# Patient Record
Sex: Female | Born: 1975 | Race: White | Hispanic: Yes | Marital: Married | Smoking: Former smoker
Health system: Southern US, Community
[De-identification: ages and names within clinical notes are randomized; demographics above are authoritative.]

## PROBLEM LIST (undated history)

## (undated) DIAGNOSIS — D509 Iron deficiency anemia, unspecified: Secondary | ICD-10-CM

## (undated) DIAGNOSIS — B019 Varicella without complication: Secondary | ICD-10-CM

## (undated) DIAGNOSIS — Z9289 Personal history of other medical treatment: Secondary | ICD-10-CM

## (undated) DIAGNOSIS — F419 Anxiety disorder, unspecified: Secondary | ICD-10-CM

## (undated) DIAGNOSIS — G4730 Sleep apnea, unspecified: Secondary | ICD-10-CM

## (undated) DIAGNOSIS — D649 Anemia, unspecified: Secondary | ICD-10-CM

## (undated) DIAGNOSIS — M1990 Unspecified osteoarthritis, unspecified site: Secondary | ICD-10-CM

## (undated) DIAGNOSIS — G500 Trigeminal neuralgia: Secondary | ICD-10-CM

## (undated) DIAGNOSIS — F329 Major depressive disorder, single episode, unspecified: Secondary | ICD-10-CM

## (undated) DIAGNOSIS — F32A Depression, unspecified: Secondary | ICD-10-CM

## (undated) HISTORY — DX: Varicella without complication: B01.9

## (undated) HISTORY — PX: ABDOMINAL HYSTERECTOMY: SHX81

---

## 2002-04-12 HISTORY — PX: ROUX-EN-Y GASTRIC BYPASS: SHX1104

## 2005-04-12 HISTORY — PX: CHOLECYSTECTOMY OPEN: SUR202

## 2010-04-12 NOTE — L&D Delivery Note (Signed)
Delivery Note At  a viable female was delivered via NSVD.  APGAR: 8, 9 ; weight 7# 1 oz.   Placenta status: intact.  Cord: 3 VC  with the following complications: none.  Anesthesia:  IV Nubain Lacerations: none Est. Blood Loss (mL):  <325mL  Mom to postpartum.  Baby to nursery-stable.  STINSON, JACOB JEHIEL 12/26/2010, 9:51 PM

## 2010-08-08 ENCOUNTER — Emergency Department (HOSPITAL_COMMUNITY)
Admission: EM | Admit: 2010-08-08 | Discharge: 2010-08-08 | Disposition: A | Discharge status: Home / Self Care | Payer: Medicaid Other | Attending: Emergency Medicine | Admitting: Emergency Medicine

## 2010-08-08 DIAGNOSIS — K089 Disorder of teeth and supporting structures, unspecified: Secondary | ICD-10-CM | POA: Insufficient documentation

## 2010-08-08 DIAGNOSIS — O99891 Other specified diseases and conditions complicating pregnancy: Secondary | ICD-10-CM | POA: Insufficient documentation

## 2010-08-08 DIAGNOSIS — K029 Dental caries, unspecified: Secondary | ICD-10-CM | POA: Insufficient documentation

## 2010-09-13 ENCOUNTER — Emergency Department (HOSPITAL_COMMUNITY): Payer: Medicaid Other

## 2010-09-13 ENCOUNTER — Emergency Department (HOSPITAL_COMMUNITY)
Admission: EM | Admit: 2010-09-13 | Discharge: 2010-09-14 | Disposition: A | Discharge status: Home / Self Care | Payer: Medicaid Other | Attending: Emergency Medicine | Admitting: Emergency Medicine

## 2010-09-13 DIAGNOSIS — O99019 Anemia complicating pregnancy, unspecified trimester: Secondary | ICD-10-CM | POA: Insufficient documentation

## 2010-09-13 DIAGNOSIS — R5381 Other malaise: Secondary | ICD-10-CM | POA: Insufficient documentation

## 2010-09-13 DIAGNOSIS — R42 Dizziness and giddiness: Secondary | ICD-10-CM | POA: Insufficient documentation

## 2010-09-13 DIAGNOSIS — D649 Anemia, unspecified: Secondary | ICD-10-CM | POA: Insufficient documentation

## 2010-09-13 DIAGNOSIS — R55 Syncope and collapse: Secondary | ICD-10-CM | POA: Insufficient documentation

## 2010-09-13 LAB — COMPREHENSIVE METABOLIC PANEL
ALT: 6 U/L (ref 0–35)
Albumin: 2.9 g/dL — ABNORMAL LOW (ref 3.5–5.2)
CO2: 23 mEq/L (ref 19–32)
Calcium: 8.7 mg/dL (ref 8.4–10.5)
GFR calc non Af Amer: >60 mL/min (ref >60)
Glucose, Bld: 104 mg/dL — ABNORMAL HIGH (ref 70–99)
Sodium: 137 mEq/L (ref 135–145)

## 2010-09-13 LAB — DIFFERENTIAL
Basophils Absolute: 0 10*3/uL (ref 0.0–0.1)
Basophils Relative: 0 % (ref 0–1)
Eosinophils Relative: 1 % (ref 0–5)
Monocytes Absolute: 0.7 10*3/uL (ref 0.1–1.0)

## 2010-09-13 LAB — CBC
MCHC: 29.3 g/dL — ABNORMAL LOW (ref 30.0–36.0)
MCV: 66.5 fL — ABNORMAL LOW (ref 78.0–100.0)
Platelets: 278 10*3/uL (ref 150–400)
RDW: 16.7 % — ABNORMAL HIGH (ref 11.5–15.5)

## 2010-09-13 LAB — ABO/RH: ABO/RH(D): A POS

## 2010-09-15 LAB — TYPE AND SCREEN
ABO/RH(D): A POS
Antibody Screen: NEGATIVE
Unit division: 0

## 2010-09-23 ENCOUNTER — Other Ambulatory Visit: Payer: Self-pay | Admitting: Family Medicine

## 2010-09-23 DIAGNOSIS — O99019 Anemia complicating pregnancy, unspecified trimester: Secondary | ICD-10-CM

## 2010-09-23 DIAGNOSIS — O09529 Supervision of elderly multigravida, unspecified trimester: Secondary | ICD-10-CM

## 2010-09-23 LAB — POCT URINALYSIS DIP (DEVICE)
Ketones, ur: NEGATIVE mg/dL
Leukocytes, UA: NEGATIVE
Protein, ur: NEGATIVE mg/dL
Urobilinogen, UA: 0.2 mg/dL (ref 0.0–1.0)

## 2010-09-23 LAB — HIV ANTIBODY (ROUTINE TESTING W REFLEX): HIV: NONREACTIVE

## 2010-09-23 LAB — RPR: RPR: NONREACTIVE

## 2010-09-28 ENCOUNTER — Other Ambulatory Visit: Payer: Medicaid Other

## 2010-09-28 DIAGNOSIS — Z0189 Encounter for other specified special examinations: Secondary | ICD-10-CM

## 2010-10-01 ENCOUNTER — Other Ambulatory Visit: Payer: Medicaid Other

## 2010-10-07 ENCOUNTER — Other Ambulatory Visit: Payer: Self-pay | Admitting: Obstetrics and Gynecology

## 2010-10-07 DIAGNOSIS — O09529 Supervision of elderly multigravida, unspecified trimester: Secondary | ICD-10-CM

## 2010-10-07 LAB — POCT URINALYSIS DIP (DEVICE)
Glucose, UA: NEGATIVE mg/dL
Leukocytes, UA: NEGATIVE
Nitrite: NEGATIVE
Specific Gravity, Urine: 1.015 (ref 1.005–1.030)
Urobilinogen, UA: 0.2 mg/dL (ref 0.0–1.0)

## 2010-10-21 ENCOUNTER — Other Ambulatory Visit: Payer: Self-pay | Admitting: Obstetrics and Gynecology

## 2010-10-21 DIAGNOSIS — O99019 Anemia complicating pregnancy, unspecified trimester: Secondary | ICD-10-CM

## 2010-10-21 LAB — POCT URINALYSIS DIP (DEVICE)
Glucose, UA: NEGATIVE mg/dL
Nitrite: NEGATIVE

## 2010-11-04 ENCOUNTER — Other Ambulatory Visit: Payer: Self-pay | Admitting: Family

## 2010-11-04 ENCOUNTER — Other Ambulatory Visit: Payer: Self-pay | Admitting: Physician Assistant

## 2010-11-04 DIAGNOSIS — O09529 Supervision of elderly multigravida, unspecified trimester: Secondary | ICD-10-CM

## 2010-11-04 DIAGNOSIS — O99019 Anemia complicating pregnancy, unspecified trimester: Secondary | ICD-10-CM

## 2010-11-04 DIAGNOSIS — Z3689 Encounter for other specified antenatal screening: Secondary | ICD-10-CM

## 2010-11-04 LAB — GLUCOSE, CAPILLARY: Glucose-Capillary: 73 mg/dL (ref 70–99)

## 2010-11-04 LAB — POCT URINALYSIS DIP (DEVICE)
Bilirubin Urine: NEGATIVE
Glucose, UA: 100 mg/dL — AB
Ketones, ur: NEGATIVE mg/dL

## 2010-11-04 LAB — WET PREP, GENITAL
Clue Cells Wet Prep HPF POC: NONE SEEN
Trich, Wet Prep: NONE SEEN

## 2010-11-09 ENCOUNTER — Ambulatory Visit (HOSPITAL_COMMUNITY)
Admission: RE | Admit: 2010-11-09 | Discharge: 2010-11-09 | Disposition: A | Discharge status: Home / Self Care | Payer: Medicaid Other | Source: Ambulatory Visit | Attending: Physician Assistant | Admitting: Physician Assistant

## 2010-11-09 DIAGNOSIS — Z363 Encounter for antenatal screening for malformations: Secondary | ICD-10-CM | POA: Insufficient documentation

## 2010-11-09 DIAGNOSIS — O358XX0 Maternal care for other (suspected) fetal abnormality and damage, not applicable or unspecified: Secondary | ICD-10-CM | POA: Insufficient documentation

## 2010-11-09 DIAGNOSIS — Z1389 Encounter for screening for other disorder: Secondary | ICD-10-CM | POA: Insufficient documentation

## 2010-11-09 DIAGNOSIS — O0930 Supervision of pregnancy with insufficient antenatal care, unspecified trimester: Secondary | ICD-10-CM | POA: Insufficient documentation

## 2010-11-09 DIAGNOSIS — Z3689 Encounter for other specified antenatal screening: Secondary | ICD-10-CM

## 2010-11-11 ENCOUNTER — Telehealth: Payer: Self-pay | Admitting: *Deleted

## 2010-11-11 NOTE — Telephone Encounter (Signed)
Patient called stating had dentist appointment tomorrow and needs address to dentist and referral. Telephoned patient and left message to return call.

## 2010-11-18 ENCOUNTER — Other Ambulatory Visit: Payer: Self-pay | Admitting: Obstetrics and Gynecology

## 2010-11-18 DIAGNOSIS — O219 Vomiting of pregnancy, unspecified: Secondary | ICD-10-CM

## 2010-11-18 DIAGNOSIS — O09529 Supervision of elderly multigravida, unspecified trimester: Secondary | ICD-10-CM

## 2010-11-18 LAB — POCT URINALYSIS DIP (DEVICE)
Hgb urine dipstick: NEGATIVE
Leukocytes, UA: NEGATIVE
Nitrite: POSITIVE — AB
Protein, ur: 100 mg/dL — AB
pH: 5.5 (ref 5.0–8.0)

## 2010-11-20 LAB — CULTURE, OB URINE

## 2010-11-25 ENCOUNTER — Other Ambulatory Visit: Payer: Self-pay | Admitting: Obstetrics & Gynecology

## 2010-11-25 ENCOUNTER — Other Ambulatory Visit: Payer: Self-pay | Admitting: Family Medicine

## 2010-11-25 DIAGNOSIS — M259 Joint disorder, unspecified: Secondary | ICD-10-CM

## 2010-11-25 DIAGNOSIS — M899 Disorder of bone, unspecified: Secondary | ICD-10-CM

## 2010-11-25 DIAGNOSIS — O9989 Other specified diseases and conditions complicating pregnancy, childbirth and the puerperium: Secondary | ICD-10-CM

## 2010-11-25 LAB — CBC
MCV: 71.4 fL — ABNORMAL LOW (ref 78.0–100.0)
Platelets: 234 10*3/uL (ref 150–400)
RBC: 3.7 MIL/uL — ABNORMAL LOW (ref 3.87–5.11)
RDW: 18.8 % — ABNORMAL HIGH (ref 11.5–15.5)
WBC: 4.7 10*3/uL (ref 4.0–10.5)

## 2010-11-25 LAB — POCT URINALYSIS DIP (DEVICE)
Bilirubin Urine: NEGATIVE
Glucose, UA: NEGATIVE mg/dL
Hgb urine dipstick: NEGATIVE
Nitrite: NEGATIVE

## 2010-11-25 LAB — GLUCOSE, CAPILLARY: Glucose-Capillary: 136 mg/dL — ABNORMAL HIGH (ref 70–99)

## 2010-11-26 LAB — GC/CHLAMYDIA PROBE AMP, GENITAL
Chlamydia, DNA Probe: NEGATIVE
GC Probe Amp, Genital: NEGATIVE

## 2010-11-27 LAB — STREP B DNA PROBE: GBSP: POSITIVE

## 2010-11-29 ENCOUNTER — Inpatient Hospital Stay (HOSPITAL_COMMUNITY)
Admission: AD | Admit: 2010-11-29 | Discharge: 2010-11-29 | Disposition: A | Discharge status: Home / Self Care | Payer: Medicaid Other | Source: Ambulatory Visit | Attending: Obstetrics & Gynecology | Admitting: Obstetrics & Gynecology

## 2010-11-29 ENCOUNTER — Encounter (HOSPITAL_COMMUNITY): Payer: Self-pay | Admitting: *Deleted

## 2010-11-29 DIAGNOSIS — IMO0002 Reserved for concepts with insufficient information to code with codable children: Secondary | ICD-10-CM | POA: Insufficient documentation

## 2010-11-29 HISTORY — DX: Anemia, unspecified: D64.9

## 2010-11-29 LAB — URINALYSIS, ROUTINE W REFLEX MICROSCOPIC
Bilirubin Urine: NEGATIVE
Ketones, ur: NEGATIVE mg/dL
Leukocytes, UA: NEGATIVE
Nitrite: NEGATIVE
Protein, ur: NEGATIVE mg/dL
Urobilinogen, UA: 0.2 mg/dL (ref 0.0–1.0)
pH: 6 (ref 5.0–8.0)

## 2010-11-29 NOTE — Progress Notes (Signed)
Pt reports left leg started swelling yesterday. Foot feels a littl numb up to her knee.

## 2010-11-29 NOTE — Progress Notes (Signed)
Dr. Elwyn Reach at bedside.  Assessment done and poc discussed with pt.

## 2010-11-29 NOTE — Progress Notes (Signed)
Resident notified of pt arrival and complaint of swelling in right foot.  Will be up to assess pt.

## 2010-11-29 NOTE — ED Provider Notes (Signed)
Chief Complaint:  Leg Swelling   Carrie Douglas is  35 y.o. G6P5005 at [redacted]w[redacted]d presents complaining of Leg Swelling . She states occasional contractions, none currently; denies leaking fluid, vaginal bleeding.  Good fetal movemement. Her right foot has been more swollen than her left for the last 3 days, and in the last day has developed some numbness and tingling of the right toes.  Does consume a lot of salt in her diet.  Denies pain in the right leg, no fevers, or redness of the affected foot.  Does have an aunt who had a blood clot.  Obstetrical/Gynecological History: OB History    Grav Para Term Preterm Abortions TAB SAB Ect Mult Living   6 5 5  0 0 0 0 0 0 5      Past Medical History: Past Medical History  Diagnosis Date  . Anemia     Past Surgical History: Past Surgical History  Procedure Date  . Gastric bypass     Family History: No family history on file.  Social History: History  Substance Use Topics  . Smoking status: Never Smoker   . Smokeless tobacco: Not on file  . Alcohol Use: No    Allergies: No Known Allergies  Prescriptions prior to admission  Medication Sig Dispense Refill  . Cyanocobalamin (VITAMIN B-12 PO) Take 1 tablet by mouth every morning.       . prenatal vitamin w/FE, FA (PRENATAL 1 + 1) 27-1 MG TABS Take 1 tablet by mouth daily.          Review of Systems - Negative except for HPI  Physical Exam   Blood pressure 114/70, pulse 74, temperature 98.3 F (36.8 C), temperature source Oral, resp. rate 18, height 5\' 6"  (1.676 m), weight 238 lb (107.956 kg).  General: General appearance - alert, well appearing, and in no distress Mouth - mucous membranes moist, pharynx normal without lesions Chest - clear to auscultation, no wheezes, rales or rhonchi, symmetric air entry Heart - normal rate, regular rhythm, normal S1, S2, no murmurs, rubs, clicks or gallops Abdomen - soft, nontender, nondistended, gravid (size consistent with dates) Back exam -  no CVA tenderness Extremities - pedal edema is noted bilaterally, worse on right.  Negative Homan's sign. 2+ pedal pulses Baseline: 135 bpm, Variability: Good {> 6 bpm), Accelerations: Reactive and Decelerations: Absent Contractions: none   Labs: Recent Results (from the past 24 hour(s))  URINALYSIS, ROUTINE W REFLEX MICROSCOPIC   Collection Time   11/29/10  7:03 PM      Component Value Range   Color, Urine YELLOW  YELLOW    Appearance CLEAR  CLEAR    Specific Gravity, Urine 1.020  1.005 - 1.030    pH 6.0  5.0 - 8.0    Glucose, UA NEGATIVE  NEGATIVE (mg/dL)   Hgb urine dipstick NEGATIVE  NEGATIVE    Bilirubin Urine NEGATIVE  NEGATIVE    Ketones, ur NEGATIVE  NEGATIVE (mg/dL)   Protein, ur NEGATIVE  NEGATIVE (mg/dL)   Urobilinogen, UA 0.2  0.0 - 1.0 (mg/dL)   Nitrite NEGATIVE  NEGATIVE    Leukocytes, UA NEGATIVE  NEGATIVE    Imaging Studies:  US Ob Detail + 14 Wk  11/09/2010  OBSTETRICAL ULTRASOUND: This exam was performed within a [facility]. The OB US report was generated in the [facility] system, and faxed to the ordering physician.   This report is also available in [employer] and in the [employer]. See [facility] Obstetric  US report.     Assessment: Chau Savell is  35 y.o. G6P5005 at [redacted]w[redacted]d presents with pedal edema, R >L.  Plan: -Likely due to high salt diet and fetal positioning -Discussed decreasing salt intake -instructed her to sit with feet up -instructed her to lay on a L tilt to help distribute baby's weight -Discharge home  BOOTH, ERIN8/19/20127:51 PM

## 2010-11-29 NOTE — Progress Notes (Signed)
Pt presents to [facility] for increased swelling in right foot. Started yesterday but wasn't as bad.  Go worse today and has a tingling feeling.

## 2010-12-02 ENCOUNTER — Other Ambulatory Visit: Payer: Self-pay | Admitting: Family Medicine

## 2010-12-02 DIAGNOSIS — O99019 Anemia complicating pregnancy, unspecified trimester: Secondary | ICD-10-CM

## 2010-12-02 LAB — POCT URINALYSIS DIP (DEVICE)
Leukocytes, UA: NEGATIVE
Nitrite: NEGATIVE
Protein, ur: NEGATIVE mg/dL
Urobilinogen, UA: 0.2 mg/dL (ref 0.0–1.0)
pH: 6.5 (ref 5.0–8.0)

## 2010-12-02 LAB — GLUCOSE, CAPILLARY: Glucose-Capillary: 83 mg/dL (ref 70–99)

## 2010-12-09 ENCOUNTER — Other Ambulatory Visit: Payer: Self-pay | Admitting: Obstetrics and Gynecology

## 2010-12-09 DIAGNOSIS — O99019 Anemia complicating pregnancy, unspecified trimester: Secondary | ICD-10-CM

## 2010-12-09 DIAGNOSIS — O09529 Supervision of elderly multigravida, unspecified trimester: Secondary | ICD-10-CM

## 2010-12-09 DIAGNOSIS — IMO0002 Reserved for concepts with insufficient information to code with codable children: Secondary | ICD-10-CM

## 2010-12-09 LAB — POCT URINALYSIS DIP (DEVICE)
Protein, ur: NEGATIVE mg/dL
Urobilinogen, UA: 1 mg/dL (ref 0.0–1.0)
pH: 6 (ref 5.0–8.0)

## 2010-12-15 DIAGNOSIS — O09529 Supervision of elderly multigravida, unspecified trimester: Secondary | ICD-10-CM

## 2010-12-15 DIAGNOSIS — O99019 Anemia complicating pregnancy, unspecified trimester: Secondary | ICD-10-CM | POA: Insufficient documentation

## 2010-12-15 DIAGNOSIS — N390 Urinary tract infection, site not specified: Secondary | ICD-10-CM | POA: Insufficient documentation

## 2010-12-17 ENCOUNTER — Inpatient Hospital Stay (HOSPITAL_COMMUNITY)
Admission: AD | Admit: 2010-12-17 | Discharge: 2010-12-17 | Disposition: A | Discharge status: Home / Self Care | Payer: Medicaid Other | Source: Ambulatory Visit | Attending: Obstetrics & Gynecology | Admitting: Obstetrics & Gynecology

## 2010-12-17 ENCOUNTER — Encounter (HOSPITAL_COMMUNITY): Payer: Self-pay | Admitting: *Deleted

## 2010-12-17 DIAGNOSIS — N390 Urinary tract infection, site not specified: Secondary | ICD-10-CM

## 2010-12-17 DIAGNOSIS — O99019 Anemia complicating pregnancy, unspecified trimester: Secondary | ICD-10-CM | POA: Insufficient documentation

## 2010-12-17 DIAGNOSIS — O09529 Supervision of elderly multigravida, unspecified trimester: Secondary | ICD-10-CM | POA: Insufficient documentation

## 2010-12-17 DIAGNOSIS — B372 Candidiasis of skin and nail: Secondary | ICD-10-CM | POA: Insufficient documentation

## 2010-12-17 DIAGNOSIS — Z22330 Carrier of Group B streptococcus: Secondary | ICD-10-CM | POA: Insufficient documentation

## 2010-12-17 DIAGNOSIS — D649 Anemia, unspecified: Secondary | ICD-10-CM | POA: Insufficient documentation

## 2010-12-17 DIAGNOSIS — Z0371 Encounter for suspected problem with amniotic cavity and membrane ruled out: Secondary | ICD-10-CM | POA: Insufficient documentation

## 2010-12-17 MED ORDER — NYSTATIN 100000 UNIT/GM EX OINT: TOPICAL_OINTMENT | Freq: Two times a day (BID) | CUTANEOUS | Status: DC

## 2010-12-17 NOTE — ED Provider Notes (Signed)
Carrie Douglas is a 35 y.o. female presenting for r/u SROM. History Patient states had some bloody show last night after sex with husband, and has had continued brownish red streaks in mucous discharge today.  Has been having contractions for the last week or so, stronger today but still spaced out.  No vaginal bleeding.  Good fetal movement.  Also has an itchy rash on her abdomen that has developed over the last several days.  OB History    Grav Para Term Preterm Abortions TAB SAB Ect Mult Living   6 5 5  0 0 0 0 0 0 5     Past Medical History  Diagnosis Date  . Anemia   . Rubella     5 months old  . Varicella     77 1/36 years old   Past Surgical History  Procedure Date  . Gastric bypass   . No past surgeries   . Laparoscopic tubal ligation 2007   Family History: family history includes Cancer in her father. Social History:  reports that she has never smoked. She has never used smokeless tobacco. She reports that she does not drink alcohol or use illicit drugs.  Review of Systems  All other systems reviewed and are negative.    Dilation: 1.5 Effacement (%): 50 Station: -2 Exam by:: Jerelyn Charles  MD Blood pressure 134/83, pulse 84, temperature 98.5 F (36.9 C), temperature source Oral, resp. rate 20, height 5' 4.5" (1.638 m), weight 239 lb 6.4 oz (108.591 kg), SpO2 99.00%. Maternal Exam:  Uterine Assessment: Contraction strength is mild.  Contraction frequency is irregular and rare.   Abdomen: Patient reports no abdominal tenderness. Fetal presentation: vertex  Introitus: Normal vulva. Normal vagina.  Ferning test: negative.   Pelvis: adequate for delivery.   Cervix: Cervix evaluated by digital exam.    FHT: 135, moderate variability, accels present, decels absent  Physical Exam  Constitutional: She is oriented to person, place, and time. She appears well-developed and well-nourished. No distress.  HENT:  Head: Normocephalic and atraumatic.  Eyes: No scleral icterus.    Neck: Normal range of motion. Neck supple.  GI: Soft.       Gravid with size consistent with dates  Musculoskeletal: She exhibits edema (3+ pedal edema).  Neurological: She is alert and oriented to person, place, and time.  Skin: Rash (red patchy rash on abdomen) noted.    Prenatal labs: ABO, Rh: --/--/A POS, A POS (06/03 1845) Antibody: NEG (06/03 1845) Rubella:  immune RPR: Nonreactive (06/13 0000)  HBsAg: NEGATIVE (08/08 0929)  HIV: Non-reactive (06/13 0000)  GBS: POSITIVE (08/15 1914)   Assessment/Plan: 35 year old G6P5005 at 34.2 presenting for rule out SROM and candidal skin rash. -AMA -mutiparity -GBS positive  Not in active labor; will discharge home with labor precautions. Will also provide nystatin ointment for likely candidal skin rash   Patient seen and discussed with Dr. Natale Milch. BOOTH, Shalinda Burkholder 12/17/2010, 7:22 PM

## 2010-12-17 NOTE — Progress Notes (Signed)
Pt states she has had a mucus discharge with some blood streaks. Has contractions about every 8 minutes. Reports good fetal movement.

## 2010-12-18 NOTE — ED Provider Notes (Signed)
I was present for evaluation of this patient. Fern slide negative. Agree with documentation.

## 2010-12-23 ENCOUNTER — Ambulatory Visit (INDEPENDENT_AMBULATORY_CARE_PROVIDER_SITE_OTHER): Payer: Medicaid Other | Admitting: Physician Assistant

## 2010-12-23 ENCOUNTER — Other Ambulatory Visit: Payer: Self-pay | Admitting: Obstetrics and Gynecology

## 2010-12-23 DIAGNOSIS — O480 Post-term pregnancy: Secondary | ICD-10-CM

## 2010-12-23 DIAGNOSIS — O09529 Supervision of elderly multigravida, unspecified trimester: Secondary | ICD-10-CM

## 2010-12-23 LAB — POCT URINALYSIS DIP (DEVICE)
Hgb urine dipstick: NEGATIVE
Ketones, ur: NEGATIVE mg/dL
Specific Gravity, Urine: >=1.03 (ref 1.005–1.030)
pH: 6 (ref 5.0–8.0)

## 2010-12-23 NOTE — Progress Notes (Signed)
Reports increased ctx last night. MAU visit over the weekend cvx: 1.5/thick/vtx. NST today for postdates. Will schedule IOL for next week for postdates.

## 2010-12-23 NOTE — Progress Notes (Signed)
Pressure-groin, pelvic. Pt thinks she lost mucous plug.

## 2010-12-25 ENCOUNTER — Inpatient Hospital Stay (HOSPITAL_COMMUNITY)
Admission: AD | Admit: 2010-12-25 | Discharge: 2010-12-25 | Disposition: A | Discharge status: Home / Self Care | Payer: Medicaid Other | Source: Ambulatory Visit | Attending: Family Medicine | Admitting: Family Medicine

## 2010-12-25 DIAGNOSIS — O479 False labor, unspecified: Secondary | ICD-10-CM | POA: Insufficient documentation

## 2010-12-25 MED ORDER — LACTATED RINGERS IV BOLUS (SEPSIS)
1000.0000 mL | Freq: Once | INTRAVENOUS | Status: AC
  Administered 2010-12-25: 1000 mL via INTRAVENOUS

## 2010-12-25 NOTE — Progress Notes (Signed)
Dr. Elesa Massed reviewed strip during phone call for orders.

## 2010-12-25 NOTE — Progress Notes (Signed)
Pt presents to mau for labor check.  efm and toco placed. Assessing.  

## 2010-12-25 NOTE — Progress Notes (Signed)
Dr. Elesa Massed notified of no cervical change.  Notified of fluid bolus complete with reactive strip and no more decels.  Orders received to dc home.  Follow up with regular scheduled appointment.

## 2010-12-25 NOTE — Progress Notes (Signed)
Difficulty tracing contractions due to pt size.  Contraction frequency per pt statement.

## 2010-12-25 NOTE — Progress Notes (Cosign Needed)
Pt G6 P5 at 40.3wks, contracting every 2-67minx 1-2 hrs.  Denies any problems with pregnancy.

## 2010-12-25 NOTE — Progress Notes (Signed)
Dr. Elesa Massed notified of pt presenting for labor check. Notified of VE.  Notified of fetal decels and repositioning pt.  Orders received for fluids.  See orders.

## 2010-12-26 ENCOUNTER — Encounter (HOSPITAL_COMMUNITY): Payer: Self-pay | Admitting: *Deleted

## 2010-12-26 ENCOUNTER — Inpatient Hospital Stay (HOSPITAL_COMMUNITY)
Admission: AD | Admit: 2010-12-26 | Discharge: 2010-12-28 | DRG: 775 | Disposition: A | Discharge status: Home / Self Care | Payer: Medicaid Other | Source: Ambulatory Visit | Attending: Obstetrics and Gynecology | Admitting: Obstetrics and Gynecology

## 2010-12-26 DIAGNOSIS — O9989 Other specified diseases and conditions complicating pregnancy, childbirth and the puerperium: Secondary | ICD-10-CM

## 2010-12-26 DIAGNOSIS — O99844 Bariatric surgery status complicating childbirth: Secondary | ICD-10-CM

## 2010-12-26 DIAGNOSIS — O09529 Supervision of elderly multigravida, unspecified trimester: Principal | ICD-10-CM | POA: Diagnosis present

## 2010-12-26 DIAGNOSIS — O99892 Other specified diseases and conditions complicating childbirth: Secondary | ICD-10-CM | POA: Diagnosis present

## 2010-12-26 DIAGNOSIS — Z22330 Carrier of Group B streptococcus: Secondary | ICD-10-CM

## 2010-12-26 LAB — CBC
HCT: 24.7 % — ABNORMAL LOW (ref 36.0–46.0)
Hemoglobin: 7.3 g/dL — ABNORMAL LOW (ref 12.0–15.0)
MCH: 21.1 pg — ABNORMAL LOW (ref 26.0–34.0)
MCHC: 29.6 g/dL — ABNORMAL LOW (ref 30.0–36.0)
MCV: 71.4 fL — ABNORMAL LOW (ref 78.0–100.0)
Platelets: 239 10*3/uL (ref 150–400)
RBC: 3.46 MIL/uL — ABNORMAL LOW (ref 3.87–5.11)
RDW: 17.9 % — ABNORMAL HIGH (ref 11.5–15.5)
WBC: 9.8 10*3/uL (ref 4.0–10.5)

## 2010-12-26 MED ORDER — OXYTOCIN 20 UNITS IN LACTATED RINGERS INFUSION - SIMPLE
125.0000 mL/h | Freq: Once | INTRAVENOUS | Status: DC
  Administered 2010-12-26: 500 mL/h via INTRAVENOUS

## 2010-12-26 MED ORDER — LACTATED RINGERS IV SOLN
INTRAVENOUS | Status: DC
  Administered 2010-12-26: 18:00:00 via INTRAVENOUS

## 2010-12-26 MED ORDER — LIDOCAINE HCL (PF) 1 % IJ SOLN
30.0000 mL | INTRAMUSCULAR | Status: DC | PRN
  Filled 2010-12-26 (×2): qty 30

## 2010-12-26 MED ORDER — SODIUM CHLORIDE 0.9 % IV SOLN
2.0000 g | Freq: Once | INTRAVENOUS | Status: DC
  Administered 2010-12-26: 2 g via INTRAVENOUS
  Filled 2010-12-26: qty 2000

## 2010-12-26 MED ORDER — OXYCODONE-ACETAMINOPHEN 5-325 MG PO TABS: 2.0000 | ORAL_TABLET | ORAL | Status: DC | PRN

## 2010-12-26 MED ORDER — ACETAMINOPHEN 325 MG PO TABS: 650.0000 mg | ORAL_TABLET | ORAL | Status: DC | PRN

## 2010-12-26 MED ORDER — MISOPROSTOL 200 MCG PO TABS
ORAL_TABLET | ORAL | Status: AC
  Filled 2010-12-26: qty 5

## 2010-12-26 MED ORDER — FLEET ENEMA 7-19 GM/118ML RE ENEM: 1.0000 | ENEMA | RECTAL | Status: DC | PRN

## 2010-12-26 MED ORDER — OXYTOCIN 10 UNIT/ML IJ SOLN
INTRAMUSCULAR | Status: AC
  Administered 2010-12-26: 20 [IU]
  Filled 2010-12-26: qty 2

## 2010-12-26 MED ORDER — OXYTOCIN BOLUS FROM INFUSION
500.0000 mL | Freq: Once | INTRAVENOUS | Status: DC
  Filled 2010-12-26: qty 500
  Filled 2010-12-26: qty 1000

## 2010-12-26 MED ORDER — IBUPROFEN 600 MG PO TABS
600.0000 mg | ORAL_TABLET | Freq: Four times a day (QID) | ORAL | Status: DC | PRN
  Filled 2010-12-26 (×2): qty 1

## 2010-12-26 MED ORDER — NALBUPHINE SYRINGE 5 MG/0.5 ML
5.0000 mg | INJECTION | INTRAMUSCULAR | Status: DC | PRN
  Administered 2010-12-26: 5 mg via INTRAVENOUS
  Filled 2010-12-26 (×2): qty 0.5

## 2010-12-26 MED ORDER — ONDANSETRON HCL 4 MG/2ML IJ SOLN: 4.0000 mg | Freq: Four times a day (QID) | INTRAMUSCULAR | Status: DC | PRN

## 2010-12-26 MED ORDER — CITRIC ACID-SODIUM CITRATE 334-500 MG/5ML PO SOLN: 30.0000 mL | ORAL | Status: DC | PRN

## 2010-12-26 MED ORDER — SODIUM CHLORIDE 0.9 % IV SOLN
1.0000 g | INTRAVENOUS | Status: DC
  Filled 2010-12-26 (×6): qty 1000

## 2010-12-26 MED ORDER — LACTATED RINGERS IV SOLN: 500.0000 mL | INTRAVENOUS | Status: DC | PRN

## 2010-12-26 NOTE — ED Provider Notes (Signed)
Carrie Douglas is a 35 y.o. female presenting for SROM and contractions. Maternal Medical History:  Reason for admission: Reason for admission: rupture of membranes and contractions.  Contractions: Onset was 1 week ago.   Frequency: regular.   Duration is approximately 30 seconds.   Perceived severity is moderate.    Fetal activity: Perceived fetal activity is normal.   Last perceived fetal movement was within the past hour.    Prenatal complications: No bleeding, HIV, hypertension, infection, pre-eclampsia or preterm labor.   Prenatal Complications - Diabetes: none.    OB History    Grav Para Term Preterm Abortions TAB SAB Ect Mult Living   6 5 5  0 0 0 0 0 0 5     Past Medical History  Diagnosis Date  . Anemia   . Rubella     5 months old  . Varicella     36 1/36 years old   Past Surgical History  Procedure Date  . Gastric bypass   . No past surgeries   . Laparoscopic tubal ligation 2007   Family History: family history includes Cancer in her father. Social History:  reports that she has never smoked. She has never used smokeless tobacco. She reports that she does not drink alcohol or use illicit drugs.  Review of Systems  All other systems reviewed and are negative.    Dilation: 5 Effacement (%): 30 Station: -2 There were no vitals taken for this visit. Maternal Exam:  Uterine Assessment: Contraction strength is moderate.  Contraction duration is 30 seconds. Contraction frequency is regular.   Abdomen: Patient reports no abdominal tenderness. Fundal height is size equals dates.   Estimated fetal weight is 7.5 lbs.   Fetal presentation: vertex  Introitus: Vulva is negative for condylomata, edema, lesion, piercings and varicosities.  Ferning test: not done.  Nitrazine test: not done. Amniotic fluid character: clear.  Pelvis: adequate for delivery.   Cervix: Cervix evaluated by digital exam.     Fetal Exam Fetal Monitor Review: Mode: hand-held doppler probe.    Variability: moderate (6-25 bpm).   Pattern: accelerations present and no decelerations.    Fetal State Assessment: Category I - tracings are normal.     Physical Exam  Constitutional: She appears well-developed and well-nourished.  HENT:  Head: Normocephalic and atraumatic.  Eyes: Pupils are equal, round, and reactive to light.  Neck: Normal range of motion.  Cardiovascular: Normal rate and regular rhythm.   Respiratory: Effort normal and breath sounds normal.  GI: Soft. Bowel sounds are normal.  Genitourinary: Vulva exhibits no lesion.    Prenatal labs: ABO, Rh: --/--/A POS, A POS (06/03 1845) Antibody: NEG (06/03 1845) Rubella:   RPR: Nonreactive (06/13 0000)  HBsAg: NEGATIVE (08/08 0929)  HIV: Non-reactive (06/13 0000)  GBS: POSITIVE (08/15 0916)   Assessment/Plan: 1.  Labor Will admit patient and support pt during labor. 2.  GBS - will start with ampicillin due to history of rapid delivery  STINSON, JACOB JEHIEL 12/26/2010, 5:13 PM

## 2010-12-26 NOTE — Plan of Care (Signed)
Problem: Consults Goal: Birthing Suites Patient Information Press F2 to bring up selections list  Outcome: Completed/Met Date Met:  12/26/10  Pt > [redacted] weeks EGA

## 2010-12-26 NOTE — Progress Notes (Signed)
Subjective: Pt admitted for SROM and contractions.  Doing well with nubain.  Comfortable.  Objective: BP 138/68  Pulse 70  Temp(Src) 98.3 F (36.8 C) (Oral)  Resp 20  Ht 5\' 6"  (1.676 m)  Wt 108.863 kg (240 lb)  BMI 38.74 kg/m2      FHT:  FHR: 120s bpm, variability: moderate,  accelerations:  Abscent,  decelerations:  Present lates UC:   regular, every 3 minutes SVE:   Dilation: 5 Effacement (%): 30 Station: -2  Labs: Lab Results  Component Value Date   WBC 9.8 12/26/2010   HGB 7.3* 12/26/2010   HCT 24.7* 12/26/2010   MCV 71.4* 12/26/2010   PLT 239 12/26/2010    Assessment / Plan: Spontaneous labor, progressing normally ROM - meconium Labor: Progressing normally Preeclampsia:  n/a Fetal Wellbeing:  Category II Pain Control:  nubain I/D:  Ampicillin for GBS Anticipated MOD:  NSVD  STINSON, JACOB JEHIEL 12/26/2010, 9:14 PM

## 2010-12-26 NOTE — H&P (Signed)
Carrie Douglas is a 35 y.o. female presenting for SROM and contractions. Maternal Medical History:  Reason for admission: Reason for admission: rupture of membranes and contractions.  Contractions: Onset was 1 week ago.   Frequency: regular.   Duration is approximately 30 seconds.   Perceived severity is moderate.    Fetal activity: Perceived fetal activity is normal.   Last perceived fetal movement was within the past hour.    Prenatal complications: No bleeding, HIV, hypertension, infection, pre-eclampsia or preterm labor.   Prenatal Complications - Diabetes: none.    OB History    Grav Para Term Preterm Abortions TAB SAB Ect Mult Living   6 5 5 0 0 0 0 0 0 5     Past Medical History  Diagnosis Date  . Anemia   . Rubella     5 months old  . Varicella     1 1/36 years old   Past Surgical History  Procedure Date  . Gastric bypass   . No past surgeries   . Laparoscopic tubal ligation 2007   Family History: family history includes Cancer in her father. Social History:  reports that she has never smoked. She has never used smokeless tobacco. She reports that she does not drink alcohol or use illicit drugs.  Review of Systems  All other systems reviewed and are negative.    Dilation: 5 Effacement (%): 30 Station: -2 There were no vitals taken for this visit. Maternal Exam:  Uterine Assessment: Contraction strength is moderate.  Contraction duration is 30 seconds. Contraction frequency is regular.   Abdomen: Patient reports no abdominal tenderness. Fundal height is size equals dates.   Estimated fetal weight is 7.5 lbs.   Fetal presentation: vertex  Introitus: Vulva is negative for condylomata, edema, lesion, piercings and varicosities.  Ferning test: not done.  Nitrazine test: not done. Amniotic fluid character: clear.  Pelvis: adequate for delivery.   Cervix: Cervix evaluated by digital exam.     Fetal Exam Fetal Monitor Review: Mode: hand-held doppler probe.    Variability: moderate (6-25 bpm).   Pattern: accelerations present and no decelerations.    Fetal State Assessment: Category I - tracings are normal.     Physical Exam  Constitutional: She appears well-developed and well-nourished.  HENT:  Head: Normocephalic and atraumatic.  Eyes: Pupils are equal, round, and reactive to light.  Neck: Normal range of motion.  Cardiovascular: Normal rate and regular rhythm.   Respiratory: Effort normal and breath sounds normal.  GI: Soft. Bowel sounds are normal.  Genitourinary: Vulva exhibits no lesion.    Prenatal labs: ABO, Rh: --/--/A POS, A POS (06/03 1845) Antibody: NEG (06/03 1845) Rubella:   RPR: Nonreactive (06/13 0000)  HBsAg: NEGATIVE (08/08 0929)  HIV: Non-reactive (06/13 0000)  GBS: POSITIVE (08/15 0916)   Assessment/Plan: 1.  Labor Will admit patient and support pt during labor. 2.  GBS - will start with ampicillin due to history of rapid delivery  Minette Manders JEHIEL 12/26/2010, 5:13 PM 

## 2010-12-26 NOTE — Progress Notes (Signed)
Dr. Adrian Blackwater attempted Fetal Scalp electrode x 2 without success.  Successful placement by Scheryl Marten, RN at this time.

## 2010-12-26 NOTE — ED Notes (Signed)
Dr Adrian Blackwater at bedside, SVE done

## 2010-12-26 NOTE — Progress Notes (Signed)
Pt states, " I've had regular contractions since 3 pm and I began leaking at 4 pm."

## 2010-12-27 ENCOUNTER — Encounter (HOSPITAL_COMMUNITY): Payer: Self-pay

## 2010-12-27 LAB — CBC
HCT: 21.3 % — ABNORMAL LOW (ref 36.0–46.0)
Hemoglobin: 6.2 g/dL — CL (ref 12.0–15.0)
MCH: 20.6 pg — ABNORMAL LOW (ref 26.0–34.0)
MCV: 70.8 fL — ABNORMAL LOW (ref 78.0–100.0)
Platelets: 204 10*3/uL (ref 150–400)
RBC: 3.01 MIL/uL — ABNORMAL LOW (ref 3.87–5.11)

## 2010-12-27 MED ORDER — DIBUCAINE 1 % RE OINT: 1.0000 "application " | TOPICAL_OINTMENT | RECTAL | Status: DC | PRN

## 2010-12-27 MED ORDER — BENZOCAINE-MENTHOL 20-0.5 % EX AERO: 1.0000 "application " | INHALATION_SPRAY | CUTANEOUS | Status: DC | PRN

## 2010-12-27 MED ORDER — DIPHENHYDRAMINE HCL 25 MG PO CAPS: 25.0000 mg | ORAL_CAPSULE | Freq: Four times a day (QID) | ORAL | Status: DC | PRN

## 2010-12-27 MED ORDER — PRENATAL PLUS 27-1 MG PO TABS
1.0000 | ORAL_TABLET | Freq: Every day | ORAL | Status: DC
  Administered 2010-12-27 – 2010-12-28 (×2): 1 via ORAL
  Filled 2010-12-27 (×2): qty 1

## 2010-12-27 MED ORDER — METHYLERGONOVINE MALEATE 0.2 MG/ML IJ SOLN: 0.2000 mg | INTRAMUSCULAR | Status: DC | PRN

## 2010-12-27 MED ORDER — BENZOCAINE-MENTHOL 20-0.5 % EX AERO
INHALATION_SPRAY | CUTANEOUS | Status: AC
  Filled 2010-12-27: qty 56

## 2010-12-27 MED ORDER — LACTATED RINGERS IV SOLN: INTRAVENOUS | Status: DC

## 2010-12-27 MED ORDER — METHYLERGONOVINE MALEATE 0.2 MG PO TABS: 0.2000 mg | ORAL_TABLET | ORAL | Status: DC | PRN

## 2010-12-27 MED ORDER — ZOLPIDEM TARTRATE 5 MG PO TABS: 5.0000 mg | ORAL_TABLET | Freq: Every evening | ORAL | Status: DC | PRN

## 2010-12-27 MED ORDER — SODIUM CHLORIDE 0.9 % IV SOLN
INTRAVENOUS | Status: DC
  Administered 2010-12-27: 13:00:00 via INTRAVENOUS

## 2010-12-27 MED ORDER — LANOLIN HYDROUS EX OINT: TOPICAL_OINTMENT | CUTANEOUS | Status: DC | PRN

## 2010-12-27 MED ORDER — TETANUS-DIPHTH-ACELL PERTUSSIS 5-2.5-18.5 LF-MCG/0.5 IM SUSP
0.5000 mL | Freq: Once | INTRAMUSCULAR | Status: AC
  Administered 2010-12-27: 0.5 mL via INTRAMUSCULAR
  Filled 2010-12-27: qty 0.5

## 2010-12-27 MED ORDER — ONDANSETRON HCL 4 MG/2ML IJ SOLN: 4.0000 mg | INTRAMUSCULAR | Status: DC | PRN

## 2010-12-27 MED ORDER — VITAMIN B-12 1000 MCG PO TABS
1000.0000 ug | ORAL_TABLET | Freq: Every day | ORAL | Status: DC
  Administered 2010-12-27 – 2010-12-28 (×2): 1000 ug via ORAL
  Filled 2010-12-27 (×2): qty 1

## 2010-12-27 MED ORDER — SIMETHICONE 80 MG PO CHEW: 80.0000 mg | CHEWABLE_TABLET | ORAL | Status: DC | PRN

## 2010-12-27 MED ORDER — OXYCODONE-ACETAMINOPHEN 5-325 MG PO TABS
1.0000 | ORAL_TABLET | ORAL | Status: DC | PRN
  Administered 2010-12-28: 1 via ORAL
  Filled 2010-12-27 (×2): qty 1

## 2010-12-27 MED ORDER — FERUMOXYTOL INJECTION 510 MG/17 ML
510.0000 mg | Freq: Once | INTRAVENOUS | Status: AC
  Administered 2010-12-27: 510 mg via INTRAVENOUS
  Filled 2010-12-27: qty 17

## 2010-12-27 MED ORDER — ONDANSETRON HCL 4 MG PO TABS: 4.0000 mg | ORAL_TABLET | ORAL | Status: DC | PRN

## 2010-12-27 MED ORDER — WITCH HAZEL-GLYCERIN EX PADS: 1.0000 "application " | MEDICATED_PAD | CUTANEOUS | Status: DC | PRN

## 2010-12-27 MED ORDER — NA FERRIC GLUC CPLX IN SUCROSE 12.5 MG/ML IV SOLN: 125.0000 mg | Freq: Once | INTRAVENOUS | Status: DC

## 2010-12-27 MED ORDER — IBUPROFEN 600 MG PO TABS
600.0000 mg | ORAL_TABLET | Freq: Four times a day (QID) | ORAL | Status: DC
  Administered 2010-12-27 – 2010-12-28 (×6): 600 mg via ORAL
  Filled 2010-12-27 (×4): qty 1

## 2010-12-27 MED ORDER — SENNOSIDES-DOCUSATE SODIUM 8.6-50 MG PO TABS
2.0000 | ORAL_TABLET | Freq: Every day | ORAL | Status: DC
  Administered 2010-12-27: 2 via ORAL

## 2010-12-27 NOTE — Progress Notes (Signed)
Post Partum Day 1 Subjective: no complaints, up ad lib, voiding and tolerating PO  Objective: Blood pressure 120/70, pulse 76, temperature 98.6 F (37 C), temperature source Oral, resp. rate 18, height 5\' 6"  (1.676 m), weight 108.863 kg (240 lb), unknown if currently breastfeeding.  Physical Exam:  General: alert, cooperative, appears stated age and no distress Lochia: appropriate Uterine Fundus: firm Incision: n/a DVT Evaluation: No evidence of DVT seen on physical exam. Negative Homan's sign. No cords or calf tenderness. No significant calf/ankle edema.   Basename 12/27/10 0519 12/26/10 1800  HGB 6.2* 7.3*  HCT 21.3* 24.7*    Assessment/Plan: Plan for discharge tomorrow   LOS: 1 day   Carrie Douglas 12/27/2010, 8:52 AM

## 2010-12-28 ENCOUNTER — Other Ambulatory Visit: Payer: Medicaid Other

## 2010-12-28 MED ORDER — IBUPROFEN 600 MG PO TABS: 600.0000 mg | ORAL_TABLET | Freq: Four times a day (QID) | ORAL | Status: AC

## 2010-12-28 MED ORDER — DOCUSATE SODIUM 100 MG PO CAPS: 100.0000 mg | ORAL_CAPSULE | Freq: Two times a day (BID) | ORAL | Status: AC

## 2010-12-28 NOTE — Discharge Summary (Signed)
Obstetric Discharge Summary Reason for Admission: onset of labor Prenatal Procedures: NST Intrapartum Procedures: spontaneous vaginal delivery Postpartum Procedures: none Complications-Operative and Postpartum: anemia Hemoglobin  Date Value Range Status  12/27/2010 6.2* 12.0-15.0 (g/dL) Final     DELTA CHECK NOTED     REPEATED TO VERIFY     CRITICAL RESULT CALLED TO, READ BACK BY AND VERIFIED WITH:     N HALLIS 12/27/10 AT 0548 BY H SOEWARDIMAN     HCT  Date Value Range Status  12/27/2010 21.3* 36.0-46.0 (%) Final    Discharge Diagnoses: Term Pregnancy-delivered  Discharge Information: Date: 12/28/2010 Activity: pelvic rest Diet: routine Medications: PNV, Ibuprophen, Colace and Iron Condition: stable Instructions: refer to practice specific booklet Discharge to: home   Newborn Data: Live born female  Birth Weight: 7 lb 1.4 oz (3215 g) APGAR: 8, 9  Home with mother.  Sandara Tyree, Beverely Pace A 12/28/2010, 7:31 AM

## 2010-12-28 NOTE — Progress Notes (Signed)
UR chart review completed.  

## 2010-12-30 ENCOUNTER — Inpatient Hospital Stay (HOSPITAL_COMMUNITY): Admission: RE | Admit: 2010-12-30 | Payer: Medicaid Other | Source: Ambulatory Visit

## 2011-02-03 ENCOUNTER — Encounter: Payer: Self-pay | Admitting: Advanced Practice Midwife

## 2011-02-03 ENCOUNTER — Ambulatory Visit (INDEPENDENT_AMBULATORY_CARE_PROVIDER_SITE_OTHER): Payer: Medicaid Other | Admitting: Advanced Practice Midwife

## 2011-02-03 DIAGNOSIS — Z3049 Encounter for surveillance of other contraceptives: Secondary | ICD-10-CM

## 2011-02-03 MED ORDER — MEDROXYPROGESTERONE ACETATE 150 MG/ML IM SUSP
150.0000 mg | Freq: Once | INTRAMUSCULAR | Status: AC
  Administered 2011-02-03: 150 mg via INTRAMUSCULAR

## 2011-02-03 NOTE — Progress Notes (Signed)
  Subjective:     Carrie Douglas is a 35 y.o. female who presents for a postpartum visit. She is 6 weeks postpartum following a spontaneous vaginal delivery. I have fully reviewed the prenatal and intrapartum course. The delivery was at 40 gestational weeks. Outcome: spontaneous vaginal delivery. Anesthesia: none. Postpartum course has been uneventful. Baby's course has been uneventful. Baby is feeding by both breast and bottle - unknown. Bleeding staining only. Bowel function is normal. Bladder function is normal. Patient is not sexually active. Contraception method is Depo-Provera injections. Postpartum depression screening: negative.  The following portions of the patient's history were reviewed and updated as appropriate: allergies, current medications, past family history, past medical history, past social history, past surgical history and problem list.  Review of Systems Pertinent items are noted in HPI.   Objective:    BP 118/79  Pulse 80  Temp 98.9 F (37.2 C)  Ht 5\' 6"  (1.676 m)  Wt 211 lb 1.6 oz (95.754 kg)  BMI 34.07 kg/m2  Breastfeeding? Yes  General:  alert, no distress and moderately obese   Breasts:  inspection negative, no nipple discharge or bleeding, no masses or nodularity palpable  Lungs: deferred  Heart:  def  Abdomen: soft, non-tender; bowel sounds normal; no masses,  no organomegaly   Vulva:  normal  Vagina: normal vagina  Cervix:  no lesions  Corpus: normal and normal size, contour, position, consistency, mobility, non-tender  Adnexa:  normal adnexa  Rectal Exam: deferred        Assessment:    Normal  postpartum exam. Pap smear not done at today's visit. Wants to schedule for another time.  Plan:    1. Contraception: Depo-Provera injections 2. Wants info on Mirena 3. Follow up in: 4 weeks or as needed.

## 2011-02-03 NOTE — Patient Instructions (Addendum)

## 2011-02-04 LAB — POCT PREGNANCY, URINE: Preg Test, Ur: NEGATIVE

## 2011-03-11 ENCOUNTER — Ambulatory Visit: Payer: Medicaid Other | Admitting: Advanced Practice Midwife

## 2011-04-29 ENCOUNTER — Telehealth: Payer: Self-pay | Admitting: *Deleted

## 2011-04-29 NOTE — Telephone Encounter (Signed)
Pt left message stating that her medicaid has expired. She wants information on how to get the shot cheaper now that she has no insurance.

## 2011-04-30 NOTE — Telephone Encounter (Signed)
Called pt and left message to return our call to the clinics.  

## 2011-05-03 ENCOUNTER — Ambulatory Visit: Payer: Medicaid Other

## 2011-05-03 NOTE — Telephone Encounter (Signed)
Called pt and left message stated that this is our second attempt and if she has any further questions, comments, and concerns to please call our nurse call back line.

## 2011-08-02 ENCOUNTER — Encounter (HOSPITAL_COMMUNITY): Payer: Self-pay | Admitting: *Deleted

## 2011-08-02 ENCOUNTER — Emergency Department (HOSPITAL_COMMUNITY)
Admission: EM | Admit: 2011-08-02 | Discharge: 2011-08-02 | Disposition: A | Discharge status: Home / Self Care | Payer: Medicaid Other | Attending: Emergency Medicine | Admitting: Emergency Medicine

## 2011-08-02 ENCOUNTER — Emergency Department (HOSPITAL_COMMUNITY): Payer: Medicaid Other

## 2011-08-02 DIAGNOSIS — R51 Headache: Secondary | ICD-10-CM | POA: Insufficient documentation

## 2011-08-02 DIAGNOSIS — G500 Trigeminal neuralgia: Secondary | ICD-10-CM | POA: Insufficient documentation

## 2011-08-02 DIAGNOSIS — H9209 Otalgia, unspecified ear: Secondary | ICD-10-CM | POA: Insufficient documentation

## 2011-08-02 LAB — COMPREHENSIVE METABOLIC PANEL
Albumin: 4 g/dL (ref 3.5–5.2)
Alkaline Phosphatase: 78 U/L (ref 39–117)
BUN: 14 mg/dL (ref 6–23)
Calcium: 9.3 mg/dL (ref 8.4–10.5)
Creatinine, Ser: 0.87 mg/dL (ref 0.50–1.10)
Potassium: 4.2 mEq/L (ref 3.5–5.1)
Total Protein: 7.1 g/dL (ref 6.0–8.3)

## 2011-08-02 LAB — CBC
HCT: 33.1 % — ABNORMAL LOW (ref 36.0–46.0)
MCH: 24.4 pg — ABNORMAL LOW (ref 26.0–34.0)
MCHC: 31.7 g/dL (ref 30.0–36.0)
RDW: 13.9 % (ref 11.5–15.5)

## 2011-08-02 MED ORDER — CARBAMAZEPINE 200 MG PO TABS: 200.0000 mg | ORAL_TABLET | Freq: Two times a day (BID) | ORAL | Status: DC

## 2011-08-02 MED ORDER — CARBAMAZEPINE 200 MG PO TABS
200.0000 mg | ORAL_TABLET | Freq: Once | ORAL | Status: AC
  Administered 2011-08-02: 200 mg via ORAL
  Filled 2011-08-02: qty 1

## 2011-08-02 NOTE — ED Provider Notes (Signed)
History     CSN: [id number]  Arrival date & time 08/02/11  1853   None     Chief Complaint  Patient presents with  . Headache    (Consider location/radiation/quality/duration/timing/severity/associated sxs/prior treatment) HPI Comments: Patient reports, that for the last year.  She has had excruciating electric-like sharp pain around her right ear that shoots to the corner of her eye and into her jaw.  She has had dental extractions, thinking that this was a tooth problem.  She has seen.  No primary care physician.  Other than the dentist for this.  She states that the pain is exacerbated by a breeze, touch, certain activities and cold temperatures.   Patient is a 36 y.o. female presenting with headaches. The history is provided by the patient.  Headache  This is a recurrent problem. The current episode started more than 1 week ago. The problem occurs every few minutes. The problem has been gradually worsening. The headache is associated with an unknown factor. The pain is located in the right unilateral region. The quality of the pain is described as throbbing. The pain is at a severity of 5/10. The pain is moderate. The pain radiates to the face. Pertinent negatives include no fever.    Past Medical History  Diagnosis Date  . Rubella     5 months old  . Varicella     59 1/36 years old  . Anemia     blood transfusion 09/14/10    Past Surgical History  Procedure Date  . Gastric bypass   . No past surgeries   . Laparoscopic tubal ligation 2007    Family History  Problem Relation Age of Onset  . Cancer Father     History  Substance Use Topics  . Smoking status: Never Smoker   . Smokeless tobacco: Never Used  . Alcohol Use: No    OB History    Grav Para Term Preterm Abortions TAB SAB Ect Mult Living   6 6 6  0 0 0 0 0 0 6      Review of Systems  Constitutional: Negative for fever.  HENT: Positive for ear pain. Negative for hearing loss, neck pain, neck stiffness,  dental problem and ear discharge.   Neurological: Positive for headaches. Negative for dizziness, seizures, facial asymmetry, speech difficulty, weakness and numbness.    Allergies  Review of patient's allergies indicates no known allergies.  Home Medications   Current Outpatient Rx  Name Route Sig Dispense Refill  . FERROUS GLUCONATE 325 MG PO TABS Oral Take 325 mg by mouth daily with breakfast.      . ADULT MULTIVITAMIN W/MINERALS CH Oral Take 1 tablet by mouth daily.    Marland Kitchen NAPROXEN SODIUM 220 MG PO TABS Oral Take 440 mg by mouth 2 (two) times daily with a meal.    . CARBAMAZEPINE 200 MG PO TABS Oral Take 1 tablet (200 mg total) by mouth 2 (two) times daily. 90 tablet 0    200 mg BID fro 7 days than increase to 400 mg BID  .Marland KitchenMarland Kitchen    BP 117/73  Pulse 64  Temp(Src) 98.7 F (37.1 C) (Oral)  Resp 16  SpO2 98%  LMP 07/19/2011  Physical Exam  Constitutional: She is oriented to person, place, and time. She appears well-developed and well-nourished.  HENT:  Head: Normocephalic and atraumatic.  Right Ear: Hearing, tympanic membrane and ear canal normal. No lacerations. There is tenderness. No mastoid tenderness. Tympanic membrane is not injected. No  middle ear effusion. No hemotympanum.  Left Ear: Hearing, tympanic membrane, external ear and ear canal normal.  Neck: Normal range of motion.  Cardiovascular: Normal rate.   Pulmonary/Chest: Effort normal.  Musculoskeletal: Normal range of motion.  Neurological: She is alert and oriented to person, place, and time. No cranial nerve deficit.  Skin: Skin is warm and dry. No rash noted.    ED Course  Procedures (including critical care time)  Labs Reviewed  CBC - Abnormal; Notable for the following:    Hemoglobin 10.5 (*)    HCT 33.1 (*)    MCV 76.8 (*)    MCH 24.4 (*)    All other components within normal limits  COMPREHENSIVE METABOLIC PANEL - Abnormal; Notable for the following:    Total Bilirubin 0.1 (*)    GFR calc non Af Amer  85 (*)    All other components within normal limits   Ct Head Wo Contrast  08/02/2011  *RADIOLOGY REPORT*  Clinical Data: Headache  CT HEAD WITHOUT CONTRAST  Technique:  Contiguous axial images were obtained from the base of the skull through the vertex without contrast.  Comparison: None.  Findings: No acute hemorrhage, acute infarction, or mass lesion is identified.  No midline shift.  No ventriculomegaly.  No skull fracture.  Orbits and paranasal sinuses are intact.  IMPRESSION: Normal exam.  Original Report Authenticated By: Harrel Lemon, M.D.     1. Trigeminal neuralgia syndrome       MDM  After physical exam and careful history taking, feel this patient has a trigeminal neuralgia.  We'll get baseline labs head CT.  If these are all within normal limits.  Will start patient on Tegretol and refer to neurology        Arman Filter, NP 08/02/11 2205  Arman Filter, NP 08/02/11 2205

## 2011-08-02 NOTE — ED Notes (Signed)
The pt has had a rt earache for one year.  The pain is worse for one week and since 0900am today.  The pain is causing her head to pound

## 2011-08-02 NOTE — ED Notes (Signed)
Pt waiting for c-t 

## 2011-08-02 NOTE — ED Notes (Signed)
States pain is more in right ear than head

## 2011-08-02 NOTE — ED Notes (Signed)
To ED for eval of right side HA. States she has had this HA for the past yr intermittently. No vomiting. OTC meds taken with relief.

## 2011-08-02 NOTE — Discharge Instructions (Signed)
Trigeminal Neuralgia Trigeminal neuralgia is a nerve disorder that causes sudden attacks of severe facial pain. It is caused by damage to the trigeminal nerve, a major nerve in the face. It is more common in women and in the elderly, although it can also happen in younger patients. Attacks last from a few seconds to several minutes and can occur from a couple of times per year to several times per day. Trigeminal neuralgia can be a very distressing and disabling condition. Surgery may be needed in very severe cases if medical treatment does not give relief. HOME CARE INSTRUCTIONS   If your caregiver prescribed medication to help prevent attacks, take as directed.   To help prevent attacks:   Chew on the unaffected side of the mouth.   Avoid touching your face.   Avoid blasts of hot or cold air.   Men may wish to grow a beard to avoid having to shave.  SEEK IMMEDIATE MEDICAL CARE IF:  Pain is unbearable and your medicine does not help.   You develop new, unexplained symptoms (problems).   You have problems that may be related to a medication you are taking.  Document Released: 03/26/2000 Document Revised: 03/18/2011 Document Reviewed: 01/24/2009 [facility] Patient Information 2012 [place], [place]. Please followup with [facility]: Make an appointment, stating that you were seen in the emergency room and were referred

## 2011-08-03 NOTE — ED Provider Notes (Signed)
Medical screening examination/treatment/procedure(s) were performed by non-physician practitioner and as supervising physician I was immediately available for consultation/collaboration.   Stepanie Graver, MD 08/03/11 0130 

## 2011-08-16 ENCOUNTER — Other Ambulatory Visit: Payer: Self-pay | Admitting: Neurology

## 2011-08-16 DIAGNOSIS — G500 Trigeminal neuralgia: Secondary | ICD-10-CM

## 2011-08-16 DIAGNOSIS — H02409 Unspecified ptosis of unspecified eyelid: Secondary | ICD-10-CM

## 2011-08-18 ENCOUNTER — Ambulatory Visit
Admission: RE | Admit: 2011-08-18 | Discharge: 2011-08-18 | Disposition: A | Discharge status: Home / Self Care | Payer: Medicaid Other | Source: Ambulatory Visit | Attending: Neurology | Admitting: Neurology

## 2011-08-18 DIAGNOSIS — G500 Trigeminal neuralgia: Secondary | ICD-10-CM

## 2011-08-18 DIAGNOSIS — H02409 Unspecified ptosis of unspecified eyelid: Secondary | ICD-10-CM

## 2012-06-14 IMAGING — US US OB DETAIL+14 WK
2 series · 12 of 28 positions shown · non-contrast
Comparison: none

[Series 1: us ob detail +14 wk · 8 acquisitions, 1 frame shown (1 of 2)]
[im 8/8]
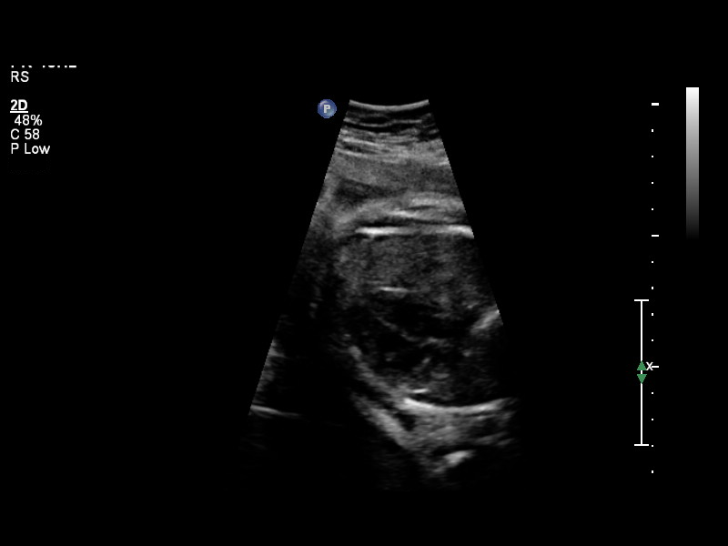

[Series 1: us ob detail +14 wk · 11 of 89 slices shown (2 of 2)]
[im 4/89]
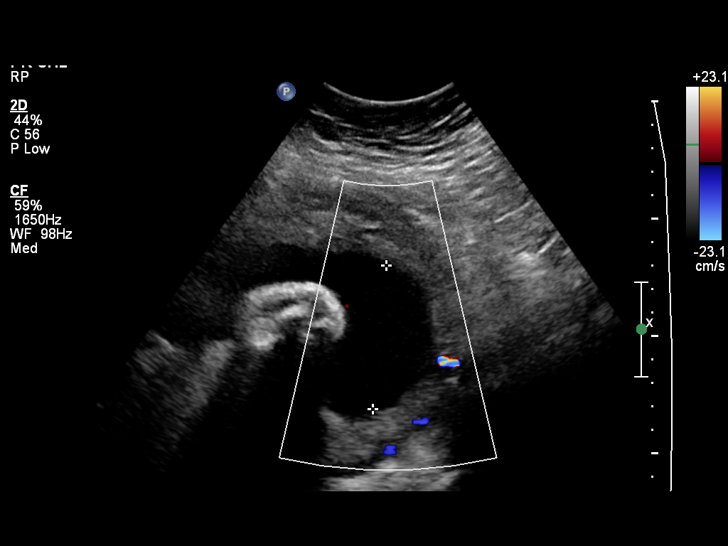
[im 11/89]
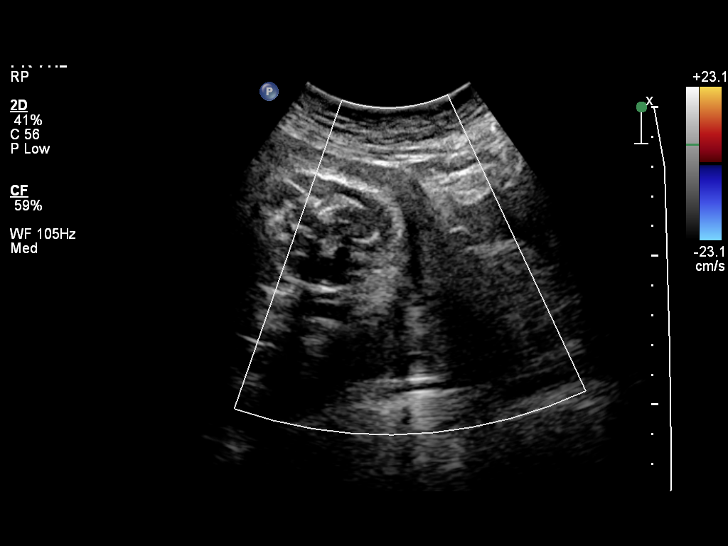
[im 22/89]
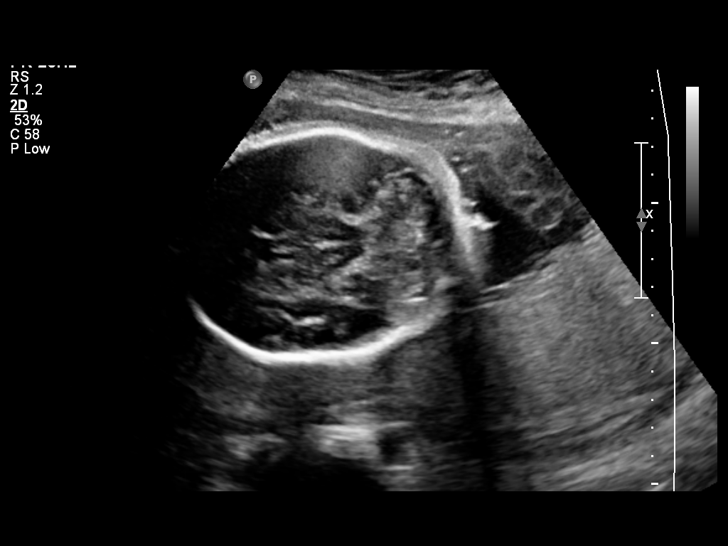
[im 29/89]
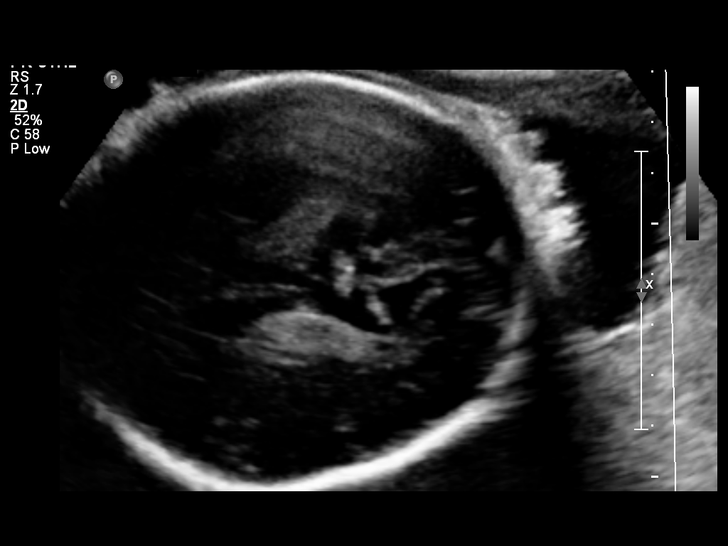
[im 36/89]
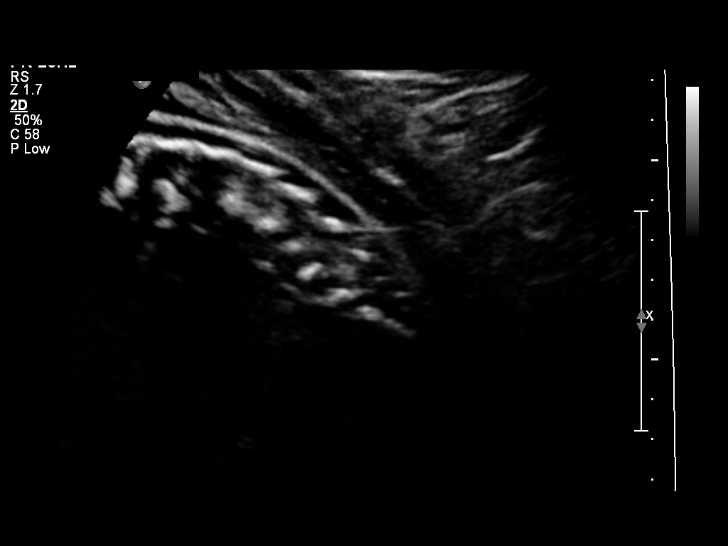
[im 46/89]
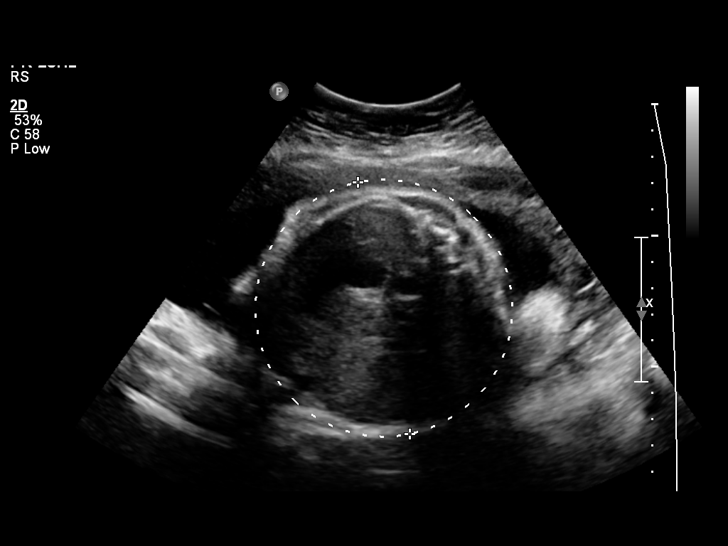
[im 53/89]
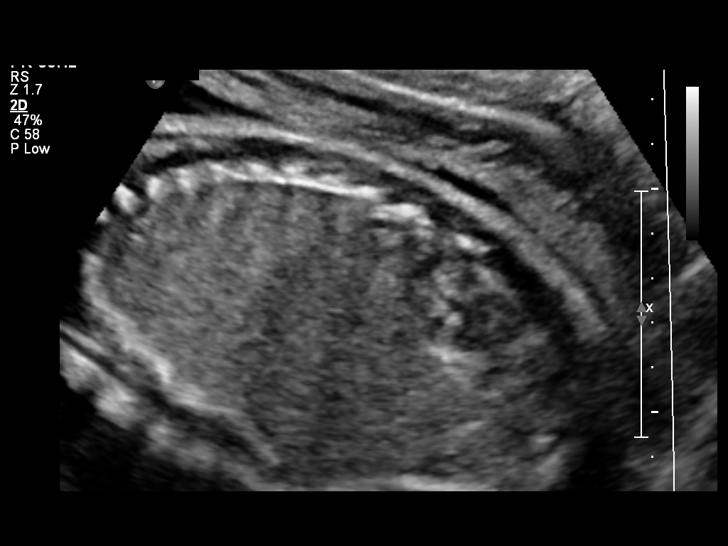
[im 60/89]
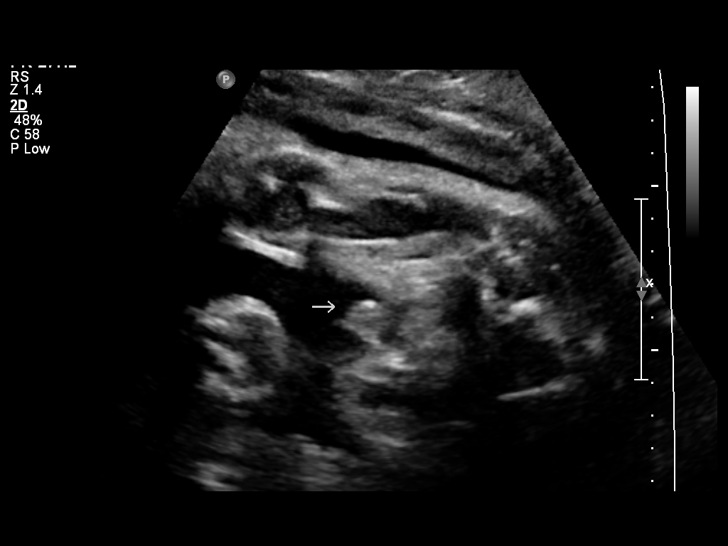
[im 71/89]
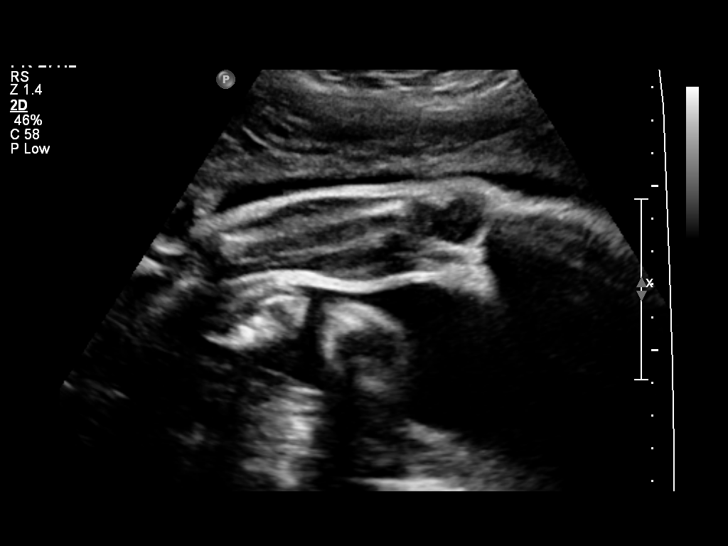
[im 78/89]
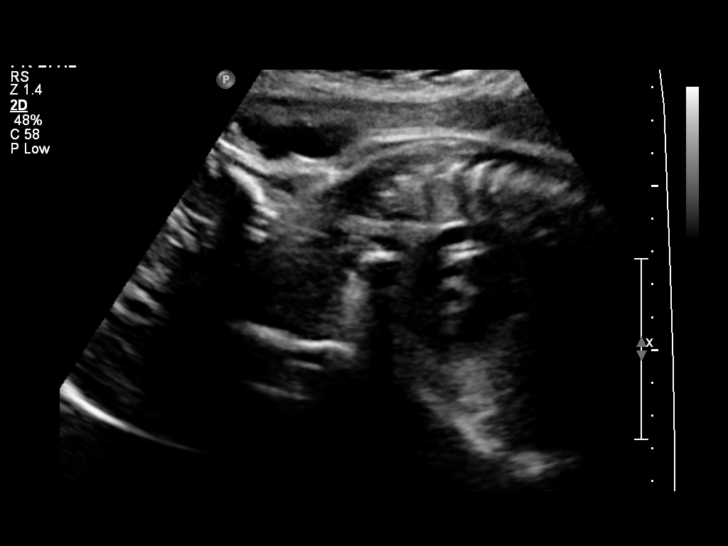
[im 85/89]
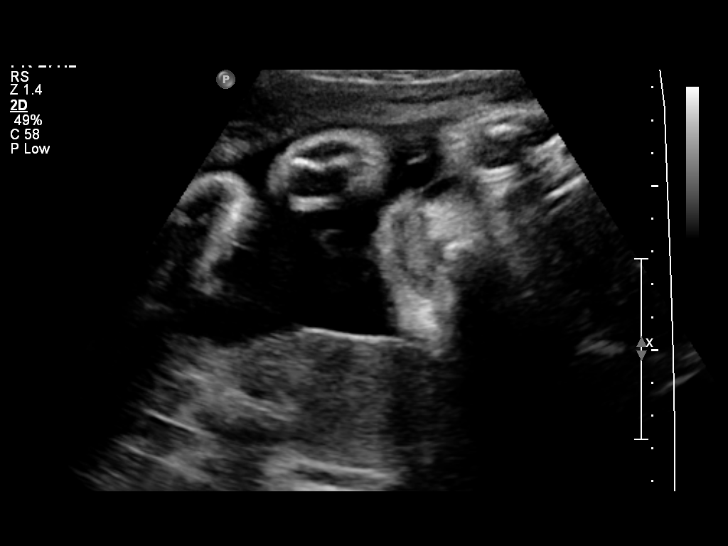

[12 of 28 positions shown; findings below may reference images not displayed]

OBSTETRICS REPORT
                      (Signed Final 11/09/2010 [DATE])

                 [facility]ospital [facility]-
                 Faculty Physician
 Order#:         [id number]
Procedures

 US OB DETAIL + 14 WK                                  76811.0
Indications

 Detailed fetal anatomic survey
 No or Little Prenatal Care
Fetal Evaluation

 Fetal Heart Rate:  134                          bpm
 Cardiac Activity:  Observed
 Presentation:      Frank breech
 Placenta:          Posterior, above cervical
                    os
 P. Cord            Not well visualized
 Insertion:

 Amniotic Fluid
 AFI FV:      Subjectively within normal limits
 AFI Sum:     21.3    cm       80  %Tile     Larg Pckt:    7.95  cm
 RUQ:   3.87    cm   RLQ:    3.35   cm    LUQ:   6.13    cm   LLQ:    7.95   cm
Biometry

 BPD:     80.8  mm     G. Age:  32w 3d                CI:        67.07   70 - 86
                                                      FL/HC:      20.7   19.4 -

 HC:       316  mm     G. Age:  35w 4d       56  %    HC/AC:      1.03   0.96 -

 AC:     308.2  mm     G. Age:  34w 5d       78  %    FL/BPD:     80.8   71 - 87
 FL:      65.3  mm     G. Age:  33w 5d       35  %    FL/AC:      21.2   20 - 24
 HUM:     56.9  mm     G. Age:  33w 0d       41  %
 Est. FW:    0223  gm      5 lb 5 oz     68  %
Gestational Age

 LMP:           33w 6d        Date:  03/17/10                 EDD:   12/22/10
 U/S Today:     34w 1d                                        EDD:   12/20/10
 Best:          33w 6d     Det. By:  LMP  (03/17/10)          EDD:   12/22/10
Anatomy

 Cranium:           Appears normal      Aortic Arch:       Appears normal
 Fetal Cavum:       Appears normal      Ductal Arch:       Appears normal
 Ventricles:        Appears normal      Diaphragm:         Appears normal
 Choroid Plexus:    Appears normal      Stomach:           Appears
                                                           normal, left
                                                           sided
 Cerebellum:        Appears normal      Abdomen:           Appears normal
 Posterior Fossa:   Not well            Abdominal Wall:    Appears nml
                    visualized                             (cord insert,
                                                           abd wall)
 Nuchal Fold:       Not applicable      Cord Vessels:      Appears normal
                    (>20 wks GA)                           (3 vessel cord)
 Face:              Appears normal      Kidneys:           Appear normal
                    (lips/profile/orbit
                    s)
 Heart:             Appears normal      Bladder:           Appears normal
                    (4 chamber &
                    axis)
 RVOT:              Appears normal      Spine:             Appears normal
 LVOT:              Not well            Limbs:             Appears normal
                    visualized                             (hands, ankles,
                                                           feet)

 Other:     Technicallly difficult due to advanced GA and maternal
            habitus. Heels visualized. Fetus appears to be a male.
Cervix Uterus Adnexa

 Cervical Length:    4.64     cm

 Cervix:       Normal appearance by transabdominal scan.

 Adnexa:     No abnormality visualized.
Impression

 Single living intrauterine pregnancy in breech presentation.
 The estimated gestational age is 33w 6d based on LMP
 (03/17/10). Size and dates correlate well. No fetal anomalies
 are identified.

 questions or concerns.

## 2012-09-06 ENCOUNTER — Emergency Department (HOSPITAL_COMMUNITY): Payer: Medicaid Other

## 2012-09-06 ENCOUNTER — Encounter (HOSPITAL_COMMUNITY): Payer: Self-pay | Admitting: *Deleted

## 2012-09-06 ENCOUNTER — Inpatient Hospital Stay (HOSPITAL_COMMUNITY)
Admission: EM | Admit: 2012-09-06 | Discharge: 2012-09-07 | DRG: 392 | Disposition: A | Discharge status: Home / Self Care | Payer: Medicaid Other | Attending: General Surgery | Admitting: General Surgery

## 2012-09-06 DIAGNOSIS — R112 Nausea with vomiting, unspecified: Secondary | ICD-10-CM | POA: Diagnosis present

## 2012-09-06 DIAGNOSIS — A088 Other specified intestinal infections: Principal | ICD-10-CM | POA: Diagnosis present

## 2012-09-06 DIAGNOSIS — R1110 Vomiting, unspecified: Secondary | ICD-10-CM

## 2012-09-06 DIAGNOSIS — E860 Dehydration: Secondary | ICD-10-CM | POA: Diagnosis present

## 2012-09-06 DIAGNOSIS — R1032 Left lower quadrant pain: Secondary | ICD-10-CM | POA: Diagnosis present

## 2012-09-06 DIAGNOSIS — F411 Generalized anxiety disorder: Secondary | ICD-10-CM | POA: Diagnosis present

## 2012-09-06 DIAGNOSIS — A084 Viral intestinal infection, unspecified: Secondary | ICD-10-CM | POA: Diagnosis present

## 2012-09-06 DIAGNOSIS — R109 Unspecified abdominal pain: Secondary | ICD-10-CM

## 2012-09-06 DIAGNOSIS — F3289 Other specified depressive episodes: Secondary | ICD-10-CM | POA: Diagnosis present

## 2012-09-06 DIAGNOSIS — F329 Major depressive disorder, single episode, unspecified: Secondary | ICD-10-CM | POA: Diagnosis present

## 2012-09-06 DIAGNOSIS — K561 Intussusception: Secondary | ICD-10-CM

## 2012-09-06 DIAGNOSIS — D638 Anemia in other chronic diseases classified elsewhere: Secondary | ICD-10-CM | POA: Diagnosis present

## 2012-09-06 DIAGNOSIS — F17200 Nicotine dependence, unspecified, uncomplicated: Secondary | ICD-10-CM | POA: Diagnosis present

## 2012-09-06 HISTORY — DX: Depression, unspecified: F32.A

## 2012-09-06 HISTORY — DX: Major depressive disorder, single episode, unspecified: F32.9

## 2012-09-06 HISTORY — DX: Anxiety disorder, unspecified: F41.9

## 2012-09-06 LAB — URINALYSIS, ROUTINE W REFLEX MICROSCOPIC
Hgb urine dipstick: NEGATIVE
Ketones, ur: 15 mg/dL — AB
Nitrite: NEGATIVE
Specific Gravity, Urine: 1.027 (ref 1.005–1.030)
pH: 6 (ref 5.0–8.0)

## 2012-09-06 LAB — CBC
HCT: 24.1 % — ABNORMAL LOW (ref 36.0–46.0)
Hemoglobin: 6.8 g/dL — CL (ref 12.0–15.0)
MCH: 17.5 pg — ABNORMAL LOW (ref 26.0–34.0)
MCHC: 28.2 g/dL — ABNORMAL LOW (ref 30.0–36.0)
MCV: 62 fL — ABNORMAL LOW (ref 78.0–100.0)
RBC: 3.89 MIL/uL (ref 3.87–5.11)

## 2012-09-06 LAB — CBC WITH DIFFERENTIAL/PLATELET
Basophils Relative: 0 % (ref 0–1)
Eosinophils Relative: 0 % (ref 0–5)
HCT: 31.2 % — ABNORMAL LOW (ref 36.0–46.0)
Hemoglobin: 8.7 g/dL — ABNORMAL LOW (ref 12.0–15.0)
Lymphocytes Relative: 4 % — ABNORMAL LOW (ref 12–46)
Lymphs Abs: 0.6 10*3/uL — ABNORMAL LOW (ref 0.7–4.0)
MCV: 62 fL — ABNORMAL LOW (ref 78.0–100.0)
Monocytes Relative: 9 % (ref 3–12)
Neutro Abs: 12.5 10*3/uL — ABNORMAL HIGH (ref 1.7–7.7)
RBC: 5.03 MIL/uL (ref 3.87–5.11)
WBC: 14.4 10*3/uL — ABNORMAL HIGH (ref 4.0–10.5)

## 2012-09-06 LAB — COMPREHENSIVE METABOLIC PANEL
ALT: 10 U/L (ref 0–35)
AST: 17 U/L (ref 0–37)
Alkaline Phosphatase: 93 U/L (ref 39–117)
CO2: 14 mEq/L — ABNORMAL LOW (ref 19–32)
GFR calc Af Amer: >90 mL/min (ref >90)
GFR calc non Af Amer: 79 mL/min — ABNORMAL LOW (ref >90)
Glucose, Bld: 162 mg/dL — ABNORMAL HIGH (ref 70–99)
Potassium: 3.2 mEq/L — ABNORMAL LOW (ref 3.5–5.1)
Sodium: 136 mEq/L (ref 135–145)
Total Protein: 9.2 g/dL — ABNORMAL HIGH (ref 6.0–8.3)

## 2012-09-06 LAB — URINE MICROSCOPIC-ADD ON

## 2012-09-06 LAB — OCCULT BLOOD, POC DEVICE: Fecal Occult Bld: NEGATIVE

## 2012-09-06 LAB — CREATININE, SERUM: Creatinine, Ser: 0.71 mg/dL (ref 0.50–1.10)

## 2012-09-06 MED ORDER — MIRTAZAPINE 15 MG PO TABS
15.0000 mg | ORAL_TABLET | Freq: Every day | ORAL | Status: DC
  Administered 2012-09-06: 15 mg via ORAL
  Filled 2012-09-06 (×2): qty 1

## 2012-09-06 MED ORDER — POTASSIUM CHLORIDE 10 MEQ/100ML IV SOLN
10.0000 meq | Freq: Once | INTRAVENOUS | Status: AC
  Administered 2012-09-06: 10 meq via INTRAVENOUS
  Filled 2012-09-06: qty 100

## 2012-09-06 MED ORDER — METRONIDAZOLE IN NACL 5-0.79 MG/ML-% IV SOLN
500.0000 mg | Freq: Once | INTRAVENOUS | Status: AC
  Administered 2012-09-06: 500 mg via INTRAVENOUS
  Filled 2012-09-06: qty 100

## 2012-09-06 MED ORDER — ENOXAPARIN SODIUM 30 MG/0.3ML ~~LOC~~ SOLN
30.0000 mg | Freq: Two times a day (BID) | SUBCUTANEOUS | Status: DC
  Administered 2012-09-07: 30 mg via SUBCUTANEOUS
  Filled 2012-09-06 (×4): qty 0.3

## 2012-09-06 MED ORDER — PROMETHAZINE HCL 25 MG/ML IJ SOLN
25.0000 mg | Freq: Once | INTRAMUSCULAR | Status: AC
  Administered 2012-09-06: 25 mg via INTRAVENOUS
  Filled 2012-09-06: qty 1

## 2012-09-06 MED ORDER — SODIUM CHLORIDE 0.9 % IV BOLUS (SEPSIS)
1000.0000 mL | Freq: Once | INTRAVENOUS | Status: AC
  Administered 2012-09-06: 1000 mL via INTRAVENOUS

## 2012-09-06 MED ORDER — POTASSIUM CHLORIDE IN NACL 40-0.9 MEQ/L-% IV SOLN
INTRAVENOUS | Status: DC
  Administered 2012-09-06: 125 mL/h via INTRAVENOUS
  Administered 2012-09-07: 08:00:00 via INTRAVENOUS
  Filled 2012-09-06 (×4): qty 1000

## 2012-09-06 MED ORDER — DIPHENOXYLATE-ATROPINE 2.5-0.025 MG PO TABS
2.0000 | ORAL_TABLET | ORAL | Status: AC
  Administered 2012-09-06: 2 via ORAL
  Filled 2012-09-06: qty 2

## 2012-09-06 MED ORDER — ONDANSETRON HCL 4 MG/2ML IJ SOLN: 4.0000 mg | Freq: Four times a day (QID) | INTRAMUSCULAR | Status: DC | PRN

## 2012-09-06 MED ORDER — FERROUS GLUCONATE 324 (38 FE) MG PO TABS
325.0000 mg | ORAL_TABLET | Freq: Every day | ORAL | Status: DC
  Administered 2012-09-07: 325 mg via ORAL
  Filled 2012-09-06 (×2): qty 1

## 2012-09-06 MED ORDER — FLUOXETINE HCL 20 MG PO TABS
20.0000 mg | ORAL_TABLET | Freq: Every day | ORAL | Status: DC
  Administered 2012-09-06: 20 mg via ORAL
  Filled 2012-09-06 (×2): qty 1

## 2012-09-06 MED ORDER — DIPHENHYDRAMINE HCL 12.5 MG/5ML PO ELIX: 12.5000 mg | ORAL_SOLUTION | Freq: Four times a day (QID) | ORAL | Status: DC | PRN

## 2012-09-06 MED ORDER — CIPROFLOXACIN IN D5W 400 MG/200ML IV SOLN
400.0000 mg | Freq: Once | INTRAVENOUS | Status: AC
  Administered 2012-09-06: 400 mg via INTRAVENOUS
  Filled 2012-09-06: qty 200

## 2012-09-06 MED ORDER — IOHEXOL 300 MG/ML  SOLN
25.0000 mL | INTRAMUSCULAR | Status: AC
  Administered 2012-09-06 (×2): 25 mL via ORAL

## 2012-09-06 MED ORDER — IOHEXOL 300 MG/ML  SOLN
100.0000 mL | Freq: Once | INTRAMUSCULAR | Status: AC | PRN
  Administered 2012-09-06: 100 mL via INTRAVENOUS

## 2012-09-06 MED ORDER — ONDANSETRON HCL 4 MG/2ML IJ SOLN: 4.0000 mg | Freq: Once | INTRAMUSCULAR | Status: DC

## 2012-09-06 MED ORDER — DIPHENHYDRAMINE HCL 50 MG/ML IJ SOLN: 12.5000 mg | Freq: Four times a day (QID) | INTRAMUSCULAR | Status: DC | PRN

## 2012-09-06 NOTE — ED Notes (Signed)
Spoke with Licensed conveyancer to page admit Doctor with critical results.

## 2012-09-06 NOTE — ED Notes (Signed)
This nurse spoke with an MD from [facility] regarding this pt's hgb level of 6.8. [facility] MD told me to take this patient to her admitting floor and will put in order for type and cross 2 units of blood. MD refused to give this nurse his name and stated this was the first time he had received a page.

## 2012-09-06 NOTE — ED Notes (Signed)
Spoke with Licensed conveyancer to have admit Doctor re-paged.

## 2012-09-06 NOTE — ED Notes (Signed)
Patient transported to X-ray 

## 2012-09-06 NOTE — ED Notes (Signed)
Spoke with AD of the ED. Explain critical result of HGB paged admit Doctor multiple times paged will page one more time.

## 2012-09-06 NOTE — ED Notes (Signed)
Pt arrived by ems for abd pain/cramping, n/v/d x 2 weeks. Having blood emesis. Possible syncopal episode this am.

## 2012-09-06 NOTE — Progress Notes (Addendum)
Just received page from the ED about Hgb level of 6.9.  Will type and cross match patient and transfuse 2 units of PRBCs.  Vital signs are normal per nurses report.  Never refused to give the nurse my name--just stated that she did not need it because I was putting in he order for the blood transfusion.  Marta Lamas Gae Bon, MD, FACS [phone] [facility]

## 2012-09-06 NOTE — ED Provider Notes (Signed)
History     CSN: [id number]  Arrival date & time 09/06/12  4132   First MD Initiated Contact with Patient 09/06/12 [phone]      Chief Complaint  Patient presents with  . Emesis  . Diarrhea  . Loss of Consciousness    (Consider location/radiation/quality/duration/timing/severity/associated sxs/prior treatment) HPI Comments: 37 y.o. Female with PMHx of gastric bypass (2004) presents today with worsening nausea, diarrhea, and then vomiting. Pt states she was traveling two weeks ago in [place], states stomach had started bothering her when returning home when she started persistent diarrhea. Vomiting about 5 days ago. She has been unable to keep anything down. Pt states she did notice some bright red streaks in her vomit today. This morning pt was told by her son that she passed out and he called 911. Pt states she did not hit her head as her son was right there and caught her. Pt does not recollect the incident.   Pain has been increasing over the las couple of days. LLQ. Pt tried pepto bismol or oral hydration with no relief. Pt describes pain as throbbing, coming and going. Worse when she has to throw up or have diarrhea. Pain rates the pain 7/10.  Pt states that she does have an appt with her primary care ([facility]) for these sx on Friday.   Pt states no sequelae from gastric bypass (2004)  Patient is a 37 y.o. female presenting with vomiting, diarrhea, and syncope. The history is provided by the patient.  Emesis Severity:  Severe Duration:  2 weeks Timing:  Intermittent Emesis appearance: today pt notes bright red streaks in vomit. Feeding tolerance: not able to keep much down over the past two weeks. Progression:  Worsening Chronicity:  New Recent urination:  Normal Relieved by:  Nothing Exacerbated by: eating. Associated symptoms: abdominal pain and diarrhea   Associated symptoms: no chills and no headaches   Diarrhea Associated symptoms: abdominal pain and vomiting    Associated symptoms: no chills, no diaphoresis, no fever and no headaches   Loss of Consciousness Associated symptoms: vomiting   Associated symptoms: no chest pain, no diaphoresis, no dizziness, no fever, no headaches, no nausea, no palpitations, no shortness of breath and no weakness     Past Medical History  Diagnosis Date  . Rubella     5 months old  . Varicella     48 1/37 years old  . Anemia     blood transfusion 09/14/10  . Anxiety     Past Surgical History  Procedure Laterality Date  . Gastric bypass    . No past surgeries    . Laparoscopic tubal ligation  2007    Family History  Problem Relation Age of Onset  . Cancer Father     History  Substance Use Topics  . Smoking status: Never Smoker   . Smokeless tobacco: Never Used  . Alcohol Use: No    OB History   Grav Para Term Preterm Abortions TAB SAB Ect Mult Living   6 6 6  0 0 0 0 0 0 6      Review of Systems  Constitutional: Positive for appetite change. Negative for fever, chills and diaphoresis.  HENT: Negative for neck pain and neck stiffness.   Eyes: Negative for visual disturbance.  Respiratory: Negative for apnea, chest tightness and shortness of breath.   Cardiovascular: Positive for syncope. Negative for chest pain and palpitations.  Gastrointestinal: Positive for vomiting, abdominal pain and diarrhea. Negative for nausea  and constipation.       Pt notes bright red blood streaks in vomit today, LLQ abdominal pain, throbbing, coming and going  Genitourinary: Negative for dysuria.  Musculoskeletal: Negative for gait problem.  Skin: Negative for rash.  Neurological: Negative for dizziness, weakness, light-headedness, numbness and headaches.    Allergies  Review of patient's allergies indicates no known allergies.  Home Medications   Current Outpatient Rx  Name  Route  Sig  Dispense  Refill  . EXPIRED: carbamazepine (TEGRETOL) 200 MG tablet   Oral   Take 1 tablet (200 mg total) by mouth 2  (two) times daily.   90 tablet   0     200 mg BID fro 7 days than increase to 400 mg BID  .Marland Kitchen.   . ferrous gluconate (FERGON) 325 MG tablet   Oral   Take 325 mg by mouth daily with breakfast.           . Multiple Vitamin (MULITIVITAMIN WITH MINERALS) TABS   Oral   Take 1 tablet by mouth daily.         . naproxen sodium (ANAPROX) 220 MG tablet   Oral   Take 440 mg by mouth 2 (two) times daily with a meal.           BP 121/74  Pulse 88  Temp(Src) 98.3 F (36.8 C) (Oral)  Resp 16  SpO2 99%  Physical Exam  Nursing note and vitals reviewed. Constitutional: She is oriented to person, place, and time. No distress.  sick looking  HENT:  Head: Normocephalic and atraumatic.  Eyes: Conjunctivae and EOM are normal.  Neck: Normal range of motion. Neck supple.  No meningeal signs  Cardiovascular: Normal rate, regular rhythm, normal heart sounds and intact distal pulses.  Exam reveals no gallop and no friction rub.   No murmur heard. Pulmonary/Chest: Effort normal and breath sounds normal. No respiratory distress. She has no wheezes. She has no rales. She exhibits no tenderness.  Abdominal: Soft. Bowel sounds are normal. She exhibits no distension. There is tenderness. There is no rebound and no guarding.  LLQ, no pain at McBurney's point, no rovsing's sign  Musculoskeletal: Normal range of motion. She exhibits no edema and no tenderness.  FROM to upper and lower extremities 5/5 strength  Neurological: She is alert and oriented to person, place, and time. No cranial nerve deficit.  Speech is clear and goal oriented, follows commands Sensation normal to light touch Moves extremities without ataxia, coordination intact Normal strength in upper and lower extremities bilaterally including dorsiflexion and plantar flexion, strong and equal grip strength   Skin: Skin is warm and dry. She is not diaphoretic. No pallor.  jaundince  Psychiatric: She has a normal mood and affect.     ED Course  Procedures (including critical care time)  Date: 09/06/2012  Rate: 88  Rhythm: normal sinus rhythm  QRS Axis: normal  Intervals: normal  ST/T Wave abnormalities:borderline T wave abnormalities  Conduction Disutrbances:none  Narrative Interpretation: borderline EKG  Old EKG Reviewed: none available   Labs Reviewed  CBC WITH DIFFERENTIAL - Abnormal; Notable for the following:    WBC 14.4 (*)    Hemoglobin 8.7 (*)    HCT 31.2 (*)    MCV 62.0 (*)    MCH 17.3 (*)    MCHC 27.9 (*)    RDW 18.4 (*)    All other components within normal limits  COMPREHENSIVE METABOLIC PANEL - Abnormal; Notable for the following:  Potassium 3.2 (*)    CO2 14 (*)    Glucose, Bld 162 (*)    Total Protein 9.2 (*)    GFR calc non Af Amer 79 (*)    All other components within normal limits  LIPASE, BLOOD  TROPONIN I  URINALYSIS, ROUTINE W REFLEX MICROSCOPIC   Dg Chest 2 View  09/06/2012   *RADIOLOGY REPORT*  Clinical Data: Syncope, weakness, dizziness, smoker  CHEST - 2 VIEW  Comparison: None  Findings: Normal heart size, mediastinal contours, and pulmonary vascularity. Lungs mildly hyperinflated but clear. No pleural effusion or pneumothorax. Bones unremarkable. Nipple shadow right lung base.  IMPRESSION: Mildly hyperinflated lungs without acute infiltrate.   Original Report Authenticated By: Ulyses Southward, M.D.   Ct Head Wo Contrast  09/06/2012   *RADIOLOGY REPORT*  Clinical Data: Nausea, vomiting for 2 weeks.  Loss of consciousness.  Possible syncope.  Headache.  CT HEAD WITHOUT CONTRAST  Technique:  Contiguous axial images were obtained from the base of the skull through the vertex without contrast.  Comparison: 08/18/2011 brain MRI (report not available).  Head CT 08/02/2011.  Findings: Bone windows demonstrate clear paranasal sinuses and mastoid air cells.  Soft tissue windows demonstrate no  mass lesion, hemorrhage, hydrocephalus, acute infarct, intra-axial, or extra-axial fluid  collection.  IMPRESSION: Normal head CT.   Original Report Authenticated By: Jeronimo Greaves, M.D.   Ct Abdomen Pelvis W Contrast  09/06/2012   *RADIOLOGY REPORT*  Clinical Data: Emesis and diarrhea.  Left lower quadrant pain with cramping.  Nausea vomiting diarrhea for 2 weeks.  CT ABDOMEN AND PELVIS WITH CONTRAST  Technique:  Multidetector CT imaging of the abdomen and pelvis was performed following the standard protocol during bolus administration of intravenous contrast.  Contrast: OMNIPAQUE IOHEXOL 300 MG/ML  SOLN  Comparison: None.  Findings: Lung bases:  Clear lung bases.  Normal heart size without pericardial or pleural effusion.  Abdomen/pelvis:  Normal liver, spleen.  Minimal calcification along the posterior splenic capsule.  Status post Roux-en-Y gastric bypass.  No contrast within the bypassed stomach to suggest gastric/gastric fistula.  Antecolic roux loop is normal in caliber, with the jejunal jejunal anastomoses identified on image 32/series 2.  Normal pancreas. Cholecystectomy without biliary ductal dilatation. A nodule within the right suprarenal space measures 2.7 cm and 23 HU.  This is favored to arise from the right adrenal gland.  Normal left adrenal gland and left kidney.  No retroperitoneal or retrocrural adenopathy.  The colon is partially fluid-filled, normal in caliber. Normal terminal ileum.  Appendix is not visualized but there is no evidence of right lower quadrant inflammation.  Small bowel loops are normal in caliber.  In the region of the jejunal jejunal anastomoses, there is a small bowel intussusception on image 30/series 2, nonobstructive. This extends for a 5.9 cm distance on coronal image 41.  Just inferior to this is a second intussusception which is favored to be retrograde.  Example image 45/series 2. Coronal image 41.  No edema at either site or mesenteric fluid.  No pelvic adenopathy.  Normal urinary bladder and uterus.  No adnexal mass or significant free pelvic  fluid.  Bones/Musculoskeletal:  No acute osseous abnormality.  Degenerative disc disease at the lumbosacral junction is age advanced.  IMPRESSION:  1.  Status post Roux-en-Y gastric bypass. 2.  2 jejunal/jejunal intussusception2. Given the length of the more cephalad intussusception, underlying lead point cannot be excluded.  Consider further characterization with follow-up CT enterography or MR enterography at 2 -  3 days. 3.  Right suprarenal nodule which is favored to arise from the right adrenal gland.  This is indeterminate on this postcontrast study.  Most likely an adenoma.  This could be further evaluated on follow-up with pre and post contrast dedicated series or further characterized with dedicated adrenal protocol MRI.   Original Report Authenticated By: Jeronimo Greaves, M.D.     No diagnosis found.    MDM  37 y.o. Female with PMHx of gastric bypass (2004) presents today with worsening nausea, diarrhea, and then vomiting. No sequelae from gastric bypass. Pt is not well-appearing and very uncomfortable looking. Asked Dr. Silverio Lay to look at her as well. Labs ordered and CT of abd/pelv.  Lab lactic acid returned normal while i-stat lactic acid returned elevated.   CT returned jejunal/jejunal intussusception. At this point, Dr. Silverio Lay took over care of pt to consult with surgery.   Glade Nurse, PA-C 09/06/12 1554

## 2012-09-06 NOTE — ED Notes (Signed)
Spoke with Licensed conveyancer to page admit Doctor 3rd time.

## 2012-09-06 NOTE — H&P (Signed)
Carrie Douglas is an 37 y.o. female.   Chief Complaint: LLQ abdominal pain, N/V/D HPI:  37 y.o. Female with PMHx of gastric bypass (Roux en Y 2004) presents today with worsening nausea, diarrhea, and then vomiting. Pt states she was traveling two weeks ago in [place], states stomach had started bothering after stopping at a fast food restaurant for a hamburger on the drive home.  She started to have persistent watery diarrhea for 2 weeks and nausea.  Post-prandial vomiting for at least a week.  She has been unable to keep anything down.  Pt states she did notice some bright red streaks in her vomit today.  Pain has been increasing over the las couple of days.  LLQ.  Pt tried pepto bismol, imodium, or oral hydration with no relief.  Pt describes pain as throbbing pain 10/10 doubled over pain.  Worse when she has to throw up or have diarrhea. Pain rates the pain now 8/10.  She has been feeling weak and having muscle/joint aches.  No fever/chills, and no URI symptoms or chest pain/SOB.  This morning pt was told by her son that she passed out and he called 911.  Pt states she did not hit her head as her son was right there and caught her. Pt does not recollect the incident.  She has had 6 vaginal deliveries, gastric Roux en Y, and laparoscopic tubal ligation.   Past Medical History  Diagnosis Date  . Rubella     5 months old  . Varicella     13 1/37 years old  . Anemia     blood transfusion 09/14/10  . Anxiety   . Chronic right-sided headaches   . Depression     Past Surgical History  Procedure Laterality Date  . Gastric bypass  2004  . Laparoscopic tubal ligation  2007    Family History  Problem Relation Age of Onset  . Cancer Father    Social History:  reports that she has been smoking Cigarettes.  She has been smoking about 0.20 packs per day. She has never used smokeless tobacco. She reports that she does not drink alcohol or use illicit drugs.  Allergies: No Known Allergies   (Not  in a hospital admission)  Results for orders placed during the hospital encounter of 09/06/12 (from the past 48 hour(s))  CBC WITH DIFFERENTIAL     Status: Abnormal   Collection Time    09/06/12  8:55 AM      Result Value Range   WBC 14.4 (*) 4.0 - 10.5 K/uL   RBC 5.03  3.87 - 5.11 MIL/uL   Hemoglobin 8.7 (*) 12.0 - 15.0 g/dL   HCT 16.1 (*) 09.6 - 04.5 %   MCV 62.0 (*) 78.0 - 100.0 fL   MCH 17.3 (*) 26.0 - 34.0 pg   MCHC 27.9 (*) 30.0 - 36.0 g/dL   RDW 40.9 (*) 81.1 - 91.4 %   Platelets 383  150 - 400 K/uL   Neutrophils Relative % 87 (*) 43 - 77 %   Lymphocytes Relative 4 (*) 12 - 46 %   Monocytes Relative 9  3 - 12 %   Eosinophils Relative 0  0 - 5 %   Basophils Relative 0  0 - 1 %   Neutro Abs 12.5 (*) 1.7 - 7.7 K/uL   Lymphs Abs 0.6 (*) 0.7 - 4.0 K/uL   Monocytes Absolute 1.3 (*) 0.1 - 1.0 K/uL   Eosinophils Absolute 0.0  0.0 -  0.7 K/uL   Basophils Absolute 0.0  0.0 - 0.1 K/uL   RBC Morphology POLYCHROMASIA PRESENT     Comment: ELLIPTOCYTES     TEARDROP CELLS   WBC Morphology INCREASED BANDS (>20% BANDS)     Smear Review LARGE PLATELETS PRESENT    COMPREHENSIVE METABOLIC PANEL     Status: Abnormal   Collection Time    09/06/12  8:55 AM      Result Value Range   Sodium 136  135 - 145 mEq/L   Potassium 3.2 (*) 3.5 - 5.1 mEq/L   Chloride 100  96 - 112 mEq/L   CO2 14 (*) 19 - 32 mEq/L   Glucose, Bld 162 (*) 70 - 99 mg/dL   BUN 14  6 - 23 mg/dL   Creatinine, Ser 1.61  0.50 - 1.10 mg/dL   Calcium 09.6  8.4 - 04.5 mg/dL   Total Protein 9.2 (*) 6.0 - 8.3 g/dL   Albumin 5.0  3.5 - 5.2 g/dL   AST 17  0 - 37 U/L   ALT 10  0 - 35 U/L   Alkaline Phosphatase 93  39 - 117 U/L   Total Bilirubin 0.4  0.3 - 1.2 mg/dL   GFR calc non Af Amer 79 (*) >90 mL/min   GFR calc Af Amer >90  >90 mL/min   Comment:            The eGFR has been calculated     using the CKD EPI equation.     This calculation has not been     validated in all clinical     situations.     eGFR's persistently      <90 mL/min signify     possible Chronic Kidney Disease.  LIPASE, BLOOD     Status: None   Collection Time    09/06/12  8:55 AM      Result Value Range   Lipase 16  11 - 59 U/L  TROPONIN I     Status: None   Collection Time    09/06/12  8:57 AM      Result Value Range   Troponin I <0.30  <0.30 ng/mL   Comment:            Due to the release kinetics of cTnI,     a negative result within the first hours     of the onset of symptoms does not rule out     myocardial infarction with certainty.     If myocardial infarction is still suspected,     repeat the test at appropriate intervals.  URINALYSIS, ROUTINE W REFLEX MICROSCOPIC     Status: Abnormal   Collection Time    09/06/12 10:47 AM      Result Value Range   Color, Urine AMBER (*) YELLOW   Comment: BIOCHEMICALS MAY BE AFFECTED BY COLOR   APPearance CLOUDY (*) CLEAR   Specific Gravity, Urine 1.027  1.005 - 1.030   pH 6.0  5.0 - 8.0   Glucose, UA 100 (*) NEGATIVE mg/dL   Hgb urine dipstick NEGATIVE  NEGATIVE   Bilirubin Urine MODERATE (*) NEGATIVE   Ketones, ur 15 (*) NEGATIVE mg/dL   Protein, ur 409 (*) NEGATIVE mg/dL   Urobilinogen, UA 0.2  0.0 - 1.0 mg/dL   Nitrite NEGATIVE  NEGATIVE   Leukocytes, UA SMALL (*) NEGATIVE  URINE MICROSCOPIC-ADD ON     Status: Abnormal   Collection Time    09/06/12 10:47 AM  Result Value Range   Squamous Epithelial / LPF MANY (*) RARE   WBC, UA 3-6  <3 WBC/hpf   RBC / HPF 0-2  <3 RBC/hpf   Bacteria, UA MANY (*) RARE   Casts HYALINE CASTS (*) NEGATIVE   Comment: GRANULAR CAST   Urine-Other AMORPHOUS URATES/PHOSPHATES    POCT PREGNANCY, URINE     Status: None   Collection Time    09/06/12 10:57 AM      Result Value Range   Preg Test, Ur NEGATIVE  NEGATIVE   Comment:            THE SENSITIVITY OF THIS     METHODOLOGY IS >24 mIU/mL  LACTIC ACID, PLASMA     Status: None   Collection Time    09/06/12 11:00 AM      Result Value Range   Lactic Acid, Venous 1.3  0.5 - 2.2 mmol/L    CG4 I-STAT (LACTIC ACID)     Status: Abnormal   Collection Time    09/06/12 11:16 AM      Result Value Range   Lactic Acid, Venous 3.16 (*) 0.5 - 2.2 mmol/L  OCCULT BLOOD, POC DEVICE     Status: None   Collection Time    09/06/12  1:57 PM      Result Value Range   Fecal Occult Bld NEGATIVE  NEGATIVE   Dg Chest 2 View  09/06/2012   *RADIOLOGY REPORT*  Clinical Data: Syncope, weakness, dizziness, smoker  CHEST - 2 VIEW  Comparison: None  Findings: Normal heart size, mediastinal contours, and pulmonary vascularity. Lungs mildly hyperinflated but clear. No pleural effusion or pneumothorax. Bones unremarkable. Nipple shadow right lung base.  IMPRESSION: Mildly hyperinflated lungs without acute infiltrate.   Original Report Authenticated By: Ulyses Southward, M.D.   Ct Head Wo Contrast  09/06/2012   *RADIOLOGY REPORT*  Clinical Data: Nausea, vomiting for 2 weeks.  Loss of consciousness.  Possible syncope.  Headache.  CT HEAD WITHOUT CONTRAST  Technique:  Contiguous axial images were obtained from the base of the skull through the vertex without contrast.  Comparison: 08/18/2011 brain MRI (report not available).  Head CT 08/02/2011.  Findings: Bone windows demonstrate clear paranasal sinuses and mastoid air cells.  Soft tissue windows demonstrate no  mass lesion, hemorrhage, hydrocephalus, acute infarct, intra-axial, or extra-axial fluid collection.  IMPRESSION: Normal head CT.   Original Report Authenticated By: Jeronimo Greaves, M.D.   Ct Abdomen Pelvis W Contrast  09/06/2012   *RADIOLOGY REPORT*  Clinical Data: Emesis and diarrhea.  Left lower quadrant pain with cramping.  Nausea vomiting diarrhea for 2 weeks.  CT ABDOMEN AND PELVIS WITH CONTRAST  Technique:  Multidetector CT imaging of the abdomen and pelvis was performed following the standard protocol during bolus administration of intravenous contrast.  Contrast: OMNIPAQUE IOHEXOL 300 MG/ML  SOLN  Comparison: None.  Findings: Lung bases:  Clear lung  bases.  Normal heart size without pericardial or pleural effusion.  Abdomen/pelvis:  Normal liver, spleen.  Minimal calcification along the posterior splenic capsule.  Status post Roux-en-Y gastric bypass.  No contrast within the bypassed stomach to suggest gastric/gastric fistula.  Antecolic roux loop is normal in caliber, with the jejunal jejunal anastomoses identified on image 32/series 2.  Normal pancreas. Cholecystectomy without biliary ductal dilatation. A nodule within the right suprarenal space measures 2.7 cm and 23 HU.  This is favored to arise from the right adrenal gland.  Normal left adrenal gland and left kidney.  No  retroperitoneal or retrocrural adenopathy.  The colon is partially fluid-filled, normal in caliber. Normal terminal ileum.  Appendix is not visualized but there is no evidence of right lower quadrant inflammation.  Small bowel loops are normal in caliber.  In the region of the jejunal jejunal anastomoses, there is a small bowel intussusception on image 30/series 2, nonobstructive. This extends for a 5.9 cm distance on coronal image 41.  Just inferior to this is a second intussusception which is favored to be retrograde.  Example image 45/series 2. Coronal image 41.  No edema at either site or mesenteric fluid.  No pelvic adenopathy.  Normal urinary bladder and uterus.  No adnexal mass or significant free pelvic fluid.  Bones/Musculoskeletal:  No acute osseous abnormality.  Degenerative disc disease at the lumbosacral junction is age advanced.  IMPRESSION:  1.  Status post Roux-en-Y gastric bypass. 2.  2 jejunal/jejunal intussusception2. Given the length of the more cephalad intussusception, underlying lead point cannot be excluded.  Consider further characterization with follow-up CT enterography or MR enterography at 2 - 3 days. 3.  Right suprarenal nodule which is favored to arise from the right adrenal gland.  This is indeterminate on this postcontrast study.  Most likely an adenoma.   This could be further evaluated on follow-up with pre and post contrast dedicated series or further characterized with dedicated adrenal protocol MRI.   Original Report Authenticated By: Jeronimo Greaves, M.D.    Review of Systems  Constitutional: Positive for malaise/fatigue. Negative for fever, chills and weight loss.  HENT: Negative for congestion and sore throat.   Respiratory: Negative for cough, shortness of breath and wheezing.   Cardiovascular: Negative for chest pain, palpitations and leg swelling.  Gastrointestinal: Positive for heartburn, nausea, vomiting, abdominal pain (LLQ and suprapubic) and diarrhea (watery diarrhea). Negative for constipation and blood in stool.  Genitourinary: Negative for dysuria and frequency.  Musculoskeletal: Positive for myalgias and joint pain.  Skin: Negative for rash.  Neurological: Positive for weakness. Negative for dizziness and headaches.    Blood pressure 104/56, pulse 68, temperature 98.3 F (36.8 C), temperature source Oral, resp. rate 16, last menstrual period 08/25/2012, SpO2 98.00%, unknown if currently breastfeeding. Physical Exam  Constitutional: She appears well-developed and well-nourished. No distress.  HENT:  Head: Normocephalic and atraumatic.  Mouth/Throat: No oropharyngeal exudate.  Cardiovascular: Normal rate and regular rhythm.  Exam reveals no gallop and no friction rub.   No murmur heard. Respiratory: Effort normal and breath sounds normal. No respiratory distress. She has no wheezes. She exhibits no tenderness.  GI: Soft. Bowel sounds are normal. She exhibits no distension and no mass. There is no hepatosplenomegaly. There is tenderness (LLQ and suprapubic). There is no rigidity, no rebound and no guarding. No hernia. Hernia confirmed negative in the ventral area, confirmed negative in the right inguinal area and confirmed negative in the left inguinal area.    Musculoskeletal: Normal range of motion. She exhibits no edema and  no tenderness.  Neurological: She is alert.  Skin: Skin is warm and dry. No rash noted. No erythema. No pallor.  Psychiatric: She has a normal mood and affect. Her behavior is normal. Judgment and thought content normal.     Assessment/Plan CT findings of intersusception Gastroenteritis, likely viral 1.  Admit for observation and pain control 2.  Pain control, IVF, antiemetics, bowel rest 3.  SCD's and Lovenox 4.  Ambulate and IS 5.  Repeat labs and KUB tomorrow    Aris Georgia 09/06/2012, 3:47 PM

## 2012-09-07 ENCOUNTER — Observation Stay (HOSPITAL_COMMUNITY): Payer: Medicaid Other

## 2012-09-07 LAB — CBC
HCT: 27.9 % — ABNORMAL LOW (ref 36.0–46.0)
Hemoglobin: 8.2 g/dL — ABNORMAL LOW (ref 12.0–15.0)
MCHC: 29.4 g/dL — ABNORMAL LOW (ref 30.0–36.0)
MCV: 66.9 fL — ABNORMAL LOW (ref 78.0–100.0)
WBC: 7.2 10*3/uL (ref 4.0–10.5)

## 2012-09-07 LAB — BASIC METABOLIC PANEL
BUN: 8 mg/dL (ref 6–23)
CO2: 18 mEq/L — ABNORMAL LOW (ref 19–32)
Chloride: 109 mEq/L (ref 96–112)
GFR calc Af Amer: >90 mL/min (ref >90)
Potassium: 3.2 mEq/L — ABNORMAL LOW (ref 3.5–5.1)

## 2012-09-07 LAB — CLOSTRIDIUM DIFFICILE BY PCR: Toxigenic C. Difficile by PCR: NEGATIVE

## 2012-09-07 MED ORDER — ONDANSETRON HCL 4 MG PO TABS: 4.0000 mg | ORAL_TABLET | Freq: Two times a day (BID) | ORAL | Status: DC | PRN

## 2012-09-07 MED ORDER — PSYLLIUM 95 % PO PACK
1.0000 | PACK | Freq: Two times a day (BID) | ORAL | Status: DC
  Administered 2012-09-07: 1 via ORAL
  Filled 2012-09-07 (×2): qty 1

## 2012-09-07 NOTE — H&P (Signed)
I have seen and examined the pt and agree with PA-Dort's progress note.  Serial abd exams KUB IVF hydration

## 2012-09-07 NOTE — Progress Notes (Signed)
Discharge patient. Home discharge instruction given, no question verbalized. 

## 2012-09-07 NOTE — Discharge Summary (Signed)
Physician Discharge Summary  Patient ID: Carrie Douglas MRN: [id number] DOB/AGE: [birth year] 36 y.o.  Admit date: 09/06/2012 Discharge date: 09/07/2012  Admitting Diagnosis: LLQ abdominal pain Viral enteritis N/V/D Intussusception  Discharge Diagnosis Patient Active Problem List   Diagnosis Date Noted  . Abdominal pain, left lower quadrant 09/06/2012  . Anemia of chronic disease 09/06/2012  . Intussusception of jejunum 09/06/2012  . Viral enteritis 09/06/2012  . Nausea vomiting and diarrhea 09/06/2012  . AMA (advanced maternal age) multigravida 35+ 12/15/2010  . Frequent UTI 12/15/2010  . Anemia, antepartum 12/15/2010    Consultants None  Imaging: Dg Chest 2 View  09/06/2012   *RADIOLOGY REPORT*  Clinical Data: Syncope, weakness, dizziness, smoker  CHEST - 2 VIEW  Comparison: None  Findings: Normal heart size, mediastinal contours, and pulmonary vascularity. Lungs mildly hyperinflated but clear. No pleural effusion or pneumothorax. Bones unremarkable. Nipple shadow right lung base.  IMPRESSION: Mildly hyperinflated lungs without acute infiltrate.   Original Report Authenticated By: Ulyses Southward, M.D.   Ct Head Wo Contrast  09/06/2012   *RADIOLOGY REPORT*  Clinical Data: Nausea, vomiting for 2 weeks.  Loss of consciousness.  Possible syncope.  Headache.  CT HEAD WITHOUT CONTRAST  Technique:  Contiguous axial images were obtained from the base of the skull through the vertex without contrast.  Comparison: 08/18/2011 brain MRI (report not available).  Head CT 08/02/2011.  Findings: Bone windows demonstrate clear paranasal sinuses and mastoid air cells.  Soft tissue windows demonstrate no  mass lesion, hemorrhage, hydrocephalus, acute infarct, intra-axial, or extra-axial fluid collection.  IMPRESSION: Normal head CT.   Original Report Authenticated By: Jeronimo Greaves, M.D.   Ct Abdomen Pelvis W Contrast  09/06/2012   *RADIOLOGY REPORT*  Clinical Data: Emesis and diarrhea.  Left lower quadrant  pain with cramping.  Nausea vomiting diarrhea for 2 weeks.  CT ABDOMEN AND PELVIS WITH CONTRAST  Technique:  Multidetector CT imaging of the abdomen and pelvis was performed following the standard protocol during bolus administration of intravenous contrast.  Contrast: OMNIPAQUE IOHEXOL 300 MG/ML  SOLN  Comparison: None.  Findings: Lung bases:  Clear lung bases.  Normal heart size without pericardial or pleural effusion.  Abdomen/pelvis:  Normal liver, spleen.  Minimal calcification along the posterior splenic capsule.  Status post Roux-en-Y gastric bypass.  No contrast within the bypassed stomach to suggest gastric/gastric fistula.  Antecolic roux loop is normal in caliber, with the jejunal jejunal anastomoses identified on image 32/series 2.  Normal pancreas. Cholecystectomy without biliary ductal dilatation. A nodule within the right suprarenal space measures 2.7 cm and 23 HU.  This is favored to arise from the right adrenal gland.  Normal left adrenal gland and left kidney.  No retroperitoneal or retrocrural adenopathy.  The colon is partially fluid-filled, normal in caliber. Normal terminal ileum.  Appendix is not visualized but there is no evidence of right lower quadrant inflammation.  Small bowel loops are normal in caliber.  In the region of the jejunal jejunal anastomoses, there is a small bowel intussusception on image 30/series 2, nonobstructive. This extends for a 5.9 cm distance on coronal image 41.  Just inferior to this is a second intussusception which is favored to be retrograde.  Example image 45/series 2. Coronal image 41.  No edema at either site or mesenteric fluid.  No pelvic adenopathy.  Normal urinary bladder and uterus.  No adnexal mass or significant free pelvic fluid.  Bones/Musculoskeletal:  No acute osseous abnormality.  Degenerative disc disease at the lumbosacral junction  is age advanced.  IMPRESSION:  1.  Status post Roux-en-Y gastric bypass. 2.  2 jejunal/jejunal  intussusception2. Given the length of the more cephalad intussusception, underlying lead point cannot be excluded.  Consider further characterization with follow-up CT enterography or MR enterography at 2 - 3 days. 3.  Right suprarenal nodule which is favored to arise from the right adrenal gland.  This is indeterminate on this postcontrast study.  Most likely an adenoma.  This could be further evaluated on follow-up with pre and post contrast dedicated series or further characterized with dedicated adrenal protocol MRI.   Original Report Authenticated By: Jeronimo Greaves, M.D.   Dg Abd 2 Views  09/07/2012   *RADIOLOGY REPORT*  Clinical Data: Abdominal pain improve, follow-up intussusception  ABDOMEN - 2 VIEW  Comparison: None Correlation:  CT abdomen and pelvis 09/07/2038  Findings: Retained contrast in colon. No evidence of bowel obstruction or bowel dilatation. Previously identified small bowel intussusception on CT is not definitely visualized. Bones unremarkable. Multiple pelvic phleboliths. No definite free intraperitoneal air. Evidence of prior gastric surgery. Lung bases clear.  IMPRESSION: Nonobstructive bowel gas pattern. Intussusception identified on prior CT is not radiographically evident.   Original Report Authenticated By: Ulyses Southward, M.D.    Procedures None  Hospital Course:  37 y.o. Female with PMHx of gastric bypass (Roux en Y 2004) presents today with worsening nausea, diarrhea, and then vomiting. Pt states she was traveling two weeks ago in [place], states stomach had started bothering after stopping at a fast food restaurant for a hamburger on the drive home. She started to have persistent watery diarrhea for 2 weeks and nausea. Post-prandial vomiting for at least a week. She has been unable to keep anything down. Pt states she did notice some bright red streaks in her vomit today. Pain has been increasing over the las couple of days. LLQ. Pt tried pepto bismol, imodium, or oral  hydration with no relief. Pt describes pain as throbbing pain 10/10 doubled over pain. Worse when she has to throw up or have diarrhea. Pain rates the pain now 8/10. She has been feeling weak and having muscle/joint aches. No fever/chills, and no URI symptoms or chest pain/SOB.  This morning pt was told by her son that she passed out and he called 911. Pt states she did not hit her head as her son was right there and caught her. Pt does not recollect the incident. She has had 6 vaginal deliveries, gastric Roux en Y, and laparoscopic tubal ligation.  Workup showed possible intussusception of the jejunum.  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  This will need further workup with her PCP.  The patient's signs and symptoms were more consistent with viral enteritis.  She also showed evidence of dehydration.  She was given fluid hydration and started on a clear liquid diet.  After fluid resuscitation her Hgb dropped to 6.8.  She was give 2 units of blood, which brought the Hgb up to 8.2.   Diet was advanced as tolerated.  On HD #2, the patient was voiding well, tolerating diet, ambulating well, pain well controlled, vital signs stable, and felt stable for discharge home.  Patient will follow up in our office only as needed and knows to call with questions or concerns.  She has an appt with her PCP tomorrow which she should keep to f/u on her anemia and have a CBC redrawn.  She may also need  a referral to Hematology to better manager her chronic disease anemia likely due to her gastric bypass.  She is advised to stick to a bland diet, start probiotic, and metamucil to help bulk up the stools.    Physical Exam: General:  Alert, NAD, pleasant, comfortable Abd:  Soft, ND, minimal tenderness, no HSM    Medication List    TAKE these medications       ferrous gluconate 325 MG tablet  Commonly known as:  FERGON  Take 325 mg by mouth daily with breakfast.      FLUoxetine 20 MG tablet  Commonly known as:  PROZAC  Take 20 mg by mouth at bedtime.     mirtazapine 15 MG tablet  Commonly known as:  REMERON  Take 15 mg by mouth at bedtime.     multivitamin with minerals Tabs  Take 1 tablet by mouth daily.             Follow-up Information   Call [facility],MD, MD. (As needed)    Contact information:   [address] [place] [address] [phone]       Schedule an appointment as soon as possible for a visit with Josph Macho, MD. (Name of a Hematologist in the area.)    Contact information:   [address] [place] [address] [phone]       Follow up with Default, Provider, MD On 09/08/2012. (KEEP YOUR APPT TOMORROW WITH YOUR PRIMARY CARE PROVIDER, REMEMBER TO BRING UP ABOUT YOUR ANEMIA AND REFERRAL TO HEMATOLOGY)    Contact information:   [address] [place] [address] [phone]       Signed: Aris Georgia [facility] [phone]  09/07/2012, 12:08 PM

## 2012-09-07 NOTE — Discharge Summary (Signed)
I have seen and examined the pt and agree with PA-Dort's progress note.  

## 2012-09-08 LAB — URINE CULTURE: Colony Count: >=100000

## 2012-09-08 LAB — TYPE AND SCREEN: ABO/RH(D): A POS

## 2012-09-08 NOTE — ED Provider Notes (Signed)
Medical screening examination/treatment/procedure(s) were conducted as a shared visit with non-physician practitioner(s) and myself.  I personally evaluated the patient during the encounter  Carrie Douglas is a 37 y.o. female hx of gastric bypass in [place] here with ab pain, nausea, vomiting for 2 weeks, getting worse. Appeared dehydrated in the ED. Mild epigastric tenderness on exam. Labs showed mildly elevated istat lactate but nl lab lactate. CT showed possible intussusception. I called surgery, who thinks its likely gastro but will admit for monitoring. Requested that I give her cipro, flagyl, which were ordered.    Richardean Canal, MD 09/08/12 1501

## 2012-09-10 LAB — STOOL CULTURE: Special Requests: NORMAL

## 2013-03-10 ENCOUNTER — Encounter (HOSPITAL_COMMUNITY): Payer: Self-pay | Admitting: Emergency Medicine

## 2013-03-10 ENCOUNTER — Emergency Department (HOSPITAL_COMMUNITY)
Admission: EM | Admit: 2013-03-10 | Discharge: 2013-03-10 | Disposition: A | Discharge status: Home / Self Care | Payer: Medicaid Other | Attending: Emergency Medicine | Admitting: Emergency Medicine

## 2013-03-10 DIAGNOSIS — N390 Urinary tract infection, site not specified: Secondary | ICD-10-CM | POA: Insufficient documentation

## 2013-03-10 DIAGNOSIS — Z792 Long term (current) use of antibiotics: Secondary | ICD-10-CM | POA: Insufficient documentation

## 2013-03-10 DIAGNOSIS — N739 Female pelvic inflammatory disease, unspecified: Secondary | ICD-10-CM | POA: Insufficient documentation

## 2013-03-10 DIAGNOSIS — R110 Nausea: Secondary | ICD-10-CM | POA: Insufficient documentation

## 2013-03-10 DIAGNOSIS — F17200 Nicotine dependence, unspecified, uncomplicated: Secondary | ICD-10-CM | POA: Insufficient documentation

## 2013-03-10 DIAGNOSIS — Z8659 Personal history of other mental and behavioral disorders: Secondary | ICD-10-CM | POA: Insufficient documentation

## 2013-03-10 DIAGNOSIS — M549 Dorsalgia, unspecified: Secondary | ICD-10-CM | POA: Insufficient documentation

## 2013-03-10 DIAGNOSIS — Z862 Personal history of diseases of the blood and blood-forming organs and certain disorders involving the immune mechanism: Secondary | ICD-10-CM | POA: Insufficient documentation

## 2013-03-10 DIAGNOSIS — N730 Acute parametritis and pelvic cellulitis: Secondary | ICD-10-CM

## 2013-03-10 LAB — URINALYSIS, ROUTINE W REFLEX MICROSCOPIC
Glucose, UA: NEGATIVE mg/dL
Ketones, ur: 15 mg/dL — AB
Specific Gravity, Urine: 1.028 (ref 1.005–1.030)
pH: 5.5 (ref 5.0–8.0)

## 2013-03-10 LAB — WET PREP, GENITAL
Trich, Wet Prep: NONE SEEN
Yeast Wet Prep HPF POC: NONE SEEN

## 2013-03-10 LAB — POCT PREGNANCY, URINE: Preg Test, Ur: NEGATIVE

## 2013-03-10 LAB — URINE MICROSCOPIC-ADD ON

## 2013-03-10 MED ORDER — LIDOCAINE HCL (PF) 1 % IJ SOLN
INTRAMUSCULAR | Status: AC
  Administered 2013-03-10: 2 mL
  Filled 2013-03-10: qty 5

## 2013-03-10 MED ORDER — ONDANSETRON 4 MG PO TBDP
4.0000 mg | ORAL_TABLET | Freq: Once | ORAL | Status: AC
  Administered 2013-03-10: 4 mg via ORAL
  Filled 2013-03-10: qty 1

## 2013-03-10 MED ORDER — CEFTRIAXONE SODIUM 1 G IJ SOLR
1.0000 g | Freq: Once | INTRAMUSCULAR | Status: AC
  Administered 2013-03-10: 1 g via INTRAMUSCULAR

## 2013-03-10 MED ORDER — CEFTRIAXONE SODIUM 250 MG IJ SOLR: 250.0000 mg | Freq: Once | INTRAMUSCULAR | Status: DC

## 2013-03-10 MED ORDER — DOXYCYCLINE HYCLATE 100 MG PO CAPS: 100.0000 mg | ORAL_CAPSULE | Freq: Two times a day (BID) | ORAL | Status: DC

## 2013-03-10 MED ORDER — AMOXICILLIN 500 MG PO CAPS
500.0000 mg | ORAL_CAPSULE | Freq: Once | ORAL | Status: AC
  Administered 2013-03-10: 500 mg via ORAL
  Filled 2013-03-10: qty 1

## 2013-03-10 MED ORDER — KETOROLAC TROMETHAMINE 60 MG/2ML IM SOLN
60.0000 mg | Freq: Once | INTRAMUSCULAR | Status: AC
  Administered 2013-03-10: 60 mg via INTRAMUSCULAR
  Filled 2013-03-10: qty 2

## 2013-03-10 MED ORDER — AZITHROMYCIN 1 G PO PACK
1.0000 g | PACK | Freq: Once | ORAL | Status: AC
  Administered 2013-03-10: 1 g via ORAL
  Filled 2013-03-10: qty 1

## 2013-03-10 MED ORDER — CEFTRIAXONE SODIUM 250 MG IJ SOLR
250.0000 mg | Freq: Once | INTRAMUSCULAR | Status: DC
  Filled 2013-03-10: qty 250

## 2013-03-10 NOTE — ED Provider Notes (Signed)
CSN: [id number]     Arrival date & time 03/10/13  1021 History   First MD Initiated Contact with Patient 03/10/13 1046     Chief Complaint  Patient presents with  . Urinary Frequency  . Vaginal Discharge  . Abdominal Pain   (Consider location/radiation/quality/duration/timing/severity/associated sxs/prior Treatment) The history is provided by the patient. No language interpreter was used.  Fonnie Crookshanks is a 37 y/o F with PMhx of anemia, anxiety, depression, gastric bypass surgery presenting to the ED with lower abdominal pain that has been ongoing since Tuesday and has been getting progressively worse. Patient reported that the discomfort is described as a pressure sensation, feeling of the urge that she has to go to the bathroom. Patient reported that the pain radiates to her lower back which is described as an aching sensation. Patient reported that she has been having dysuria with each urination. Patient reported that she has been experiencing vaginal discharge described as a yellowish discoloration, denied having an odor. Patient reported that she has been experiencing vaginal pruritis - reported that she used Monostat 7 yesterday with minimal relief. Patient reported that her LMP was 02/17/2013. Patient reported that she feels like she is due for her period soon secondary to experiencing mild pelvic cramping that she normally gets one week before her menstrual cycle. Patient reported that she has been feeling mild nausea. Denied fever, chills, vomiting, diarrhea, melena, hematochezia, chest pain, shortness of breath, difficulty breathing, neck pain, neck stiffness. Denied birth control. Patient reported that she is sexually active without using protection.  PCP Dr. Marliss Coots  Past Medical History  Diagnosis Date  . Rubella     5 months old  . Varicella     32 1/38 years old  . Anemia     blood transfusion 09/14/10  . Anxiety   . Chronic right-sided headaches   . Depression    Past Surgical  History  Procedure Laterality Date  . Gastric bypass  2004  . Laparoscopic tubal ligation  2007   Family History  Problem Relation Age of Onset  . Cancer Father    History  Substance Use Topics  . Smoking status: Current Some Day Smoker -- 0.20 packs/day    Types: Cigarettes  . Smokeless tobacco: Never Used  . Alcohol Use: No   OB History   Grav Para Term Preterm Abortions TAB SAB Ect Mult Living   6 6 6  0 0 0 0 0 0 6     Review of Systems  Constitutional: Negative for fever and chills.  Respiratory: Negative for chest tightness and shortness of breath.   Cardiovascular: Negative for chest pain.  Gastrointestinal: Positive for nausea and abdominal pain. Negative for vomiting, constipation, blood in stool and anal bleeding.  Genitourinary: Positive for urgency, vaginal discharge and pelvic pain. Negative for vaginal bleeding and vaginal pain.  Musculoskeletal: Positive for back pain.  Neurological: Negative for dizziness and weakness.  All other systems reviewed and are negative.    Allergies  Other  Home Medications   Current Outpatient Rx  Name  Route  Sig  Dispense  Refill  . acetaminophen (TYLENOL) 500 MG tablet   Oral   Take 1,000 mg by mouth every 6 (six) hours as needed for mild pain.         . Multiple Vitamin (MULITIVITAMIN WITH MINERALS) TABS   Oral   Take 1 tablet by mouth daily.         Marland Kitchen doxycycline (VIBRAMYCIN) 100 MG capsule  Oral   Take 1 capsule (100 mg total) by mouth 2 (two) times daily. One po bid x 7 days   14 capsule   0    BP 97/51  Pulse 61  Temp(Src) 98.7 F (37.1 C) (Oral)  Resp 14  Wt 220 lb (99.791 kg)  SpO2 100%  LMP 02/17/2013 Physical Exam  Nursing note and vitals reviewed. Constitutional: She is oriented to person, place, and time. She appears well-developed and well-nourished. No distress.  HENT:  Head: Normocephalic and atraumatic.  Mouth/Throat: Oropharynx is clear and moist. No oropharyngeal exudate.    Negative oral lesions noted  Eyes: Conjunctivae and EOM are normal. Pupils are equal, round, and reactive to light. Right eye exhibits no discharge. Left eye exhibits no discharge.  Neck: Normal range of motion. Neck supple.  Cardiovascular: Normal rate, regular rhythm and normal heart sounds.  Exam reveals no friction rub.   No murmur heard. Pulses:      Radial pulses are 2+ on the right side, and 2+ on the left side.  Pulmonary/Chest: Effort normal and breath sounds normal. No respiratory distress. She has no wheezes. She has no rales.  Abdominal: Soft. Normal appearance and bowel sounds are normal. There is tenderness in the suprapubic area. There is no rigidity, no guarding, no tenderness at McBurney's point and negative Murphy's sign.    Obese Discomfort upon palpation to the right pelvic, suprapubic, and left pelvic region  Negative pain upon palpation to the epigastric region  Negative psoas and obturator Negative Murphy's sign  Negative McBurney's point  Genitourinary: Vaginal discharge found.  Negative swelling, erythema, inflammation, sores, lesions noted to the external genitalia. Negative swelling, erythema, lesions, sores, inflammation, fissures noted to the vaginal canal. Cervix was mild friability identified. Mild erythema noted. Thick white discharge noted. Negative bilateral adnexal tenderness. Suprapubic discomfort upon palpation noted. Cervical motion tenderness noted.  Exam chaperoned   Musculoskeletal: Normal range of motion.  Neurological: She is alert and oriented to person, place, and time. She exhibits normal muscle tone. Coordination normal.  Skin: Skin is warm and dry. No rash noted. She is not diaphoretic. No erythema.  Psychiatric: She has a normal mood and affect. Her behavior is normal. Thought content normal.    ED Course  Procedures (including critical care time)  12:31 PM This provider went to discuss with the patient regarding UTI infection  medication. Patient reported that she is allergic to a medication that starts with the letter "c," but she is unable to recall exactly what it is called. Patient reported that she normally takes Amoxicillin for her UTI.   This provider discussed case with Dr. Jinger Neighbors regarding abdominal pain mainly to be localized to the suprapubic region, pelvic discomfort, cervical motion tenderness, vaginal discharge noted. Dr. Jinger Neighbors did not believe CT scan of the abdomen needed to be performed at this time.  Reviewed labs with Dr. Jinger Neighbors recommended Doxycycline to be discharged with.   Results for orders placed during the hospital encounter of 03/10/13  WET PREP, GENITAL      Result Value Range   Yeast Wet Prep HPF POC NONE SEEN  NONE SEEN   Trich, Wet Prep NONE SEEN  NONE SEEN   Clue Cells Wet Prep HPF POC NONE SEEN  NONE SEEN   WBC, Wet Prep HPF POC MODERATE (*) NONE SEEN  URINALYSIS, ROUTINE W REFLEX MICROSCOPIC      Result Value Range   Color, Urine YELLOW  YELLOW   APPearance  TURBID (*) CLEAR   Specific Gravity, Urine 1.028  1.005 - 1.030   pH 5.5  5.0 - 8.0   Glucose, UA NEGATIVE  NEGATIVE mg/dL   Hgb urine dipstick NEGATIVE  NEGATIVE   Bilirubin Urine SMALL (*) NEGATIVE   Ketones, ur 15 (*) NEGATIVE mg/dL   Protein, ur NEGATIVE  NEGATIVE mg/dL   Urobilinogen, UA 1.0  0.0 - 1.0 mg/dL   Nitrite NEGATIVE  NEGATIVE   Leukocytes, UA LARGE (*) NEGATIVE  URINE MICROSCOPIC-ADD ON      Result Value Range   Squamous Epithelial / LPF FEW (*) RARE   WBC, UA 3-6  <3 WBC/hpf   RBC / HPF 0-2  <3 RBC/hpf   Bacteria, UA FEW (*) RARE  POCT PREGNANCY, URINE      Result Value Range   Preg Test, Ur NEGATIVE  NEGATIVE    Labs Review Labs Reviewed  WET PREP, GENITAL - Abnormal; Notable for the following:    WBC, Wet Prep HPF POC MODERATE (*)    All other components within normal limits  URINALYSIS, ROUTINE W REFLEX MICROSCOPIC - Abnormal; Notable for the following:    APPearance TURBID (*)     Bilirubin Urine SMALL (*)    Ketones, ur 15 (*)    Leukocytes, UA LARGE (*)    All other components within normal limits  URINE MICROSCOPIC-ADD ON - Abnormal; Notable for the following:    Squamous Epithelial / LPF FEW (*)    Bacteria, UA FEW (*)    All other components within normal limits  GC/CHLAMYDIA PROBE AMP  URINE CULTURE  POCT PREGNANCY, URINE   Imaging Review No results found.  EKG Interpretation   None       MDM   1. UTI (urinary tract infection)   2. PID (acute pelvic inflammatory disease)     Medications  cefTRIAXone (ROCEPHIN) injection 250 mg (not administered)  azithromycin (ZITHROMAX) powder 1 g (not administered)  ketorolac (TORADOL) injection 60 mg (60 mg Intramuscular Given 03/10/13 1247)  ondansetron (ZOFRAN-ODT) disintegrating tablet 4 mg (4 mg Oral Given 03/10/13 1246)  amoxicillin (AMOXIL) capsule 500 mg (500 mg Oral Given 03/10/13 1246)   Filed Vitals:   03/10/13 1028 03/10/13 1342  BP: 114/60 97/51  Pulse: 77 61  Temp: 98 F (36.7 C) 98.7 F (37.1 C)  TempSrc: Oral Oral  Resp: 16 14  Weight: 220 lb (99.791 kg)   SpO2: 100% 100%    Patient presenting to emergency department with lower suprapubic/pelvic pain that has been ongoing for the past 5 days with associated symptoms of dysuria, vaginal pruritis, vaginal discharge.  Alert and oriented. GCS 15. Heart rate and rhythm normal. Lungs clear to auscultation bilaterally to upper and lower lobes. Bowel sounds normal active in all 4 quadrants. Obese. Discomfort upon palpation to lower to suprapubic, left and right pelvic region. Mild friability of the cervix identified on pelvic exam. Vaginal discharge of thick white, negative odor identified. Negative adnexal tenderness bilaterally, most discomfort upon palpation to suprapubic region. Cervical motion tenderness noted.  Urinalysis noted large leukocytes with few bacteria noted - negative pyuria, negative nitrites. Urine pregnancy negative. Moderate  WBC noted to wet prep.  Patient presenting to the ED with UTI, PID. Doubt appendicitis. Negative acute abdomen, negative peritoneal signs. Suspicion to be more of a GU issue, rather than a GI issue - dysuria, vaginal discharge, vaginal pruritis, cervical motion tenderness. Patient stable, afebrile. Patient appears well. Non-septic appearing. Discharged patient with antibiotics. Referred patient  to PCP and [facility] outpatient clinic. Discussed with patient importance of getting pap smears every year. Discussed with patient to rest and stay hydrated. Discussed with patient to continue to monitor symptoms and if symptoms are to worsen or change to report back to the ED - strict return instruction given.  Patient agreed to plan of care, understood, all questions answered.      Raymon Mutton, PA-C 03/11/13 1410

## 2013-03-10 NOTE — ED Notes (Signed)
Pt is here with lower abdominal pain, pain with urination, and yellow vaginal discharge.  LMP:  Beginning of November

## 2013-03-11 LAB — URINE CULTURE: Colony Count: 50000

## 2013-03-11 NOTE — ED Provider Notes (Signed)
Medical screening examination/treatment/procedure(s) were performed by non-physician practitioner and as supervising physician I was immediately available for consultation/collaboration.  EKG Interpretation   None        Hurman Horn, MD 03/11/13 [phone]

## 2013-03-12 LAB — GC/CHLAMYDIA PROBE AMP: GC Probe RNA: NEGATIVE

## 2013-05-31 ENCOUNTER — Emergency Department (HOSPITAL_COMMUNITY): Payer: Medicaid Other

## 2013-05-31 ENCOUNTER — Encounter (HOSPITAL_COMMUNITY): Payer: Self-pay | Admitting: Emergency Medicine

## 2013-05-31 ENCOUNTER — Inpatient Hospital Stay (HOSPITAL_COMMUNITY)
Admission: EM | Admit: 2013-05-31 | Discharge: 2013-06-01 | DRG: 781 | Disposition: A | Discharge status: Home / Self Care | Payer: Medicaid Other | Attending: Internal Medicine | Admitting: Internal Medicine

## 2013-05-31 DIAGNOSIS — R55 Syncope and collapse: Secondary | ICD-10-CM | POA: Diagnosis present

## 2013-05-31 DIAGNOSIS — Z9884 Bariatric surgery status: Secondary | ICD-10-CM

## 2013-05-31 DIAGNOSIS — Z3490 Encounter for supervision of normal pregnancy, unspecified, unspecified trimester: Secondary | ICD-10-CM

## 2013-05-31 DIAGNOSIS — R1032 Left lower quadrant pain: Secondary | ICD-10-CM

## 2013-05-31 DIAGNOSIS — D638 Anemia in other chronic diseases classified elsewhere: Secondary | ICD-10-CM

## 2013-05-31 DIAGNOSIS — O99019 Anemia complicating pregnancy, unspecified trimester: Principal | ICD-10-CM | POA: Diagnosis present

## 2013-05-31 DIAGNOSIS — Z3491 Encounter for supervision of normal pregnancy, unspecified, first trimester: Secondary | ICD-10-CM

## 2013-05-31 DIAGNOSIS — D509 Iron deficiency anemia, unspecified: Secondary | ICD-10-CM | POA: Diagnosis present

## 2013-05-31 DIAGNOSIS — D649 Anemia, unspecified: Secondary | ICD-10-CM | POA: Diagnosis present

## 2013-05-31 LAB — BASIC METABOLIC PANEL
BUN: 15 mg/dL (ref 6–23)
CO2: 24 meq/L (ref 19–32)
Calcium: 9.5 mg/dL (ref 8.4–10.5)
Chloride: 103 mEq/L (ref 96–112)
Creatinine, Ser: 0.66 mg/dL (ref 0.50–1.10)
GFR calc Af Amer: >90 mL/min (ref >90)
GFR calc non Af Amer: >90 mL/min (ref >90)
GLUCOSE: 75 mg/dL (ref 70–99)
POTASSIUM: 4.4 meq/L (ref 3.7–5.3)
SODIUM: 138 meq/L (ref 137–147)

## 2013-05-31 LAB — URINALYSIS, ROUTINE W REFLEX MICROSCOPIC
Bilirubin Urine: NEGATIVE
GLUCOSE, UA: 100 mg/dL — AB
Hgb urine dipstick: NEGATIVE
KETONES UR: NEGATIVE mg/dL
Leukocytes, UA: NEGATIVE
NITRITE: NEGATIVE
PH: 5.5 (ref 5.0–8.0)
PROTEIN: NEGATIVE mg/dL
Specific Gravity, Urine: 1.026 (ref 1.005–1.030)
Urobilinogen, UA: 1 mg/dL (ref 0.0–1.0)

## 2013-05-31 LAB — CBC
HCT: 26 % — ABNORMAL LOW (ref 36.0–46.0)
HEMATOCRIT: 22.7 % — AB (ref 36.0–46.0)
HEMOGLOBIN: 7.5 g/dL — AB (ref 12.0–15.0)
Hemoglobin: 6.4 g/dL — CL (ref 12.0–15.0)
MCH: 18.2 pg — AB (ref 26.0–34.0)
MCH: 17.8 pg — ABNORMAL LOW (ref 26.0–34.0)
MCHC: 28.8 g/dL — ABNORMAL LOW (ref 30.0–36.0)
MCHC: 28.2 g/dL — AB (ref 30.0–36.0)
MCV: 63 fL — ABNORMAL LOW (ref 78.0–100.0)
MCV: 63.1 fL — AB (ref 78.0–100.0)
PLATELETS: 186 10*3/uL (ref 150–400)
Platelets: 231 10*3/uL (ref 150–400)
RBC: 4.13 MIL/uL (ref 3.87–5.11)
RBC: 3.6 MIL/uL — ABNORMAL LOW (ref 3.87–5.11)
RDW: 18 % — ABNORMAL HIGH (ref 11.5–15.5)
RDW: 17.8 % — AB (ref 11.5–15.5)
WBC: 6.7 10*3/uL (ref 4.0–10.5)
WBC: 5.8 10*3/uL (ref 4.0–10.5)

## 2013-05-31 LAB — RETICULOCYTES
RBC.: 3.94 MIL/uL (ref 3.87–5.11)
RETIC CT PCT: 0.7 % (ref 0.4–3.1)
Retic Count, Absolute: 27.6 10*3/uL (ref 19.0–186.0)

## 2013-05-31 LAB — POC OCCULT BLOOD, ED: Fecal Occult Bld: NEGATIVE

## 2013-05-31 LAB — I-STAT TROPONIN, ED: Troponin i, poc: 0 ng/mL (ref 0.00–0.08)

## 2013-05-31 LAB — HCG, QUANTITATIVE, PREGNANCY: hCG, Beta Chain, Quant, S: 271 m[IU]/mL — ABNORMAL HIGH (ref <5)

## 2013-05-31 LAB — POC URINE PREG, ED: Preg Test, Ur: POSITIVE — AB

## 2013-05-31 LAB — PREPARE RBC (CROSSMATCH)

## 2013-05-31 LAB — D-DIMER, QUANTITATIVE: D-Dimer, Quant: 0.65 ug/mL-FEU — ABNORMAL HIGH (ref 0.00–0.48)

## 2013-05-31 MED ORDER — ACETAMINOPHEN 650 MG RE SUPP: 650.0000 mg | Freq: Four times a day (QID) | RECTAL | Status: DC | PRN

## 2013-05-31 MED ORDER — PRENATAL MULTIVITAMIN CH: 1.0000 | ORAL_TABLET | Freq: Every day | ORAL | Status: DC

## 2013-05-31 MED ORDER — ACETAMINOPHEN 325 MG PO TABS
650.0000 mg | ORAL_TABLET | Freq: Four times a day (QID) | ORAL | Status: DC | PRN
Start: 2013-05-31 — End: 2013-06-01

## 2013-05-31 MED ORDER — FOLIC ACID 1 MG PO TABS
1.0000 mg | ORAL_TABLET | Freq: Every day | ORAL | Status: DC
  Administered 2013-05-31 – 2013-06-01 (×2): 1 mg via ORAL
  Filled 2013-05-31 (×3): qty 1

## 2013-05-31 MED ORDER — SODIUM CHLORIDE 0.9 % IV BOLUS (SEPSIS)
1000.0000 mL | Freq: Once | INTRAVENOUS | Status: AC
  Administered 2013-05-31: 1000 mL via INTRAVENOUS

## 2013-05-31 MED ORDER — SODIUM CHLORIDE 0.9 % IV BOLUS (SEPSIS): 1000.0000 mL | Freq: Once | INTRAVENOUS | Status: DC

## 2013-05-31 MED ORDER — PRENATAL MULTIVITAMIN CH
1.0000 | ORAL_TABLET | Freq: Every day | ORAL | Status: DC
  Administered 2013-05-31 – 2013-06-01 (×2): 1 via ORAL
  Filled 2013-05-31 (×2): qty 1

## 2013-05-31 MED ORDER — ONDANSETRON HCL 4 MG PO TABS: 4.0000 mg | ORAL_TABLET | Freq: Four times a day (QID) | ORAL | Status: DC | PRN

## 2013-05-31 MED ORDER — SODIUM CHLORIDE 0.9 % IV SOLN
INTRAVENOUS | Status: DC
  Administered 2013-06-01: 14:00:00 via INTRAVENOUS

## 2013-05-31 MED ORDER — ALUM & MAG HYDROXIDE-SIMETH 200-200-20 MG/5ML PO SUSP: 30.0000 mL | Freq: Four times a day (QID) | ORAL | Status: DC | PRN

## 2013-05-31 MED ORDER — ONDANSETRON HCL 4 MG/2ML IJ SOLN: 4.0000 mg | Freq: Four times a day (QID) | INTRAMUSCULAR | Status: DC | PRN

## 2013-05-31 NOTE — ED Notes (Signed)
Dr. Regenia Skeeter updating pt on results. Husband given sprite

## 2013-05-31 NOTE — ED Notes (Signed)
Pt aware CBC to be collected after bolus.

## 2013-05-31 NOTE — ED Notes (Signed)
Lab called to inform not to run CBC. sts will hold

## 2013-05-31 NOTE — H&P (Signed)
[facility] History and Physical  Carrie Douglas JOI:[id number] DOB: Jul 31, 1975 DOA: 05/31/2013  Referring physician: Dr. Tiajuana Amass PCP: Philis Fendt, MD   Chief Complaint: syncope  HPI: Carrie Douglas is a 38 y.o. female  38 year old female past medical history of anemia, and gastric bypass presents after near-syncope on the day of admission . The patient states that a few hours prior to arrival she was standing in the kitchen cooking and felt acute dizziness like she's got a pass out.She relates get these often as she has chronic anemia. She denies chest pain now.  She has not had any vaginal bleeding since last month when she had a normal period. States he normally gets heavy periods. Denies any blood in her stool or diarrhea. No recent vomiting. She had a gastric bypass several years ago and since has been chronically anemic despite taking iron pills. She relates dizziness upon standing and when exerting herself.  In the ED: CBC was done that shows a hemoglobin of 7.5-2 L of IV fluids as the patient was complaining of orthostatic hypotension a CBC was checked which showed a hemoglobin of 6.4. Pregnancy test was checked which was positive.   Review of Systems:  Constitutional:  No weight loss, night sweats, Fevers, chills, fatigue.  HEENT:  No headaches, Difficulty swallowing,Tooth/dental problems,Sore throat,  No sneezing, itching, ear ache, nasal congestion, post nasal drip,  Cardio-vascular:  No chest pain, Orthopnea, PND, swelling in lower extremities, anasarca, dizziness, palpitations  GI:  No heartburn, indigestion, abdominal pain, nausea, vomiting, diarrhea, change in bowel habits, loss of appetite  Resp:  No shortness of breath with exertion or at rest. No excess mucus, no productive cough, No non-productive cough, No coughing up of blood.No change in color of mucus.No wheezing.No chest wall deformity  Skin:  no rash or lesions.  GU:  no dysuria, change in color of urine,  no urgency or frequency. No flank pain.  Musculoskeletal:  No joint pain or swelling. No decreased range of motion. No back pain.  Psych:  No change in mood or affect. No depression or anxiety. No memory loss.   Past Medical History  Diagnosis Date  . Rubella     5 months old  . Varicella     36 1/38 years old  . Anemia     blood transfusion 09/14/10  . Anxiety   . Chronic right-sided headaches   . Depression    Past Surgical History  Procedure Laterality Date  . Gastric bypass  2004  . Laparoscopic tubal ligation  2007   Social History:  reports that she has been smoking Cigarettes.  She has been smoking about 0.20 packs per day. She has never used smokeless tobacco. She reports that she does not drink alcohol or use illicit drugs.  Allergies  Allergen Reactions  . Other Itching    Burning Antibiotic for urinary tract infection:  Thinks cipro    Family History  Problem Relation Age of Onset  . Lung cancer Father      Prior to Admission medications   Medication Sig Start Date End Date Taking? Authorizing Provider  IRON PO Take 1 tablet by mouth daily.   Yes Historical Provider, MD   Physical Exam: Filed Vitals:   05/31/13 1730  BP: 115/77  Pulse: 65  Resp: 14    BP 115/77  Pulse 65  Resp 14  SpO2 100%  LMP 05/01/2013  General:  Appears calm and comfortable Eyes: PERRL, normal lids, irises & conjunctiva ENT: grossly  normal hearing, lips & tongue Neck: no LAD, masses or thyromegaly Cardiovascular: RRR, no m/r/g. No LE edema. Respiratory: CTA bilaterally, no w/r/r. Normal respiratory effort. Abdomen: soft, ntnd Skin: no rash or induration seen on limited exam Musculoskeletal: grossly normal tone BUE/BLE Psychiatric: grossly normal mood and affect, speech fluent and appropriate Neurologic: grossly non-focal.          Labs on Admission:  Basic Metabolic Panel:  Recent Labs Lab 05/31/13 1200  NA 138  K 4.4  CL 103  CO2 24  GLUCOSE 75  BUN 15    CREATININE 0.66  CALCIUM 9.5   Liver Function Tests: No results found for this basename: AST, ALT, ALKPHOS, BILITOT, PROT, ALBUMIN,  in the last 168 hours No results found for this basename: LIPASE, AMYLASE,  in the last 168 hours No results found for this basename: AMMONIA,  in the last 168 hours CBC:  Recent Labs Lab 05/31/13 1200 05/31/13 1600  WBC 6.7 5.8  HGB 7.5* 6.4*  HCT 26.0* 22.7*  MCV 63.0* 63.1*  PLT 231 186   Cardiac Enzymes: No results found for this basename: CKTOTAL, CKMB, CKMBINDEX, TROPONINI,  in the last 168 hours  BNP (last 3 results) No results found for this basename: PROBNP,  in the last 8760 hours CBG: No results found for this basename: GLUCAP,  in the last 168 hours  Radiological Exams on Admission: Dg Chest 2 View  05/31/2013   CLINICAL DATA:  Dizziness  EXAM: CHEST  2 VIEW  COMPARISON:  09/06/2012  FINDINGS: The heart size and mediastinal contours are within normal limits. Both lungs are clear. The visualized skeletal structures are unremarkable.  IMPRESSION: No active cardiopulmonary disease.   Electronically Signed   By: Inez Catalina M.D.   On: 05/31/2013 13:47   US Ob Comp Less 14 Wks  05/31/2013   CLINICAL DATA:  Pregnancy.  Near syncope.  EXAM: OBSTETRIC <14 WK [place] AND TRANSVAGINAL OB US  TECHNIQUE: Both transabdominal and transvaginal ultrasound examinations were performed for complete evaluation of the gestation as well as the maternal uterus, adnexal regions, and pelvic cul-de-sac. Transvaginal technique was performed to assess early pregnancy.  COMPARISON:  None.  FINDINGS: Intrauterine gestational sac: A probable gestational sac is present with a tiny hypoechoic area centrally within the endometrial canal measuring 5.9 mm in compatible with an estimated gestational age of [redacted] weeks and 2 days. This corresponds with the gestational age by LMP of 4 weeks and 6 days.  Yolk sac:  Not seen.  Embryo:  Not seen.  Cardiac Activity: Not seen.  MSD:  5.9  mm    5 w   2  d              [place] EDC: 01/29/2014  Maternal uterus/adnexae: The uterus is otherwise unremarkable. There is no significant subchorionic hemorrhage. The right ovary is within normal limits. Left ovary demonstrates a complex cyst measuring 3.5 x 3.2 x 3.4 cm.  IMPRESSION: 1. Probable entry dural gestational sac rim with a mean sac diameter of 5.9 mm corresponding with an estimated gestational age of [redacted] weeks and 2 days. Probable early intrauterine gestational sac, but no yolk sac, fetal pole, or cardiac activity yet visualized. Recommend follow-up quantitative B-HCG levels and follow-up US in 14 days to confirm and assess viability. This recommendation follows SRU consensus guidelines: Diagnostic Criteria for Nonviable Pregnancy Early in the First Trimester. [place] Med 2013; 161:0960-45. 2. Complex cystic lesion of the left adnexae.  Electronically Signed   By: Lawrence Santiago M.D.   On: 05/31/2013 14:41   US Ob Transvaginal  05/31/2013   CLINICAL DATA:  Pregnancy.  Near syncope.  EXAM: OBSTETRIC <14 WK [place] AND TRANSVAGINAL OB US  TECHNIQUE: Both transabdominal and transvaginal ultrasound examinations were performed for complete evaluation of the gestation as well as the maternal uterus, adnexal regions, and pelvic cul-de-sac. Transvaginal technique was performed to assess early pregnancy.  COMPARISON:  None.  FINDINGS: Intrauterine gestational sac: A probable gestational sac is present with a tiny hypoechoic area centrally within the endometrial canal measuring 5.9 mm in compatible with an estimated gestational age of [redacted] weeks and 2 days. This corresponds with the gestational age by LMP of 4 weeks and 6 days.  Yolk sac:  Not seen.  Embryo:  Not seen.  Cardiac Activity: Not seen.  MSD:  5.9  mm   5 w   2  d              [place] EDC: 01/29/2014  Maternal uterus/adnexae: The uterus is otherwise unremarkable. There is no significant subchorionic hemorrhage. The right ovary is within normal limits. Left ovary  demonstrates a complex cyst measuring 3.5 x 3.2 x 3.4 cm.  IMPRESSION: 1. Probable entry dural gestational sac rim with a mean sac diameter of 5.9 mm corresponding with an estimated gestational age of [redacted] weeks and 2 days. Probable early intrauterine gestational sac, but no yolk sac, fetal pole, or cardiac activity yet visualized. Recommend follow-up quantitative B-HCG levels and follow-up US in 14 days to confirm and assess viability. This recommendation follows SRU consensus guidelines: Diagnostic Criteria for Nonviable Pregnancy Early in the First Trimester. [employer] Med [id number]:[id number]. 2. Complex cystic lesion of the left adnexae.   Electronically Signed   By: Lawrence Santiago M.D.   On: 05/31/2013 14:41    EKG: Independently reviewed. Normal sinus rhythm nonspecific T wave changes.  Assessment/Plan Syncope and collapse due to Anemia: - Dizziness upon standing, weakness generalized fatigue most likely due to anemia. She had 2 L of normal saline in the ED. Ahead and give her an additional bolus and continue her at 125. We'll also transfuse her 2 units of packed red blood cells check an anemia panel and for transfusion and check a CBC posttransfusion. - Start her on prenatal vitamins. If her ferritin is low she might require IV iron.   Code Status: full Family Communication: husband Disposition Plan: inpatient  Time spent: 72 minutes  Carrie Douglas [facility] Pager [phone]

## 2013-05-31 NOTE — ED Notes (Signed)
Pt reports hx of anemia, felt fine this am when she woke up. Was standing in the kitchen and sudden onset of feeling dizzy and lightheaded. Then onset of right side chest pains when walking. ekg done at triage, airway intact.

## 2013-05-31 NOTE — ED Provider Notes (Addendum)
CSN: [id number]     Arrival date & time 05/31/13  1106 History   First MD Initiated Contact with Patient 05/31/13 1121     Chief Complaint  Patient presents with  . Dizziness  . Chest Pain     (Consider location/radiation/quality/duration/timing/severity/associated sxs/prior Treatment) HPI Comments: 38 year old female presents after near-syncope. The patient states that a few hours prior to arrival she was standing in the kitchen cooking and felt acute dizziness like she's got a pass out. She states she normally gets dizziness like this almost every day injury to to her chronic anemia but states today she got as close to passing out as ever. She denies any palpitations. She states during this time she does seem to have some right-sided chest pain but thinks it was more anxiety. She denies chest pain now. Denies any acute headaches. No focal weakness but feels diffusely tired and weak. She has not had any vaginal bleeding since last month when she had a normal period. States he normally gets heavy periods. Denies any blood in her stool or diarrhea. No recent vomiting. She had a gastric bypass several years ago and since has been chronically anemic despite taking iron pills. She used to be on iron infusions which she lived in [place] but has not done that in several months to years.     Past Medical History  Diagnosis Date  . Rubella     5 months old  . Varicella     65 1/38 years old  . Anemia     blood transfusion 09/14/10  . Anxiety   . Chronic right-sided headaches   . Depression    Past Surgical History  Procedure Laterality Date  . Gastric bypass  2004  . Laparoscopic tubal ligation  2007   Family History  Problem Relation Age of Onset  . Cancer Father    History  Substance Use Topics  . Smoking status: Current Some Day Smoker -- 0.20 packs/day    Types: Cigarettes  . Smokeless tobacco: Never Used  . Alcohol Use: No   OB History   Grav Para Term Preterm Abortions TAB  SAB Ect Mult Living   6 6 6  0 0 0 0 0 0 6     Review of Systems  Constitutional: Positive for fatigue. Negative for fever.  Respiratory: Negative for cough and shortness of breath.   Cardiovascular: Positive for chest pain. Negative for leg swelling.  Gastrointestinal: Negative for nausea, vomiting, abdominal pain, diarrhea and blood in stool.  Genitourinary: Negative for dysuria.  Musculoskeletal: Negative for back pain.  Neurological: Positive for dizziness, weakness and light-headedness. Negative for syncope.  All other systems reviewed and are negative.      Allergies  Other  Home Medications   Current Outpatient Rx  Name  Route  Sig  Dispense  Refill  . IRON PO   Oral   Take 1 tablet by mouth daily.          BP 107/51  Pulse 65  Resp 21  SpO2 100%  LMP 05/01/2013 Physical Exam  Nursing note and vitals reviewed. Constitutional: She is oriented to person, place, and time. She appears well-developed and well-nourished.  HENT:  Head: Normocephalic and atraumatic.  Right Ear: External ear normal.  Left Ear: External ear normal.  Nose: Nose normal.  Eyes: Right eye exhibits no discharge. Left eye exhibits no discharge.  Cardiovascular: Normal rate, regular rhythm and normal heart sounds.   Pulmonary/Chest: Effort normal and breath sounds normal.  She has no wheezes. She has no rales.  Abdominal: Soft. There is no tenderness.  Neurological: She is alert and oriented to person, place, and time. She has normal strength. No cranial nerve deficit or sensory deficit. GCS eye subscore is 4. GCS verbal subscore is 5. GCS motor subscore is 6.  Skin: Skin is warm and dry.    ED Course  Procedures (including critical care time) Labs Review Labs Reviewed  CBC - Abnormal; Notable for the following:    Hemoglobin 7.5 (*)    HCT 26.0 (*)    MCV 63.0 (*)    MCH 18.2 (*)    MCHC 28.8 (*)    RDW 18.0 (*)    All other components within normal limits  URINALYSIS, ROUTINE W  REFLEX MICROSCOPIC - Abnormal; Notable for the following:    Glucose, UA 100 (*)    All other components within normal limits  D-DIMER, QUANTITATIVE - Abnormal; Notable for the following:    D-Dimer, Quant 0.65 (*)    All other components within normal limits  HCG, QUANTITATIVE, PREGNANCY - Abnormal; Notable for the following:    hCG, Beta Chain, Quant, S 271 (*)    All other components within normal limits  CBC - Abnormal; Notable for the following:    RBC 3.60 (*)    Hemoglobin 6.4 (*)    HCT 22.7 (*)    MCV 63.1 (*)    MCH 17.8 (*)    MCHC 28.2 (*)    RDW 17.8 (*)    All other components within normal limits  POC URINE PREG, ED - Abnormal; Notable for the following:    Preg Test, Ur POSITIVE (*)    All other components within normal limits  BASIC METABOLIC PANEL  I-STAT TROPOININ, ED  POC OCCULT BLOOD, ED  PREPARE RBC (CROSSMATCH)  TYPE AND SCREEN   Imaging Review Dg Chest 2 View  05/31/2013   CLINICAL DATA:  Dizziness  EXAM: CHEST  2 VIEW  COMPARISON:  09/06/2012  FINDINGS: The heart size and mediastinal contours are within normal limits. Both lungs are clear. The visualized skeletal structures are unremarkable.  IMPRESSION: No active cardiopulmonary disease.   Electronically Signed   By: Inez Catalina M.D.   On: 05/31/2013 13:47   US Ob Comp Less 14 Wks  05/31/2013   CLINICAL DATA:  Pregnancy.  Near syncope.  EXAM: OBSTETRIC <14 WK [place] AND TRANSVAGINAL OB US  TECHNIQUE: Both transabdominal and transvaginal ultrasound examinations were performed for complete evaluation of the gestation as well as the maternal uterus, adnexal regions, and pelvic cul-de-sac. Transvaginal technique was performed to assess early pregnancy.  COMPARISON:  None.  FINDINGS: Intrauterine gestational sac: A probable gestational sac is present with a tiny hypoechoic area centrally within the endometrial canal measuring 5.9 mm in compatible with an estimated gestational age of [redacted] weeks and 2 days. This  corresponds with the gestational age by LMP of 4 weeks and 6 days.  Yolk sac:  Not seen.  Embryo:  Not seen.  Cardiac Activity: Not seen.  MSD:  5.9  mm   5 w   2  d              [place] EDC: 01/29/2014  Maternal uterus/adnexae: The uterus is otherwise unremarkable. There is no significant subchorionic hemorrhage. The right ovary is within normal limits. Left ovary demonstrates a complex cyst measuring 3.5 x 3.2 x 3.4 cm.  IMPRESSION: 1. Probable entry dural gestational sac rim with  a mean sac diameter of 5.9 mm corresponding with an estimated gestational age of [redacted] weeks and 2 days. Probable early intrauterine gestational sac, but no yolk sac, fetal pole, or cardiac activity yet visualized. Recommend follow-up quantitative B-HCG levels and follow-up US in 14 days to confirm and assess viability. This recommendation follows SRU consensus guidelines: Diagnostic Criteria for Nonviable Pregnancy Early in the First Trimester. [facility] 2013WM:[id number]. 2. Complex cystic lesion of the left adnexae.   Electronically Signed   By: Lawrence Santiago M.D.   On: 05/31/2013 14:41   US Ob Transvaginal  05/31/2013   CLINICAL DATA:  Pregnancy.  Near syncope.  EXAM: OBSTETRIC <14 WK [place] AND TRANSVAGINAL OB US  TECHNIQUE: Both transabdominal and transvaginal ultrasound examinations were performed for complete evaluation of the gestation as well as the maternal uterus, adnexal regions, and pelvic cul-de-sac. Transvaginal technique was performed to assess early pregnancy.  COMPARISON:  None.  FINDINGS: Intrauterine gestational sac: A probable gestational sac is present with a tiny hypoechoic area centrally within the endometrial canal measuring 5.9 mm in compatible with an estimated gestational age of [redacted] weeks and 2 days. This corresponds with the gestational age by LMP of 4 weeks and 6 days.  Yolk sac:  Not seen.  Embryo:  Not seen.  Cardiac Activity: Not seen.  MSD:  5.9  mm   5 w   2  d              [place] EDC: 01/29/2014  Maternal  uterus/adnexae: The uterus is otherwise unremarkable. There is no significant subchorionic hemorrhage. The right ovary is within normal limits. Left ovary demonstrates a complex cyst measuring 3.5 x 3.2 x 3.4 cm.  IMPRESSION: 1. Probable entry dural gestational sac rim with a mean sac diameter of 5.9 mm corresponding with an estimated gestational age of [redacted] weeks and 2 days. Probable early intrauterine gestational sac, but no yolk sac, fetal pole, or cardiac activity yet visualized. Recommend follow-up quantitative B-HCG levels and follow-up US in 14 days to confirm and assess viability. This recommendation follows SRU consensus guidelines: Diagnostic Criteria for Nonviable Pregnancy Early in the First Trimester. [facility] [employer] [id number]. 2. Complex cystic lesion of the left adnexae.   Electronically Signed   By: Lawrence Santiago M.D.   On: 05/31/2013 14:41    EKG Interpretation    Date/Time:  Thursday May 31 2013 11:11:15 EST Ventricular Rate:  90 PR Interval:  160 QRS Duration: 68 QT Interval:  340 QTC Calculation: 415 R Axis:   57 Text Interpretation:  Normal sinus rhythm Nonspecific ST abnormality Abnormal ECG No significant change since last tracing Confirmed by [place] ([id number]) on 05/31/2013 11:36:38 AM            MDM   Final diagnoses:  Anemia  First trimester pregnancy  Near syncope    Patient appears a little dry, is orthostatic. Given fluids, and given she feels like this is prior anemia, a CBC was rechecked and shows drop to 6.5. Due to this will need transfusion. No obvious source. Patient also found to be pregnant, but no abd tenderness or vaginal bleeding. U/S shows IUP but early. Will need admission for blood transfusion. Given her transient CP a ddimer was sent and is just above threshold. Patient not symptomatic now, and I discussed that I cannot r/o PE w/o CT. However there are risks to getting CT to fetus and thus patient declines, knowing how we  could  be missing PE. Given this, I feel her sx are explained by anemia and doubt PE.     Ephraim Hamburger, MD 05/31/13 2126  Ephraim Hamburger, MD 05/31/13 2128

## 2013-05-31 NOTE — ED Notes (Signed)
Lab called sts CBC is running

## 2013-06-01 DIAGNOSIS — Z3490 Encounter for supervision of normal pregnancy, unspecified, unspecified trimester: Secondary | ICD-10-CM

## 2013-06-01 DIAGNOSIS — R1032 Left lower quadrant pain: Secondary | ICD-10-CM

## 2013-06-01 DIAGNOSIS — D638 Anemia in other chronic diseases classified elsewhere: Secondary | ICD-10-CM

## 2013-06-01 LAB — FOLATE: Folate: 9.3 ng/mL

## 2013-06-01 LAB — CBC
HCT: 30.8 % — ABNORMAL LOW (ref 36.0–46.0)
HEMATOCRIT: 28.6 % — AB (ref 36.0–46.0)
HEMOGLOBIN: 8.6 g/dL — AB (ref 12.0–15.0)
Hemoglobin: 9.1 g/dL — ABNORMAL LOW (ref 12.0–15.0)
MCH: 20.2 pg — ABNORMAL LOW (ref 26.0–34.0)
MCH: 20 pg — ABNORMAL LOW (ref 26.0–34.0)
MCHC: 30.1 g/dL (ref 30.0–36.0)
MCHC: 29.5 g/dL — AB (ref 30.0–36.0)
MCV: 67.3 fL — AB (ref 78.0–100.0)
MCV: 67.7 fL — AB (ref 78.0–100.0)
Platelets: 203 10*3/uL (ref 150–400)
Platelets: 230 10*3/uL (ref 150–400)
RBC: 4.25 MIL/uL (ref 3.87–5.11)
RBC: 4.55 MIL/uL (ref 3.87–5.11)
RDW: 21.7 % — ABNORMAL HIGH (ref 11.5–15.5)
RDW: 21.4 % — ABNORMAL HIGH (ref 11.5–15.5)
WBC: 6.1 10*3/uL (ref 4.0–10.5)
WBC: 6.3 10*3/uL (ref 4.0–10.5)

## 2013-06-01 LAB — COMPREHENSIVE METABOLIC PANEL
ALBUMIN: 3.3 g/dL — AB (ref 3.5–5.2)
ALK PHOS: 51 U/L (ref 39–117)
ALT: 7 U/L (ref 0–35)
AST: 12 U/L (ref 0–37)
BUN: 9 mg/dL (ref 6–23)
CALCIUM: 8.6 mg/dL (ref 8.4–10.5)
CO2: 22 mEq/L (ref 19–32)
Chloride: 107 mEq/L (ref 96–112)
Creatinine, Ser: 0.62 mg/dL (ref 0.50–1.10)
GFR calc non Af Amer: >90 mL/min (ref >90)
GLUCOSE: 84 mg/dL (ref 70–99)
Potassium: 4.2 mEq/L (ref 3.7–5.3)
SODIUM: 141 meq/L (ref 137–147)
TOTAL PROTEIN: 6.3 g/dL (ref 6.0–8.3)
Total Bilirubin: 0.5 mg/dL (ref 0.3–1.2)

## 2013-06-01 LAB — IRON AND TIBC
Iron: 10 ug/dL — ABNORMAL LOW (ref 42–135)
Saturation Ratios: 3 % — ABNORMAL LOW (ref 20–55)
TIBC: 379 ug/dL (ref 250–470)
UIBC: 369 ug/dL (ref 125–400)

## 2013-06-01 LAB — FERRITIN: Ferritin: <1 ng/mL — ABNORMAL LOW (ref 10–291)

## 2013-06-01 LAB — VITAMIN B12: VITAMIN B 12: 244 pg/mL (ref 211–911)

## 2013-06-01 MED ORDER — DOCUSATE SODIUM 100 MG PO CAPS: 100.0000 mg | ORAL_CAPSULE | Freq: Two times a day (BID) | ORAL | Status: DC

## 2013-06-01 MED ORDER — SODIUM CHLORIDE 0.9 % IV SOLN
125.0000 mg | Freq: Once | INTRAVENOUS | Status: AC
  Administered 2013-06-01: 125 mg via INTRAVENOUS
  Filled 2013-06-01: qty 10

## 2013-06-01 MED ORDER — DOCUSATE SODIUM 100 MG PO CAPS
100.0000 mg | ORAL_CAPSULE | Freq: Once | ORAL | Status: AC
  Administered 2013-06-01: 100 mg via ORAL

## 2013-06-01 MED ORDER — PRENATAL MULTIVITAMIN CH: 1.0000 | ORAL_TABLET | Freq: Every day | ORAL | Status: DC

## 2013-06-01 NOTE — Discharge Instructions (Signed)
Ms. Mehan, you were admitted for iron deficiency anemia.  This is likely related to your not being able to absorb iron very well in your vitamins.  You were given a blood transfusion and intravenous iron and did very well with these.  Your Hgb (blood count) is much improved and you have told me that you feel better!  This is very good news.   Please start taking the prenatal vitamin at home as you were found to be pregnant on a pregnancy test.    Please contact your OB/GYN today if possible and set up an appointment.  Please discuss with them how your iron deficiency should be handled throughout your pregnancy (you may need further iron infusions).    Please also discuss whether you need to take iron pills in addition to the iron in your prenatal vitamin.    Since iron makes you constipated, you can take over the counter colace up to twice a day.  Also, discuss this issue further with your OB/GYN.

## 2013-06-01 NOTE — Discharge Summary (Signed)
Physician Discharge Summary  Carrie Douglas GHW:[id number] DOB: 1975/06/12 DOA: 05/31/2013  PCP: Carrie Fendt, MD  Admit date: 05/31/2013 Discharge date: 06/01/2013  Recommendations for Outpatient Follow-up:  1. Pt will need to follow up with PCP in 2-3 weeks post discharge 2. Please also check CBC to evaluate Hg and Hct levels, consider iron panel for iron deficiency anemia as she received IV iron 3. The patient was found to be pregnant, will need good follow up care for iron deficiency and anemia.   Discharge Diagnoses:  Active Problems:   Anemia   Syncope and collapse   Pregnancy   Discharge Condition: Stable  Diet recommendation: Regular  History of present illness (from H&P):  38 year old female past medical history of anemia, and gastric bypass presents after near-syncope on the day of admission . The patient states that a few hours prior to arrival she was standing in the kitchen cooking and felt acute dizziness like she's got a pass out.  She relates that she gets these often as she has chronic anemia.  She has not had any vaginal bleeding since last month when she had a normal period. States she normally gets heavy periods. Denies any blood in her stool or diarrhea. No recent vomiting. She had a gastric bypass several years ago and since has been chronically anemic despite taking iron pills. She relates dizziness upon standing and when exerting herself. She reports needing iron transfusions many times.  Hgb after hydration was 6.4, pregnancy test +.    Hospital Course:   Iron deficiency Anemia: Apparently a chronic issue for [name].  She reports having iron infusions many times.  She received 2 Units of PRBCs while in house and her Hgb improved from 6.4 - 8.6 and a further recheck on day of discharge was 9.1.  She also received IV ferric gluconate given her low ferritin.  She was feeling much improved at the time of discharge.  Was provided with an iron containing prenatal  vitamin and colace for constipation.   Syncope and collapse: No further episodes on day of discharge, related to Anemia problem above  Pregnancy: Advised to seek out prenatal care.  Given Rx for prenatal vitamins.  Ferric gluconate listed as pregnancy class B.     Procedures/Studies: Dg Chest 2 View  05/31/2013   CLINICAL DATA:  Dizziness  EXAM: CHEST  2 VIEW  COMPARISON:  09/06/2012  FINDINGS: The heart size and mediastinal contours are within normal limits. Both lungs are clear. The visualized skeletal structures are unremarkable.  IMPRESSION: No active cardiopulmonary disease.   Electronically Signed   By: Carrie Douglas M.D.   On: 05/31/2013 13:47   US Ob Comp Less 14 Wks  05/31/2013   CLINICAL DATA:  Pregnancy.  Near syncope.  EXAM: OBSTETRIC <14 WK [place] AND TRANSVAGINAL OB US  TECHNIQUE: Both transabdominal and transvaginal ultrasound examinations were performed for complete evaluation of the gestation as well as the maternal uterus, adnexal regions, and pelvic cul-de-sac. Transvaginal technique was performed to assess early pregnancy.  COMPARISON:  None.  FINDINGS: Intrauterine gestational sac: A probable gestational sac is present with a tiny hypoechoic area centrally within the endometrial canal measuring 5.9 mm in compatible with an estimated gestational age of [redacted] weeks and 2 days. This corresponds with the gestational age by LMP of 4 weeks and 6 days.  Yolk sac:  Not seen.  Embryo:  Not seen.  Cardiac Activity: Not seen.  MSD:  5.9  mm   5 w  2  d              [place] EDC: 01/29/2014  Maternal uterus/adnexae: The uterus is otherwise unremarkable. There is no significant subchorionic hemorrhage. The right ovary is within normal limits. Left ovary demonstrates a complex cyst measuring 3.5 x 3.2 x 3.4 cm.  IMPRESSION: 1. Probable entry dural gestational sac rim with a mean sac diameter of 5.9 mm corresponding with an estimated gestational age of [redacted] weeks and 2 days. Probable early intrauterine gestational  sac, but no yolk sac, fetal pole, or cardiac activity yet visualized. Recommend follow-up quantitative B-HCG levels and follow-up US in 14 days to confirm and assess viability. This recommendation follows SRU consensus guidelines: Diagnostic Criteria for Nonviable Pregnancy Early in the First Trimester. [facility] [id number]. 2. Complex cystic lesion of the left adnexae.   Electronically Signed   By: Carrie Douglas M.D.   On: 05/31/2013 14:41   US Ob Transvaginal  05/31/2013   CLINICAL DATA:  Pregnancy.  Near syncope.  EXAM: OBSTETRIC <14 WK [place] AND TRANSVAGINAL OB US  TECHNIQUE: Both transabdominal and transvaginal ultrasound examinations were performed for complete evaluation of the gestation as well as the maternal uterus, adnexal regions, and pelvic cul-de-sac. Transvaginal technique was performed to assess early pregnancy.  COMPARISON:  None.  FINDINGS: Intrauterine gestational sac: A probable gestational sac is present with a tiny hypoechoic area centrally within the endometrial canal measuring 5.9 mm in compatible with an estimated gestational age of [redacted] weeks and 2 days. This corresponds with the gestational age by LMP of 4 weeks and 6 days.  Yolk sac:  Not seen.  Embryo:  Not seen.  Cardiac Activity: Not seen.  MSD:  5.9  mm   5 w   2  d              [place] EDC: 01/29/2014  Maternal uterus/adnexae: The uterus is otherwise unremarkable. There is no significant subchorionic hemorrhage. The right ovary is within normal limits. Left ovary demonstrates a complex cyst measuring 3.5 x 3.2 x 3.4 cm.  IMPRESSION: 1. Probable entry dural gestational sac rim with a mean sac diameter of 5.9 mm corresponding with an estimated gestational age of [redacted] weeks and 2 days. Probable early intrauterine gestational sac, but no yolk sac, fetal pole, or cardiac activity yet visualized. Recommend follow-up quantitative B-HCG levels and follow-up US in 14 days to confirm and assess viability. This recommendation follows SRU  consensus guidelines: Diagnostic Criteria for Nonviable Pregnancy Early in the First Trimester. [facility] [id number]. 2. Complex cystic lesion of the left adnexae.   Electronically Signed   By: Carrie Douglas M.D.   On: 05/31/2013 14:41     Discharge Exam: Filed Vitals:   06/01/13 0540  BP: 102/58  Pulse:   Temp:   Resp:    Filed Vitals:   06/01/13 0246 06/01/13 0350 06/01/13 0509 06/01/13 0540  BP: 99/54 98/47 104/37 102/58  Pulse: 76 60 64   Temp: 98.2 F (36.8 C) 98.6 F (37 C) 98.3 F (36.8 C)   TempSrc: Oral Oral Oral   Resp: 16 16 20    Height:      Weight:      SpO2: 100% 98% 98%     General: Pt is alert, follows commands appropriately, not in acute distress Cardiovascular: Regular rate and rhythm, S1/S2 +, no murmurs Respiratory: Clear to auscultation bilaterally, no wheezing Abdominal: Soft, non tender, non distended, bowel sounds +  Extremities: no edema, no cyanosis Neuro: Grossly nonfocal  Discharge Instructions     Medication List         docusate sodium 100 MG capsule  Commonly known as:  COLACE  Take 1 capsule (100 mg total) by mouth 2 (two) times daily.     IRON PO  Take 1 tablet by mouth daily.     prenatal multivitamin Tabs tablet  Take 1 tablet by mouth daily at 12 noon.           Follow-up Information   Follow up with AVBUERE,EDWIN A, MD. Schedule an appointment as soon as possible for a visit in 2 weeks.   Specialty:  Internal Medicine   Contact information:   [address] [place] [address] [phone]        The results of significant diagnostics from this hospitalization (including imaging, microbiology, ancillary and laboratory) are listed below for reference.     Microbiology: No results found for this or any previous visit (from the past 240 hour(s)).   Labs: Basic Metabolic Panel:  Recent Labs Lab 05/31/13 1200 06/01/13 0600  NA 138 141  K 4.4 4.2  CL 103 107  CO2 24 22  GLUCOSE 75 84   BUN 15 9  CREATININE 0.66 0.62  CALCIUM 9.5 8.6   Liver Function Tests:  Recent Labs Lab 06/01/13 0600  AST 12  ALT 7  ALKPHOS 51  BILITOT 0.5  PROT 6.3  ALBUMIN 3.3*   No results found for this basename: LIPASE, AMYLASE,  in the last 168 hours No results found for this basename: AMMONIA,  in the last 168 hours CBC:  Recent Labs Lab 05/31/13 1200 05/31/13 1600 06/01/13 0600 06/01/13 1134  WBC 6.7 5.8 6.1 6.3  HGB 7.5* 6.4* 8.6* 9.1*  HCT 26.0* 22.7* 28.6* 30.8*  MCV 63.0* 63.1* 67.3* 67.7*  PLT 231 186 203 230     SIGNED: Time coordinating discharge: 35 minutes  MULLEN, EMILY, MD  [facility] 06/01/2013, 1:50 PM Pager [phone]  If 7PM-7AM, please contact night-coverage [email or web address] Password [email or web address]

## 2013-06-02 LAB — TYPE AND SCREEN
ABO/RH(D): A POS
Antibody Screen: NEGATIVE
UNIT DIVISION: 0
Unit division: 0

## 2013-07-10 ENCOUNTER — Encounter: Payer: Self-pay | Admitting: Obstetrics and Gynecology

## 2013-07-10 ENCOUNTER — Other Ambulatory Visit: Payer: Self-pay | Admitting: Obstetrics and Gynecology

## 2013-07-10 ENCOUNTER — Ambulatory Visit (HOSPITAL_COMMUNITY)
Admission: RE | Admit: 2013-07-10 | Discharge: 2013-07-10 | Disposition: A | Discharge status: Home / Self Care | Payer: Medicaid Other | Source: Ambulatory Visit | Attending: Obstetrics and Gynecology | Admitting: Obstetrics and Gynecology

## 2013-07-10 ENCOUNTER — Ambulatory Visit (INDEPENDENT_AMBULATORY_CARE_PROVIDER_SITE_OTHER): Payer: Medicaid Other | Admitting: Obstetrics and Gynecology

## 2013-07-10 ENCOUNTER — Other Ambulatory Visit (HOSPITAL_COMMUNITY)
Admission: RE | Admit: 2013-07-10 | Discharge: 2013-07-10 | Disposition: A | Discharge status: Home / Self Care | Payer: Medicaid Other | Source: Ambulatory Visit | Attending: Obstetrics & Gynecology | Admitting: Obstetrics & Gynecology

## 2013-07-10 VITALS — BP 122/71 | Temp 98.7°F | Wt 220.1 lb

## 2013-07-10 DIAGNOSIS — O0941 Supervision of pregnancy with grand multiparity, first trimester: Secondary | ICD-10-CM

## 2013-07-10 DIAGNOSIS — O3680X0 Pregnancy with inconclusive fetal viability, not applicable or unspecified: Secondary | ICD-10-CM

## 2013-07-10 DIAGNOSIS — IMO0001 Reserved for inherently not codable concepts without codable children: Secondary | ICD-10-CM

## 2013-07-10 DIAGNOSIS — Z01419 Encounter for gynecological examination (general) (routine) without abnormal findings: Secondary | ICD-10-CM | POA: Insufficient documentation

## 2013-07-10 DIAGNOSIS — Z113 Encounter for screening for infections with a predominantly sexual mode of transmission: Secondary | ICD-10-CM | POA: Insufficient documentation

## 2013-07-10 DIAGNOSIS — Z3490 Encounter for supervision of normal pregnancy, unspecified, unspecified trimester: Secondary | ICD-10-CM

## 2013-07-10 DIAGNOSIS — Z331 Pregnant state, incidental: Secondary | ICD-10-CM

## 2013-07-10 DIAGNOSIS — O26849 Uterine size-date discrepancy, unspecified trimester: Secondary | ICD-10-CM | POA: Insufficient documentation

## 2013-07-10 DIAGNOSIS — Z1151 Encounter for screening for human papillomavirus (HPV): Secondary | ICD-10-CM | POA: Insufficient documentation

## 2013-07-10 DIAGNOSIS — O09529 Supervision of elderly multigravida, unspecified trimester: Secondary | ICD-10-CM

## 2013-07-10 DIAGNOSIS — O0940 Supervision of pregnancy with grand multiparity, unspecified trimester: Secondary | ICD-10-CM

## 2013-07-10 DIAGNOSIS — Z3480 Encounter for supervision of other normal pregnancy, unspecified trimester: Secondary | ICD-10-CM

## 2013-07-10 DIAGNOSIS — D649 Anemia, unspecified: Secondary | ICD-10-CM

## 2013-07-10 DIAGNOSIS — O99019 Anemia complicating pregnancy, unspecified trimester: Secondary | ICD-10-CM

## 2013-07-10 DIAGNOSIS — O208 Other hemorrhage in early pregnancy: Secondary | ICD-10-CM | POA: Insufficient documentation

## 2013-07-10 LAB — POCT URINALYSIS DIP (DEVICE)
Bilirubin Urine: NEGATIVE
GLUCOSE, UA: 500 mg/dL — AB
Hgb urine dipstick: NEGATIVE
Ketones, ur: NEGATIVE mg/dL
Leukocytes, UA: NEGATIVE
NITRITE: NEGATIVE
Protein, ur: NEGATIVE mg/dL
Specific Gravity, Urine: >=1.03 (ref 1.005–1.030)
UROBILINOGEN UA: 0.2 mg/dL (ref 0.0–1.0)
pH: 5 (ref 5.0–8.0)

## 2013-07-10 LAB — GLUCOSE TOLERANCE, 1 HOUR (50G) W/O FASTING: Glucose, 1 Hour GTT: 73 mg/dL (ref 70–140)

## 2013-07-10 LAB — HIV ANTIBODY (ROUTINE TESTING W REFLEX): HIV: NONREACTIVE

## 2013-07-10 NOTE — Progress Notes (Signed)
Subjective:    Carrie Douglas is a 39 yo G7P6006 [redacted]w[redacted]d by LMP being seen today for her first obstetrical visit.  Her obstetrical history is significant for advanced maternal age, obesity and smoker. Patient does intend to breast feed. Pregnancy history fully reviewed. Has had gastric bypass.   Patient reports fatigue. Had blood transfusion for sever anemia and syncope in early pregnancy last month. On PNV and iron supplementation. Has quit smoking.   Filed Vitals:   07/10/13 1300  BP: 122/71  Temp: 98.7 F (37.1 C)  Weight: 99.837 kg (220 lb 1.6 oz)    HISTORY: OB History  Gravida Para Term Preterm AB SAB TAB Ectopic Multiple Living  7 6 6  0 0 0 0 0 0 6    # Outcome Date GA Lbr Len/2nd Weight Sex Delivery Anes PTL Lv  7 CUR           6 TRM 12/26/10 [redacted]w[redacted]d 06:40 / 00:01 3.215 kg (7 lb 1.4 oz) M SVD None  Y  5 TRM 2008 [redacted]w[redacted]d 07:00 3.26 kg (7 lb 3 oz) F SVD None  Y  4 TRM 2006 [redacted]w[redacted]d 05:00 3.345 kg (7 lb 6 oz) F SVD None  Y  3 TRM 2002 [redacted]w[redacted]d 10:00 4.196 kg (9 lb 4 oz) F SVD None  Y     Comments: gestational HTN  2 TRM 1999 [redacted]w[redacted]d 15:00 4.451 kg (9 lb 13 oz) M SVD None  Y     Comments: no complications  1 TRM 35/5732 [redacted]w[redacted]d 12:00 3.43 kg (7 lb 9 oz) F SVD None  Y     Comments: no complications     Past Medical History  Diagnosis Date  . Rubella     5 months old  . Varicella     52 1/38 years old  . Anemia     blood transfusion 09/14/10  . Anxiety   . Chronic right-sided headaches   . Depression    Past Surgical History  Procedure Laterality Date  . Gastric bypass  2004  . Laparoscopic tubal ligation  2007   Family History  Problem Relation Age of Onset  . Lung cancer Father      Exam    Uterus:    8-10 wk size  Pelvic Exam:    Perineum: Normal Perineum   Vulva: normal   Vagina:  normal mucosa, normal discharge       Cervix: multiparous appearance   Adnexa: not evaluated   Bony Pelvis: average  System: Breast:  normal appearance, no masses or tenderness   Skin: normal coloration and turgor, no rashes    Neurologic: oriented, normal, grossly non-focal   Extremities: normal strength, tone, and muscle mass   HEENT PERRLA and extra ocular movement intact   Mouth/Teeth mucous membranes moist, pharynx normal without lesions   Neck supple and no masses   Cardiovascular: regular rate and rhythm, no murmurs or gallops   Respiratory:  appears well, vitals normal, no respiratory distress, acyanotic, normal RR, ear and throat exam is normal, neck free of mass or lymphadenopathy, chest clear, no wheezing, crepitations, rhonchi, normal symmetric air entry   Abdomen: soft, non-tender; bowel sounds normal; no masses,  no organomegaly   Urinary: urethral meatus normal  Abdomen pendulous. FHt not heard with Doppler    Assessment:    Pregnancy: Carrie Douglas @ 10wks by LMP and uterine size. Viability undetermined Patient Active Problem List   Diagnosis Date Noted  . Grand multipara in labor in first  trimester 07/10/2013  . Pregnancy 06/01/2013  . Anemia 05/31/2013  . Syncope and collapse 05/31/2013  . Abdominal pain, left lower quadrant 09/06/2012  . Anemia of chronic disease 09/06/2012  . Intussusception of jejunum 09/06/2012  . Viral enteritis 09/06/2012  . Nausea vomiting and diarrhea 09/06/2012  . AMA (advanced maternal age) multigravida 35+ 12/15/2010  . Frequent UTI 12/15/2010  . Anemia, antepartum(648.23) 12/15/2010        Plan:     Initial labs drawn. Prenatal vitamins. Problem list reviewed and updated. Genetic Screening discussed First Screen: requested.  Ultrasound discussed; fetal survey: requested.  Follow up in 3 weeks. 50% of 30 min visit spent on counseling and coordination of care.  Early glucola  Carrie Douglas 07/10/2013  Carrie Douglas could not get FH> sent for viability scan  [place]: embyronic demise 105w3d. Discussed with patient who states she felt ambivalent about being pregnant due to her health problems. Counseled on expectant  management, D&E, cytotec. Prefers D&E because of less expected blood loss. She is considering BTL and will discuss that with husband tonight. Consulted Dr. Harolyn Rutherford who will come discuss options with patient tomorrow at [facility]

## 2013-07-10 NOTE — Progress Notes (Signed)
P= 83 Early 1hr gtt today. OB labs drawn. Pap with cultures today. Flu vaccine today.  Discussed appropriate weight gain (11-20lb); pt. Verbalized understanding.  New OB packet given.

## 2013-07-10 NOTE — Patient Instructions (Signed)
Iron Deficiency Anemia, Adult  Anemia is a condition in which there are less red blood cells or hemoglobin in the blood than normal. Hemoglobin is this part of red blood cells that carries oxygen. Iron deficiency anemia is anemia caused by too little iron. It is the most common type of anemia. It may leave you tired and short of breath.  CAUSES    Lack of iron in the diet.   Poor absorption of iron, as seen with intestinal disorders.   Intestinal bleeding.   Heavy periods.  SIGNS AND SYMPTOMS   Mild anemia may not be noticeable. Symptoms may include:   Fatigue.   Headache.   Pale skin.   Weakness.   Tiredness.   Shortness of breath.   Dizziness.   Cold hands and feet.   Fast or irregular heartbeat.  DIAGNOSIS   Diagnosis requires a thorough evaluation and physical exam by your health care provider. Blood tests are generally used to confirm iron deficiency anemia. Additional tests may be done to find the underlying cause of your anemia. These may include:   Testing for blood in the stool (fecal occult blood test).   A procedure to see inside the colon and rectum (colonoscopy).   A procedure to see inside the esophagus and stomach (endoscopy).  TREATMENT   Iron deficiency anemia is treated by correcting the cause of the deficiency. Treatment may involve:   Adding iron-rich foods to your diet.   Taking iron supplements. Pregnant or breastfeeding women need to take extra iron, because their normal diet usually does not provide the required amount.   Taking vitamins. Vitamin C improves the absorption of iron. Your health care provider may recommend taking your iron tablets with a glass of orange juice or vitamin C supplement.   Medicines to make heavy menstrual flow lighter.   Surgery.  HOME CARE INSTRUCTIONS    Take iron as directed by your health care provider.   If you cannot tolerate taking iron supplements by mouth, talk to your health care provider about taking them through a vein  (intravenously) or an injection into a muscle.   For the best iron absorption, iron supplements should be taken on an empty stomach. If you cannot tolerate them on an empty stomach, you may need to take them with food.   Do not drink milk or take antacids at the same time as your iron supplements. Milk and antacids may interfere with the absorption of iron.   Iron supplements can cause constipation. Make sure to include fiber in your diet to prevent constipation. A stool softener may also be recommended.   Take vitamins as directed by your health care provider.   Eat a diet rich in iron. Foods high in iron include liver, lean beef, whole-grain bread, eggs, dried fruit, and dark green, leafy vegetables.  SEEK IMMEDIATE MEDICAL CARE IF:    You faint. If this happens, do not drive. Call your local emergency services (911 in [place] if no other help is available.   You have chest pain.   You feel nauseous or vomit.   You have severe or increased shortness of breath with activity.   You feel weak.   You have a rapid heartbeat.   You have unexplained sweating.   You become lightheaded when getting up from a chair or bed.  MAKE SURE YOU:    Understand these instructions.   Will watch your condition.   Will get help right away if you are not 

## 2013-07-11 ENCOUNTER — Ambulatory Visit (INDEPENDENT_AMBULATORY_CARE_PROVIDER_SITE_OTHER): Payer: Medicaid Other | Admitting: Obstetrics & Gynecology

## 2013-07-11 ENCOUNTER — Telehealth: Payer: Self-pay | Admitting: *Deleted

## 2013-07-11 ENCOUNTER — Encounter: Payer: Self-pay | Admitting: Advanced Practice Midwife

## 2013-07-11 VITALS — BP 124/80 | HR 81 | Temp 97.5°F

## 2013-07-11 DIAGNOSIS — O021 Missed abortion: Secondary | ICD-10-CM

## 2013-07-11 HISTORY — PX: DILATION AND CURETTAGE OF UTERUS: SHX78

## 2013-07-11 LAB — OBSTETRIC PANEL
Antibody Screen: NEGATIVE
BASOS ABS: 0 10*3/uL (ref 0.0–0.1)
Basophils Relative: 0 % (ref 0–1)
EOS ABS: 0.1 10*3/uL (ref 0.0–0.7)
Eosinophils Relative: 1 % (ref 0–5)
HCT: 32.9 % — ABNORMAL LOW (ref 36.0–46.0)
Hemoglobin: 10 g/dL — ABNORMAL LOW (ref 12.0–15.0)
Hepatitis B Surface Ag: NEGATIVE
Lymphocytes Relative: 29 % (ref 12–46)
Lymphs Abs: 2 10*3/uL (ref 0.7–4.0)
MCH: 21.9 pg — AB (ref 26.0–34.0)
MCHC: 30.4 g/dL (ref 30.0–36.0)
MCV: 72 fL — AB (ref 78.0–100.0)
Monocytes Absolute: 0.5 10*3/uL (ref 0.1–1.0)
Monocytes Relative: 7 % (ref 3–12)
NEUTROS ABS: 4.3 10*3/uL (ref 1.7–7.7)
Neutrophils Relative %: 63 % (ref 43–77)
PLATELETS: 300 10*3/uL (ref 150–400)
RBC: 4.57 MIL/uL (ref 3.87–5.11)
Rh Type: POSITIVE
Rubella: 0.86 Index (ref <0.90)
WBC: 6.8 10*3/uL (ref 4.0–10.5)

## 2013-07-11 LAB — PRESCRIPTION MONITORING PROFILE (19 PANEL)
Amphetamine/Meth: NEGATIVE ng/mL
BUPRENORPHINE, URINE: NEGATIVE ng/mL
Barbiturate Screen, Urine: NEGATIVE ng/mL
Benzodiazepine Screen, Urine: NEGATIVE ng/mL
CARISOPRODOL, URINE: NEGATIVE ng/mL
CREATININE, URINE: 159.43 mg/dL (ref >20.0)
Cannabinoid Scrn, Ur: NEGATIVE ng/mL
Cocaine Metabolites: NEGATIVE ng/mL
ECSTASY: NEGATIVE ng/mL
Fentanyl, Ur: NEGATIVE ng/mL
MEPERIDINE UR: NEGATIVE ng/mL
METHADONE SCREEN, URINE: NEGATIVE ng/mL
METHAQUALONE SCREEN (URINE): NEGATIVE ng/mL
Nitrites, Initial: NEGATIVE ug/mL
OXYCODONE SCRN UR: NEGATIVE ng/mL
Opiate Screen, Urine: NEGATIVE ng/mL
PROPOXYPHENE: NEGATIVE ng/mL
Phencyclidine, Ur: NEGATIVE ng/mL
Tapentadol, urine: NEGATIVE ng/mL
Tramadol Scrn, Ur: NEGATIVE ng/mL
Zolpidem, Urine: NEGATIVE ng/mL
pH, Initial: 6 pH (ref 4.5–8.9)

## 2013-07-11 NOTE — Patient Instructions (Signed)
Return to clinic for any scheduled appointments or for any gynecologic concerns as needed.   

## 2013-07-11 NOTE — Progress Notes (Signed)
   CLINIC ENCOUNTER NOTE  History:  38 y.o. [id number] with recently diagnosed 9 week MAB here to discuss surgical management; she declines misoprostol administration.  Patient is also interested in getting a BTS. Denis any pain, bleeding or other symptoms.  The following portions of the patient's history were reviewed and updated as appropriate: allergies, current medications, past family history, past medical history, past social history, past surgical history and problem list.  Review of Systems:  Pertinent items are noted in HPI.  Objective:  BP 124/80  Pulse 81  Temp(Src) 97.5 F (36.4 C) (Oral)  LMP 05/01/2013 Physical Exam deferred  Labs and Imaging 07/10/2013   OBSTETRIC <14 WK ULTRASOUND LINICAL DATA:  Size date discrepancy. No audible Doppler heart tones. Gestational age by LMP of 10 weeks 0 days.     TECHNIQUE: Transabdominal ultrasound was performed for evaluation of the gestation as well as the maternal uterus and adnexal regions.  COMPARISON:  05/31/13  FINDINGS: Intrauterine gestational sac: Visualized/normal in shape.  Yolk sac:  Visualized  Embryo:  Visualized  Cardiac Activity: Absent  CRL:   26  mm   9 w 3 d             [place] EDC: 02/09/2014  Maternal uterus/adnexae: Small subchorionic hemorrhage noted. Left ovary is normal in appearance. Right ovary not visualized, however there is no evidence of adnexal mass or free fluid.  IMPRESSION: Findings meet definitive criteria for failed pregnancy. This follows SRU consensus guidelines: Diagnostic Criteria for Nonviable Pregnancy Early in the First Trimester. Alison Stalling J Med [id number].   Electronically Signed   By: Earle Gell M.D.   On: 07/10/2013 15:18    Assessment & Plan:  Counseled about D&E, also about BTS.  Given insurance restrictions, she will have to wait for 30 days before BTS is done.  She wants D&E now then BTS later.  D&E scheduled for 07/13/13 with Dr. Roselie Awkward at 10:30 am,  routine preoperative instructions of having  nothing to eat or drink after midnight on the day prior to surgery and also coming to the hospital 1.5 hours prior to her time of surgery were also emphasized.  Bleeding precautions reviewed.  Tubal papers will be signed soon.   Verita Schneiders, MD, [place] Attending [place], [place]

## 2013-07-11 NOTE — Telephone Encounter (Signed)
Patient left without signing tubal consent today. Patient states that she will come by tomorrow and sign them. I advised that she inform front desk that she needs to see a nurse for BTL Consent when she arrives. Patient agreeable to this.

## 2013-07-12 ENCOUNTER — Encounter (HOSPITAL_COMMUNITY): Payer: Self-pay | Admitting: Pharmacist

## 2013-07-12 MED ORDER — DOXYCYCLINE HYCLATE 100 MG IV SOLR
200.0000 mg | INTRAVENOUS | Status: AC
  Administered 2013-07-13: 200 mg via INTRAVENOUS
  Filled 2013-07-12: qty 200

## 2013-07-13 ENCOUNTER — Encounter (HOSPITAL_COMMUNITY): Payer: Self-pay | Admitting: *Deleted

## 2013-07-13 ENCOUNTER — Ambulatory Visit (HOSPITAL_COMMUNITY): Payer: Medicaid Other | Admitting: Anesthesiology

## 2013-07-13 ENCOUNTER — Encounter (HOSPITAL_COMMUNITY): Payer: Medicaid Other | Admitting: Anesthesiology

## 2013-07-13 ENCOUNTER — Encounter (HOSPITAL_COMMUNITY)
Admission: RE | Disposition: A | Discharge status: Home / Self Care | Payer: Self-pay | Source: Ambulatory Visit | Attending: Obstetrics & Gynecology

## 2013-07-13 ENCOUNTER — Ambulatory Visit (HOSPITAL_COMMUNITY)
Admission: RE | Admit: 2013-07-13 | Discharge: 2013-07-13 | Disposition: A | Discharge status: Home / Self Care | Payer: Medicaid Other | Source: Ambulatory Visit | Attending: Obstetrics & Gynecology | Admitting: Obstetrics & Gynecology

## 2013-07-13 DIAGNOSIS — F341 Dysthymic disorder: Secondary | ICD-10-CM | POA: Insufficient documentation

## 2013-07-13 DIAGNOSIS — Z9884 Bariatric surgery status: Secondary | ICD-10-CM | POA: Insufficient documentation

## 2013-07-13 DIAGNOSIS — O021 Missed abortion: Secondary | ICD-10-CM | POA: Insufficient documentation

## 2013-07-13 DIAGNOSIS — D649 Anemia, unspecified: Secondary | ICD-10-CM | POA: Insufficient documentation

## 2013-07-13 DIAGNOSIS — Z9851 Tubal ligation status: Secondary | ICD-10-CM | POA: Insufficient documentation

## 2013-07-13 DIAGNOSIS — O0289 Other abnormal products of conception: Secondary | ICD-10-CM | POA: Insufficient documentation

## 2013-07-13 DIAGNOSIS — Z87891 Personal history of nicotine dependence: Secondary | ICD-10-CM | POA: Insufficient documentation

## 2013-07-13 HISTORY — PX: DILATION AND EVACUATION: SHX1459

## 2013-07-13 LAB — CBC
HEMATOCRIT: 33 % — AB (ref 36.0–46.0)
Hemoglobin: 10.2 g/dL — ABNORMAL LOW (ref 12.0–15.0)
MCH: 22.8 pg — AB (ref 26.0–34.0)
MCHC: 30.9 g/dL (ref 30.0–36.0)
MCV: 73.7 fL — AB (ref 78.0–100.0)
Platelets: 271 10*3/uL (ref 150–400)
RBC: 4.48 MIL/uL (ref 3.87–5.11)
RDW: 24.9 % — ABNORMAL HIGH (ref 11.5–15.5)
WBC: 6.1 10*3/uL (ref 4.0–10.5)

## 2013-07-13 LAB — CULTURE, OB URINE

## 2013-07-13 SURGERY — DILATION AND EVACUATION, UTERUS
Anesthesia: Monitor Anesthesia Care | Site: Uterus

## 2013-07-13 MED ORDER — PROPOFOL 10 MG/ML IV EMUL
INTRAVENOUS | Status: AC
  Filled 2013-07-13: qty 20

## 2013-07-13 MED ORDER — PROPOFOL 10 MG/ML IV EMUL
INTRAVENOUS | Status: AC
  Filled 2013-07-13: qty 40

## 2013-07-13 MED ORDER — LIDOCAINE HCL (CARDIAC) 20 MG/ML IV SOLN
INTRAVENOUS | Status: DC | PRN
  Administered 2013-07-13: 50 mg via INTRAVENOUS

## 2013-07-13 MED ORDER — METOCLOPRAMIDE HCL 5 MG/ML IJ SOLN: 10.0000 mg | Freq: Once | INTRAMUSCULAR | Status: DC | PRN

## 2013-07-13 MED ORDER — BUPIVACAINE-EPINEPHRINE (PF) 0.5% -1:200000 IJ SOLN
INTRAMUSCULAR | Status: AC
  Filled 2013-07-13: qty 10

## 2013-07-13 MED ORDER — ONDANSETRON HCL 4 MG/2ML IJ SOLN
INTRAMUSCULAR | Status: AC
  Filled 2013-07-13: qty 2

## 2013-07-13 MED ORDER — KETOROLAC TROMETHAMINE 30 MG/ML IJ SOLN
INTRAMUSCULAR | Status: AC
  Filled 2013-07-13: qty 1

## 2013-07-13 MED ORDER — MIDAZOLAM HCL 2 MG/2ML IJ SOLN
INTRAMUSCULAR | Status: AC
  Filled 2013-07-13: qty 2

## 2013-07-13 MED ORDER — FENTANYL CITRATE 0.05 MG/ML IJ SOLN
INTRAMUSCULAR | Status: AC
  Filled 2013-07-13: qty 2

## 2013-07-13 MED ORDER — IBUPROFEN 600 MG PO TABS: 600.0000 mg | ORAL_TABLET | Freq: Four times a day (QID) | ORAL | Status: DC | PRN

## 2013-07-13 MED ORDER — BUPIVACAINE-EPINEPHRINE 0.5% -1:200000 IJ SOLN
INTRAMUSCULAR | Status: DC | PRN
  Administered 2013-07-13: 10 mL

## 2013-07-13 MED ORDER — PROPOFOL 10 MG/ML IV EMUL
INTRAVENOUS | Status: DC | PRN
  Administered 2013-07-13 (×9): 50 mg via INTRAVENOUS

## 2013-07-13 MED ORDER — LACTATED RINGERS IV SOLN
INTRAVENOUS | Status: DC
  Administered 2013-07-13: 12:00:00 via INTRAVENOUS

## 2013-07-13 MED ORDER — LIDOCAINE HCL (CARDIAC) 20 MG/ML IV SOLN
INTRAVENOUS | Status: AC
  Filled 2013-07-13: qty 5

## 2013-07-13 MED ORDER — MIDAZOLAM HCL 2 MG/2ML IJ SOLN
INTRAMUSCULAR | Status: DC | PRN
  Administered 2013-07-13: 2 mg via INTRAVENOUS

## 2013-07-13 MED ORDER — LACTATED RINGERS IV SOLN
INTRAVENOUS | Status: DC
  Administered 2013-07-13: 13:00:00 via INTRAVENOUS

## 2013-07-13 MED ORDER — FENTANYL CITRATE 0.05 MG/ML IJ SOLN
INTRAMUSCULAR | Status: DC | PRN
  Administered 2013-07-13: 50 ug via INTRAVENOUS
  Administered 2013-07-13: 100 ug via INTRAVENOUS
  Administered 2013-07-13 (×2): 25 ug via INTRAVENOUS

## 2013-07-13 MED ORDER — ONDANSETRON HCL 4 MG/2ML IJ SOLN
INTRAMUSCULAR | Status: DC | PRN
  Administered 2013-07-13: 4 mg via INTRAVENOUS

## 2013-07-13 MED ORDER — FENTANYL CITRATE 0.05 MG/ML IJ SOLN
25.0000 ug | INTRAMUSCULAR | Status: DC | PRN
Start: 2013-07-13 — End: 2013-07-13

## 2013-07-13 MED ORDER — MEPERIDINE HCL 25 MG/ML IJ SOLN: 6.2500 mg | INTRAMUSCULAR | Status: DC | PRN

## 2013-07-13 MED ORDER — OXYTOCIN 10 UNIT/ML IJ SOLN
INTRAMUSCULAR | Status: DC | PRN
  Administered 2013-07-13: 20 [IU]

## 2013-07-13 MED ORDER — KETOROLAC TROMETHAMINE 30 MG/ML IJ SOLN
INTRAMUSCULAR | Status: DC | PRN
  Administered 2013-07-13: 30 mg via INTRAVENOUS

## 2013-07-13 SURGICAL SUPPLY — 22 items
CATH ROBINSON RED A/P 16FR (CATHETERS) ×3 IMPLANT
CLOTH BEACON ORANGE TIMEOUT ST (SAFETY) ×3 IMPLANT
DECANTER SPIKE VIAL GLASS SM (MISCELLANEOUS) ×3 IMPLANT
DRAPE HYSTEROSCOPY (DRAPE) ×3 IMPLANT
GLOVE BIO SURGEON STRL SZ 6.5 (GLOVE) ×2 IMPLANT
GLOVE BIO SURGEONS STRL SZ 6.5 (GLOVE) ×1
GLOVE BIOGEL PI IND STRL 7.0 (GLOVE) ×1 IMPLANT
GLOVE BIOGEL PI INDICATOR 7.0 (GLOVE) ×2
GOWN STRL REUS W/TWL LRG LVL3 (GOWN DISPOSABLE) ×6 IMPLANT
KIT BERKELEY 1ST TRIMESTER 3/8 (MISCELLANEOUS) IMPLANT
NEEDLE SPNL 22GX3.5 QUINCKE BK (NEEDLE) IMPLANT
NS IRRIG 1000ML POUR BTL (IV SOLUTION) ×3 IMPLANT
PACK VAGINAL MINOR WOMEN LF (CUSTOM PROCEDURE TRAY) ×3 IMPLANT
PAD OB MATERNITY 4.3X12.25 (PERSONAL CARE ITEMS) ×3 IMPLANT
PAD PREP 24X48 CUFFED NSTRL (MISCELLANEOUS) ×3 IMPLANT
SET BERKELEY SUCTION TUBING (SUCTIONS) IMPLANT
SYR CONTROL 10ML LL (SYRINGE) IMPLANT
TOWEL OR 17X24 6PK STRL BLUE (TOWEL DISPOSABLE) ×6 IMPLANT
VACURETTE 10 RIGID CVD (CANNULA) IMPLANT
VACURETTE 7MM CVD STRL WRAP (CANNULA) IMPLANT
VACURETTE 8 RIGID CVD (CANNULA) IMPLANT
VACURETTE 9 RIGID CVD (CANNULA) ×3 IMPLANT

## 2013-07-13 NOTE — Addendum Note (Signed)
Addendum created 07/13/13 1933 by Jonna Munro, CRNA   Modules edited: Anesthesia Responsible Staff

## 2013-07-13 NOTE — Anesthesia Postprocedure Evaluation (Signed)
  Anesthesia Post-op Note  Patient: Carrie Douglas  Procedure(s) Performed: Procedure(s): DILATATION AND EVACUATION (N/A)  Patient Location: PACU  Anesthesia Type:MAC  Level of Consciousness: awake, alert  and oriented  Airway and Oxygen Therapy: Patient Spontanous Breathing  Post-op Pain: none  Post-op Assessment: Post-op Vital signs reviewed, Patient's Cardiovascular Status Stable, Respiratory Function Stable, Patent Airway, No signs of Nausea or vomiting, Adequate PO intake and Pain level controlled  Post-op Vital Signs: Reviewed and stable  Complications: No apparent anesthesia complications

## 2013-07-13 NOTE — Discharge Instructions (Signed)

## 2013-07-13 NOTE — Transfer of Care (Signed)
Immediate Anesthesia Transfer of Care Note  Patient: Carrie Douglas  Procedure(s) Performed: Procedure(s): DILATATION AND EVACUATION (N/A)  Patient Location: PACU  Anesthesia Type:MAC  Level of Consciousness: awake, alert  and oriented  Airway & Oxygen Therapy: Patient Spontanous Breathing  Post-op Assessment: Report given to PACU RN and Post -op Vital signs reviewed and stable  Post vital signs: Reviewed and stable  Complications: No apparent anesthesia complications

## 2013-07-13 NOTE — H&P (Signed)
Subjective:       Carrie Douglas is a 38 yo G7P6006 [redacted]w[redacted]d by LMP was seen 07/11/13 for her first obstetrical visit.  Her obstetrical history is significant for advanced maternal age, obesity and smoker. Patient does intend to breast feed. Pregnancy history fully reviewed. Has had gastric bypass.  Fetal demise was diagnosed and she requested surgical management.  Patient reports fatigue. Had blood transfusion for sever anemia and syncope in early pregnancy last month. On PNV and iron supplementation. Has quit smoking.     Filed Vitals:     07/10/13 1300   BP:  122/71   Temp:  98.7 F (37.1 C)   Weight:  99.837 kg (220 lb 1.6 oz)        HISTORY: OB History   Gravida  Para  Term  Preterm  AB  SAB  TAB  Ectopic  Multiple  Living   7  6  6   0  0  0  0  0  0  6      #  Outcome  Date  GA  Lbr Len/2nd  Weight  Sex  Delivery  Anes  PTL  Lv   7  CUR                     6  TRM  12/26/10  [redacted]w[redacted]d  06:40 / 00:01  3.215 kg (7 lb 1.4 oz)  M  SVD  None    Y   5  TRM  2008  [redacted]w[redacted]d  07:00  3.26 kg (7 lb 3 oz)  F  SVD  None    Y   4  TRM  2006  [redacted]w[redacted]d  05:00  3.345 kg (7 lb 6 oz)  F  SVD  None    Y   3  TRM  2002  [redacted]w[redacted]d  10:00  4.196 kg (9 lb 4 oz)  F  SVD  None    Y      Comments: gestational HTN   2  TRM  1999  [redacted]w[redacted]d  15:00  4.451 kg (9 lb 13 oz)  M  SVD  None    Y      Comments: no complications   1  TRM  28/4132  [redacted]w[redacted]d  12:00  3.43 kg (7 lb 9 oz)  F  SVD  None    Y      Comments: no complications          Past Medical History   Diagnosis  Date   .  Rubella         5 months old   .  Varicella         45 1/39 years old   .  Anemia         blood transfusion 09/14/10   .  Anxiety     .  Chronic right-sided headaches     .  Depression         Past Surgical History   Procedure  Laterality  Date   .  Gastric bypass    2004   .  Laparoscopic tubal ligation    2007       Family History   Problem  Relation  Age of Onset   .  Lung cancer  Father             Exam         Uterus:      8-10 wk size  Pelvic Exam:       Perineum:  Normal Perineum     Vulva:  normal     Vagina:   normal mucosa, normal discharge            Cervix:  multiparous appearance     Adnexa:  not evaluated     Bony Pelvis:  average   System:  Breast:   normal appearance, no masses or tenderness     Skin:  normal coloration and turgor, no rashes      Neurologic:  oriented, normal, grossly non-focal     Extremities:  normal strength, tone, and muscle mass     HEENT  PERRLA and extra ocular movement intact     Mouth/Teeth  mucous membranes moist, pharynx normal without lesions     Neck  supple and no masses     Cardiovascular:  regular rate and rhythm, no murmurs or gallops     Respiratory:   appears well, vitals normal, no respiratory distress, acyanotic, normal RR, ear and throat exam is normal, neck free of mass or lymphadenopathy, chest clear, no wheezing, crepitations, rhonchi, normal symmetric air entry     Abdomen:  soft, non-tender; bowel sounds normal; no masses,  no organomegaly     Urinary:  urethral meatus normal    Abdomen pendulous. FHt not heard with Doppler      Assessment:       Pregnancy: [id number] @ 10wks by LMP and uterine size. Viability undetermined Patient Active Problem List     Diagnosis  Date Noted   .  [place] multipara in labor in first trimester  07/10/2013   .  Pregnancy  06/01/2013   .  Anemia  05/31/2013   .  Syncope and collapse  05/31/2013   .  Abdominal pain, left lower quadrant  09/06/2012   .  Anemia of chronic disease  09/06/2012   .  Intussusception of jejunum  09/06/2012   .  Viral enteritis  09/06/2012   .  Nausea vomiting and diarrhea  09/06/2012   .  AMA (advanced maternal age) multigravida 35+  12/15/2010   .  Frequent UTI  12/15/2010   .  Anemia, antepartum(648.23)  12/15/2010             Plan:    [place]: embyronic demise [redacted]w[redacted]d. Discussed with patient who states she felt ambivalent about being pregnant due to her health problems.  Counseled on expectant management, D&E, cytotec. Prefers D&E because of less expected blood loss.  Counseled about D&E, also about BTS. Given insurance restrictions, she will have to wait for 30 days before BTS is done. She wants D&E now then BTS later. D&E scheduled for 07/13/13 with Dr. Roselie Awkward at 10:30 am, routine preoperative instructions of having nothing to eat or drink after midnight on the day prior to surgery and also coming to the hospital 1.5 hours prior to her time of surgery were also emphasized.  Procedure and he risk of bleeding, infection, pelvic organ damage discussed and questions were answered.        Woodroe Mode, MD 07/13/2013

## 2013-07-13 NOTE — Addendum Note (Signed)
Addendum created 07/13/13 1803 by Jonna Munro, CRNA   Modules edited: Charges VN

## 2013-07-13 NOTE — Anesthesia Preprocedure Evaluation (Signed)
Anesthesia Evaluation  Patient identified by MRN, date of birth, ID band Patient awake    Reviewed: Allergy & Precautions, H&P , NPO status , Patient's Chart, lab work & pertinent test results  Airway Mallampati: III TM Distance: >3 FB Neck ROM: Full    Dental no notable dental hx. (+) Teeth Intact, Caps   Pulmonary former smoker,  breath sounds clear to auscultation  Pulmonary exam normal       Cardiovascular negative cardio ROS  Rhythm:Regular Rate:Normal     Neuro/Psych  Headaches, PSYCHIATRIC DISORDERS Anxiety Depression    GI/Hepatic negative GI ROS, Neg liver ROS, Hx/o Gastric Bypass   Endo/Other  Obesity  Renal/GU negative Renal ROS  negative genitourinary   Musculoskeletal negative musculoskeletal ROS (+)   Abdominal (+) + obese,   Peds  Hematology  (+) anemia ,   Anesthesia Other Findings   Reproductive/Obstetrics Missed Ab [place] Multiparity AMA                           Anesthesia Physical Anesthesia Plan  ASA: II  Anesthesia Plan: MAC   Post-op Pain Management:    Induction: Intravenous  Airway Management Planned: Natural Airway  Additional Equipment:   Intra-op Plan:   Post-operative Plan:   Informed Consent: I have reviewed the patients History and Physical, chart, labs and discussed the procedure including the risks, benefits and alternatives for the proposed anesthesia with the patient or authorized representative who has indicated his/her understanding and acceptance.   Dental advisory given  Plan Discussed with: Anesthesiologist, CRNA and Surgeon  Anesthesia Plan Comments:         Anesthesia Quick Evaluation

## 2013-07-13 NOTE — Addendum Note (Signed)
Addendum created 07/13/13 1859 by Jonna Munro, CRNA   Modules edited: Anesthesia Responsible Staff

## 2013-07-13 NOTE — Op Note (Signed)
Carrie Douglas PROCEDURE DATE: 07/13/2013  PREOPERATIVE DIAGNOSES: blighted ovum POSTOPERATIVE DIAGNOSES: The same  PROCEDURE:  D&C SURGEON:  Dr. Roselie Awkward  ASSISTANT:  Dr. Leslie Andrea  INDICATIONS: Carrie Douglas is a 38 y.o. G7P6006 at [redacted]w[redacted]d here for [facility] after a missed abortion. Pt elected for surgical management for miscarriage  FINDINGS:  ANESTHESIA: MAC Anesthesia INTRAVENOUS FLUIDS: 800 ml ESTIMATED BLOOD LOSS: ~20 ml URINE OUTPUT:  Not measured SPECIMENS: sent to pathology COMPLICATIONS: None immediate  PROCEDURE IN DETAIL: Carrie Douglas is a 38 y.o. the OR. Patient was given doxycycline prior to initiation of surgery. Patient was prepped in a sterile manner with vaginal prep. A surgical speculum was placed and good visualization of cervix was obtained. Paracervical block placed with 5 cc of Marcaine at 3 and 9. 2 cc placed at 12:00 cervical. A tenaculum was placed at 12:00 for stability of cervix. Patient was then dilated to a 22 French cervical dilator. Vacuum curettage was then administered with a 9 mm suction curette. Copious amounts of products of conception and serosanguineous fluid was collected. After 2 passes no additional fluid or tissue was collected. Tenaculum was removed and bleeding was noted at the left tooth site. Silver nitrate applied to site with hemostasis. Counts correct. EBL approximately 20 mL. Minimal bleeding post procedure.  Carrie Rigger, MD OB Fellow

## 2013-07-16 ENCOUNTER — Encounter (HOSPITAL_COMMUNITY): Payer: Self-pay | Admitting: Obstetrics & Gynecology

## 2013-07-25 ENCOUNTER — Encounter: Payer: Self-pay | Admitting: *Deleted

## 2013-07-31 ENCOUNTER — Other Ambulatory Visit (HOSPITAL_COMMUNITY): Payer: Medicaid Other

## 2013-07-31 ENCOUNTER — Ambulatory Visit (HOSPITAL_COMMUNITY): Payer: Medicaid Other

## 2013-08-01 ENCOUNTER — Encounter: Payer: Self-pay | Admitting: *Deleted

## 2013-08-06 ENCOUNTER — Ambulatory Visit: Payer: Medicaid Other | Admitting: Obstetrics & Gynecology

## 2013-08-08 ENCOUNTER — Telehealth: Payer: Self-pay | Admitting: Internal Medicine

## 2013-08-08 NOTE — Telephone Encounter (Signed)
C/D 08/08/13 for appt. 08/20/13

## 2013-08-08 NOTE — Telephone Encounter (Signed)
S/W PATIENT AND GAVE NEW PATIENT APPT FOR 05/11 @ 10:30 W/DR. CHISM.  [place] PACKET MAILED.

## 2013-08-09 ENCOUNTER — Encounter: Payer: Medicaid Other | Admitting: Family Medicine

## 2013-08-10 HISTORY — PX: SALPINGOOPHORECTOMY: SHX82

## 2013-08-13 ENCOUNTER — Telehealth: Payer: Self-pay | Admitting: *Deleted

## 2013-08-13 NOTE — Telephone Encounter (Signed)
Pt left message stating that she wants to set up appt to have her tubal surgery. Please call back.

## 2013-08-14 NOTE — Telephone Encounter (Signed)
Called patient, no answer- left message that we are trying to return your phone call, please call us back at the clinics 

## 2013-08-15 NOTE — Telephone Encounter (Signed)
Spoke with patient advised that she make appointment for dr to followup from d&c and schedule tubal. Patient agreeable.

## 2013-08-20 ENCOUNTER — Other Ambulatory Visit: Payer: Self-pay | Admitting: Internal Medicine

## 2013-08-20 ENCOUNTER — Ambulatory Visit: Payer: Medicaid Other

## 2013-08-20 ENCOUNTER — Other Ambulatory Visit: Payer: Medicaid Other

## 2013-08-20 DIAGNOSIS — D649 Anemia, unspecified: Secondary | ICD-10-CM

## 2013-09-27 ENCOUNTER — Encounter: Payer: Self-pay | Admitting: Obstetrics & Gynecology

## 2013-09-27 ENCOUNTER — Ambulatory Visit (INDEPENDENT_AMBULATORY_CARE_PROVIDER_SITE_OTHER): Payer: Medicaid Other | Admitting: Obstetrics & Gynecology

## 2013-09-27 VITALS — BP 130/81 | HR 87 | Ht 66.0 in | Wt 221.6 lb

## 2013-09-27 DIAGNOSIS — Z302 Encounter for sterilization: Secondary | ICD-10-CM | POA: Diagnosis not present

## 2013-09-27 NOTE — Progress Notes (Signed)
   CLINIC ENCOUNTER NOTE  History:  38 y.o. [id number] here today for sterilization consultation. Had LTL in 2007, but had three pregnancies since then (2008, 2012 and 2015).  First two resulted in live births, last one was a MAB requiring D&C.  Patient desires salpingectomy.  The following portions of the patient's history were reviewed and updated as appropriate: allergies, current medications, past family history, past medical history, past social history, past surgical history and problem list.  Normal pap and negative HPV on 3/06/10/13.  Review of Systems:  Pertinent items are noted in HPI.  Objective:  Physical Exam BP 130/81  Pulse 87  Ht 5\' 6"  (1.676 m)  Wt 221 lb 9.6 oz (100.517 kg)  BMI 35.78 kg/m2  LMP 09/10/2013  Breastfeeding? No Gen: NAD Abd: Soft, nontender and nondistended. Well-healed previous laparoscopy scars. Pelvic: Deferred  Assessment & Plan:  Patient desires permanent sterilization in the form of laparoscopic bilateral salpingectomy. Other reversible forms of contraception were discussed with patient; she declines all other modalities. Risks of procedure discussed with patient including but not limited to: risk of regret, permanence of method, bleeding, infection, injury to surrounding organs and need for additional procedures.  Failure risk of <1 % with increased risk of ectopic gestation if pregnancy occurs was also discussed with patient.  Patient verbalized understanding of these risks and wants to proceed with sterilization.  All questions were answered.  She was told that she will be contacted by our surgical scheduler regarding the time and date of her surgery; routine preoperative instructions of having nothing to eat or drink after midnight on the day prior to surgery and also coming to the hospital 1.5 hours prior to her time of surgery were also emphasized.  She was told she may be called for a preoperative appointment about a week prior to surgery and will be  given further preoperative instructions at that visit. Printed patient education handouts about the procedure were given to the patient to review at home.  Of note, Medicaid papers were signed 07/12/13.  Verita Schneiders, MD, [place] Attending [place], [facility]

## 2013-09-27 NOTE — Patient Instructions (Signed)
Laparoscopic BilateralTubal Removal/Laparoscopic Bilateral Salpingectomy Laparoscopic tubal removal is a procedure that removes the fallopian tubes at a time other than right after childbirth. By removing the fallopian tubes, the eggs that are released from the ovaries cannot enter the uterus and sperm cannot reach the egg. This is more effective than tubal ligation which is also known as getting your "tubes tied." Tubal removal is done so you will not be able to get pregnant or have a baby.  This procedure is permanent and irreversible. If you want to have future pregnancies, you should not have this procedure.  LET YOUR CAREGIVER KNOW ABOUT: Allergies to food or medicine. Medicines taken, including vitamins, herbs, eyedrops, over-the-counter medicines, and creams. Use of steroids (by mouth or creams). Previous problems with numbing medicines. History of bleeding problems or blood clots. Any recent colds or infections. Previous surgery. Other health problems, including diabetes and kidney problems. Possibility of pregnancy, if this applies. Any past pregnancies. RISKS AND COMPLICATIONS  Infection. Bleeding. Injury to surrounding organs. Anesthetic side effects. Failure of the procedure. Ectopic pregnancy. Future regret about having the procedure done. BEFORE THE PROCEDURE Do not take aspirin or blood thinners a week before the procedure or as directed. This can cause bleeding. Do not eat or drink anything 6 to 8 hours before the procedure. PROCEDURE  You may be given a medicine to help you relax (sedative) before the procedure. You will be given a medicine to make you sleep (general anesthetic) during the procedure. A tube will be put down your throat to help your breath while under general anesthesia. Two small cuts (incisions) are made in the lower abdominal area and one incision is made near the belly button. Your abdominal area will be inflated with a safe gas (carbon dioxide). This  helps give the surgeon room to operate, visualize, and helps the surgeon avoid other organs. A thin, lighted tube (laparoscope) with a camera attached is inserted into your abdomen through the incision near the belly button. Other small instruments are also inserted through the other abdominal incisions. The fallopian tubes are located and are removed. After the fallopian tubes are removed, the gas is released from the abdomen. The incisions will be closed with stitches (sutures), and Dermabond. A bandage may be placed over the incisions. AFTER THE PROCEDURE  You will also have some mild abdominal discomfort for 3-7 days. You will be given pain medicine to ease any discomfort. As long as there are no problems, you may be allowed to go home. Someone will need to drive you home and be with you for at least 24 hours once home. You may have some mild discomfort in the throat. This is from the tube placed in your throat while you were sleeping. You may experience discomfort in the shoulder area from some trapped air between the liver and diaphragm. This sensation is normal and will slowly go away on its own. HOME CARE INSTRUCTIONS  Take all medicines as directed. Only take over-the-counter or prescription medicines for pain, discomfort, or fever as directed by your caregiver. Resume daily activities as directed. Showers are preferred over baths. You may resume sexual activities in 1 week or as directed. Do not drive while taking narcotics. SEEK MEDICAL CARE IF: . There is increasing abdominal pain. You feel lightheaded or faint. You have the chills. You have an oral temperature above 102 F (38.9 C). There is pus-like (purulent) drainage from any of the wounds. You are unable to pass gas or have a   bowel movement. You feel sick to your stomach (nauseous) or throw up (vomit). MAKE SURE YOU:  Understand these instructions. Will watch your condition. Will get help right away if you are not doing  well or get worse.  [employer] Patient Information 2013 [employer].    

## 2013-10-03 ENCOUNTER — Encounter (HOSPITAL_COMMUNITY): Payer: Self-pay | Admitting: Pharmacist

## 2013-10-05 ENCOUNTER — Encounter (HOSPITAL_COMMUNITY)
Admission: RE | Admit: 2013-10-05 | Discharge: 2013-10-05 | Disposition: A | Discharge status: Home / Self Care | Payer: Medicaid Other | Source: Ambulatory Visit | Attending: Obstetrics & Gynecology | Admitting: Obstetrics & Gynecology

## 2013-10-05 ENCOUNTER — Encounter (HOSPITAL_COMMUNITY): Payer: Self-pay

## 2013-10-05 DIAGNOSIS — Z01812 Encounter for preprocedural laboratory examination: Secondary | ICD-10-CM | POA: Insufficient documentation

## 2013-10-05 LAB — CBC
HEMATOCRIT: 31.4 % — AB (ref 36.0–46.0)
Hemoglobin: 9.4 g/dL — ABNORMAL LOW (ref 12.0–15.0)
MCH: 21.7 pg — AB (ref 26.0–34.0)
MCHC: 29.9 g/dL — ABNORMAL LOW (ref 30.0–36.0)
MCV: 72.5 fL — ABNORMAL LOW (ref 78.0–100.0)
PLATELETS: 249 10*3/uL (ref 150–400)
RBC: 4.33 MIL/uL (ref 3.87–5.11)
RDW: 15.2 % (ref 11.5–15.5)
WBC: 7 10*3/uL (ref 4.0–10.5)

## 2013-10-05 NOTE — Patient Instructions (Signed)
[place]  10/05/2013   Your procedure is scheduled on:  10/10/13  Enter through the Main Entrance of [facility] [place] up the phone at the desk and dial [phone].   Call this number if you have problems the morning of surgery: [phone]   Remember:   Do not eat food:After Midnight.  Do not drink clear liquids: After Midnight.  Take these medicines the morning of surgery with A SIP OF WATER: NA   Do not wear jewelry, make-up or nail polish.  Do not wear lotions, powders, or perfumes. You may wear deodorant.  Do not shave 48 hours prior to surgery.  Do not bring valuables to the hospital.  [facility] is not   responsible for any belongings or valuables brought to the hospital.  Contacts, dentures or bridgework may not be worn into surgery.  Leave suitcase in the car. After surgery it may be brought to your room.  For patients admitted to the hospital, checkout time is 11:00 AM the day of              discharge.   Patients discharged the day of surgery will not be allowed to drive             home.  Name and phone number of your driver: undecided  Special Instructions:      Please read over the following fact sheets that you were given:   Surgical Site Infection Prevention

## 2013-10-10 ENCOUNTER — Ambulatory Visit (HOSPITAL_COMMUNITY)
Admission: RE | Admit: 2013-10-10 | Discharge: 2013-10-10 | Disposition: A | Discharge status: Home / Self Care | Payer: Medicaid Other | Source: Ambulatory Visit | Attending: Obstetrics & Gynecology | Admitting: Obstetrics & Gynecology

## 2013-10-10 ENCOUNTER — Encounter (HOSPITAL_COMMUNITY): Payer: Self-pay | Admitting: Anesthesiology

## 2013-10-10 ENCOUNTER — Encounter (HOSPITAL_COMMUNITY)
Admission: RE | Disposition: A | Discharge status: Home / Self Care | Payer: Self-pay | Source: Ambulatory Visit | Attending: Obstetrics & Gynecology

## 2013-10-10 ENCOUNTER — Ambulatory Visit (HOSPITAL_COMMUNITY): Payer: Medicaid Other | Admitting: Anesthesiology

## 2013-10-10 ENCOUNTER — Encounter (HOSPITAL_COMMUNITY): Payer: Medicaid Other | Admitting: Anesthesiology

## 2013-10-10 DIAGNOSIS — Z87891 Personal history of nicotine dependence: Secondary | ICD-10-CM | POA: Diagnosis not present

## 2013-10-10 DIAGNOSIS — Z302 Encounter for sterilization: Secondary | ICD-10-CM | POA: Diagnosis not present

## 2013-10-10 HISTORY — PX: LAPAROSCOPIC BILATERAL SALPINGECTOMY: SHX5889

## 2013-10-10 LAB — CBC
HEMATOCRIT: 32.6 % — AB (ref 36.0–46.0)
HEMOGLOBIN: 9.6 g/dL — AB (ref 12.0–15.0)
MCH: 21.6 pg — ABNORMAL LOW (ref 26.0–34.0)
MCHC: 29.4 g/dL — AB (ref 30.0–36.0)
MCV: 73.3 fL — ABNORMAL LOW (ref 78.0–100.0)
Platelets: 280 10*3/uL (ref 150–400)
RBC: 4.45 MIL/uL (ref 3.87–5.11)
RDW: 15.7 % — AB (ref 11.5–15.5)
WBC: 6.9 10*3/uL (ref 4.0–10.5)

## 2013-10-10 LAB — TYPE AND SCREEN
ABO/RH(D): A POS
Antibody Screen: NEGATIVE

## 2013-10-10 LAB — PREGNANCY, URINE: Preg Test, Ur: NEGATIVE

## 2013-10-10 LAB — ABO/RH: ABO/RH(D): A POS

## 2013-10-10 SURGERY — SALPINGECTOMY, BILATERAL, LAPAROSCOPIC
Anesthesia: General | Site: Abdomen | Laterality: Bilateral

## 2013-10-10 MED ORDER — MEPERIDINE HCL 25 MG/ML IJ SOLN: 6.2500 mg | INTRAMUSCULAR | Status: DC | PRN

## 2013-10-10 MED ORDER — GLYCOPYRROLATE 0.2 MG/ML IJ SOLN
INTRAMUSCULAR | Status: AC
  Filled 2013-10-10: qty 2

## 2013-10-10 MED ORDER — FENTANYL CITRATE 0.05 MG/ML IJ SOLN
INTRAMUSCULAR | Status: AC
  Filled 2013-10-10: qty 2

## 2013-10-10 MED ORDER — LIDOCAINE HCL (CARDIAC) 20 MG/ML IV SOLN
INTRAVENOUS | Status: DC | PRN
  Administered 2013-10-10: 50 mg via INTRAVENOUS

## 2013-10-10 MED ORDER — FENTANYL CITRATE 0.05 MG/ML IJ SOLN
25.0000 ug | INTRAMUSCULAR | Status: DC | PRN
  Administered 2013-10-10: 25 ug via INTRAVENOUS
  Administered 2013-10-10 (×2): 50 ug via INTRAVENOUS

## 2013-10-10 MED ORDER — ACETAMINOPHEN 500 MG PO TABS
ORAL_TABLET | ORAL | Status: AC
  Administered 2013-10-10: 1000 mg via ORAL
  Filled 2013-10-10: qty 2

## 2013-10-10 MED ORDER — ROCURONIUM BROMIDE 100 MG/10ML IV SOLN
INTRAVENOUS | Status: AC
  Filled 2013-10-10: qty 1

## 2013-10-10 MED ORDER — PROPOFOL 10 MG/ML IV EMUL
INTRAVENOUS | Status: AC
  Filled 2013-10-10: qty 20

## 2013-10-10 MED ORDER — KETOROLAC TROMETHAMINE 30 MG/ML IJ SOLN: 15.0000 mg | Freq: Once | INTRAMUSCULAR | Status: DC | PRN

## 2013-10-10 MED ORDER — LIDOCAINE HCL (CARDIAC) 20 MG/ML IV SOLN
INTRAVENOUS | Status: AC
  Filled 2013-10-10: qty 5

## 2013-10-10 MED ORDER — FENTANYL CITRATE 0.05 MG/ML IJ SOLN
INTRAMUSCULAR | Status: AC
  Administered 2013-10-10: 50 ug via INTRAVENOUS
  Filled 2013-10-10: qty 2

## 2013-10-10 MED ORDER — OXYCODONE-ACETAMINOPHEN 5-325 MG PO TABS: 1.0000 | ORAL_TABLET | Freq: Four times a day (QID) | ORAL | Status: DC | PRN

## 2013-10-10 MED ORDER — DEXAMETHASONE SODIUM PHOSPHATE 10 MG/ML IJ SOLN
INTRAMUSCULAR | Status: DC | PRN
  Administered 2013-10-10: 6 mg via INTRAVENOUS

## 2013-10-10 MED ORDER — ACETAMINOPHEN 500 MG PO TABS
1000.0000 mg | ORAL_TABLET | Freq: Once | ORAL | Status: AC
  Administered 2013-10-10: 1000 mg via ORAL

## 2013-10-10 MED ORDER — BUPIVACAINE HCL (PF) 0.5 % IJ SOLN
INTRAMUSCULAR | Status: DC | PRN
  Administered 2013-10-10: 30 mL

## 2013-10-10 MED ORDER — FENTANYL CITRATE 0.05 MG/ML IJ SOLN
INTRAMUSCULAR | Status: DC | PRN
  Administered 2013-10-10: 100 ug via INTRAVENOUS
  Administered 2013-10-10: 50 ug via INTRAVENOUS
  Administered 2013-10-10: 100 ug via INTRAVENOUS
  Administered 2013-10-10 (×2): 50 ug via INTRAVENOUS

## 2013-10-10 MED ORDER — ONDANSETRON HCL 4 MG/2ML IJ SOLN: 4.0000 mg | Freq: Once | INTRAMUSCULAR | Status: DC | PRN

## 2013-10-10 MED ORDER — NEOSTIGMINE METHYLSULFATE 10 MG/10ML IV SOLN
INTRAVENOUS | Status: DC | PRN
  Administered 2013-10-10: 2 mg via INTRAVENOUS

## 2013-10-10 MED ORDER — PROPOFOL 10 MG/ML IV BOLUS
INTRAVENOUS | Status: DC | PRN
  Administered 2013-10-10: 50 mg via INTRAVENOUS
  Administered 2013-10-10: 150 mg via INTRAVENOUS

## 2013-10-10 MED ORDER — NEOSTIGMINE METHYLSULFATE 10 MG/10ML IV SOLN
INTRAVENOUS | Status: AC
  Filled 2013-10-10: qty 1

## 2013-10-10 MED ORDER — PROMETHAZINE HCL 25 MG PO TABS: 25.0000 mg | ORAL_TABLET | Freq: Four times a day (QID) | ORAL | Status: DC | PRN

## 2013-10-10 MED ORDER — DOCUSATE SODIUM 100 MG PO CAPS: 100.0000 mg | ORAL_CAPSULE | Freq: Two times a day (BID) | ORAL | Status: DC | PRN

## 2013-10-10 MED ORDER — ONDANSETRON HCL 4 MG/2ML IJ SOLN
INTRAMUSCULAR | Status: AC
  Filled 2013-10-10: qty 2

## 2013-10-10 MED ORDER — LACTATED RINGERS IV SOLN: INTRAVENOUS | Status: DC

## 2013-10-10 MED ORDER — IBUPROFEN 600 MG PO TABS: 600.0000 mg | ORAL_TABLET | Freq: Four times a day (QID) | ORAL | Status: DC | PRN

## 2013-10-10 MED ORDER — ROCURONIUM BROMIDE 100 MG/10ML IV SOLN
INTRAVENOUS | Status: DC | PRN
  Administered 2013-10-10: 10 mg via INTRAVENOUS
  Administered 2013-10-10: 40 mg via INTRAVENOUS

## 2013-10-10 MED ORDER — FENTANYL CITRATE 0.05 MG/ML IJ SOLN
INTRAMUSCULAR | Status: AC
  Filled 2013-10-10: qty 5

## 2013-10-10 MED ORDER — MIDAZOLAM HCL 2 MG/2ML IJ SOLN
INTRAMUSCULAR | Status: DC | PRN
  Administered 2013-10-10: 2 mg via INTRAVENOUS

## 2013-10-10 MED ORDER — ONDANSETRON HCL 4 MG/2ML IJ SOLN
INTRAMUSCULAR | Status: DC | PRN
  Administered 2013-10-10: 4 mg via INTRAVENOUS

## 2013-10-10 MED ORDER — LACTATED RINGERS IV SOLN
INTRAVENOUS | Status: DC
  Administered 2013-10-10 (×3): via INTRAVENOUS

## 2013-10-10 MED ORDER — BUPIVACAINE HCL (PF) 0.5 % IJ SOLN
INTRAMUSCULAR | Status: AC
  Filled 2013-10-10: qty 30

## 2013-10-10 MED ORDER — MIDAZOLAM HCL 2 MG/2ML IJ SOLN
INTRAMUSCULAR | Status: AC
  Filled 2013-10-10: qty 2

## 2013-10-10 MED ORDER — GLYCOPYRROLATE 0.2 MG/ML IJ SOLN
INTRAMUSCULAR | Status: DC | PRN
  Administered 2013-10-10: 0.4 mg via INTRAVENOUS

## 2013-10-10 SURGICAL SUPPLY — 27 items
BENZOIN TINCTURE PRP APPL 2/3 (GAUZE/BANDAGES/DRESSINGS) ×3 IMPLANT
CATH ROBINSON RED A/P 16FR (CATHETERS) ×3 IMPLANT
CHLORAPREP W/TINT 26ML (MISCELLANEOUS) ×3 IMPLANT
CLOSURE WOUND 1/2 X4 (GAUZE/BANDAGES/DRESSINGS) ×1
CLOTH BEACON ORANGE TIMEOUT ST (SAFETY) ×3 IMPLANT
DECANTER SPIKE VIAL GLASS SM (MISCELLANEOUS) ×3 IMPLANT
DRSG OPSITE POSTOP 3X4 (GAUZE/BANDAGES/DRESSINGS) ×3 IMPLANT
ELECT REM PT RETURN 9FT ADLT (ELECTROSURGICAL) ×3
ELECTRODE REM PT RTRN 9FT ADLT (ELECTROSURGICAL) ×1 IMPLANT
FILTER SMOKE EVAC LAPAROSHD (FILTER) ×3 IMPLANT
GLOVE BIOGEL PI IND STRL 7.0 (GLOVE) ×2 IMPLANT
GLOVE BIOGEL PI INDICATOR 7.0 (GLOVE) ×4
GLOVE ECLIPSE 7.0 STRL STRAW (GLOVE) ×3 IMPLANT
GLOVE SURG SS PI 7.0 STRL IVOR (GLOVE) ×15 IMPLANT
GOWN STRL REUS W/TWL LRG LVL3 (GOWN DISPOSABLE) ×6 IMPLANT
NS IRRIG 1000ML POUR BTL (IV SOLUTION) ×3 IMPLANT
PACK LAPAROSCOPY BASIN (CUSTOM PROCEDURE TRAY) ×3 IMPLANT
PAD OB MATERNITY 4.3X12.25 (PERSONAL CARE ITEMS) ×3 IMPLANT
PROTECTOR NERVE ULNAR (MISCELLANEOUS) ×6 IMPLANT
SHEARS HARMONIC ACE PLUS 36CM (ENDOMECHANICALS) ×3 IMPLANT
STRIP CLOSURE SKIN 1/2X4 (GAUZE/BANDAGES/DRESSINGS) ×2 IMPLANT
SUT VICRYL 0 UR6 27IN ABS (SUTURE) ×6 IMPLANT
SUT VICRYL 4-0 PS2 18IN ABS (SUTURE) ×3 IMPLANT
SYRINGE 60CC LL (MISCELLANEOUS) ×3 IMPLANT
TOWEL OR 17X24 6PK STRL BLUE (TOWEL DISPOSABLE) ×6 IMPLANT
WARMER LAPAROSCOPE (MISCELLANEOUS) ×3 IMPLANT
WATER STERILE IRR 1000ML POUR (IV SOLUTION) ×3 IMPLANT

## 2013-10-10 NOTE — Discharge Instructions (Signed)

## 2013-10-10 NOTE — Op Note (Signed)
Carrie Douglas PROCEDURE DATE: 10/10/2013   PREOPERATIVE DIAGNOSIS:  Undesired fertility  POSTOPERATIVE DIAGNOSIS:  Undesired fertility  PROCEDURE:  Laparoscopic Bilateral Salpingectomy using GelPoint Mini Device   SURGEON: Verita Schneiders, MD  ANESTHESIA:  General endotracheal  COMPLICATIONS:  None immediate.  ESTIMATED BLOOD LOSS:  20 ml.  FLUIDS: 1800 ml LR.  URINE OUTPUT:  30 ml of clear urine.  IINDICATIONS: 38 y.o. [id number] with undesired fertility, desires permanent sterilization. Other reversible forms of contraception were discussed with patient; she declines all other modalities.  Risks of procedure discussed with patient including permanence of method, risk of regret, bleeding, infection, injury to surrounding organs and need for additional procedures including laparotomy.  Failure risk less than 0.5% with increased risk of ectopic gestation if pregnancy occurs was also discussed with patient.  Written informed consent was obtained.    FINDINGS:  Normal uterus, fallopian tubes, and ovaries. 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  TECHNIQUE:  The patient was taken to the operating room where general anesthesia was obtained without difficulty.  She was then placed in the dorsal lithotomy position and prepared and draped in sterile fashion.  After an adequate timeout was performed, a bivalved speculum was then placed in the patient's vagina, and the anterior lip of cervix grasped with the single-tooth tenaculum.  The uterine manipulator was then advanced into the uterus.  The speculum was removed from the vagina.  Attention was then turned to the patient's abdomen where a 2.5-cm skin horizontal incision was made under the umbilical fold on her preexisting scar from previous laparoscopic surgery.  The incision was taken down to the fascia, and the fascia was incision.  The GelPoint Mini Device was then inserted into the abdomen and secured as per  manufacturer's instructions.  Three trocars were placed into the device. The abdomen was then insufflated with carbon dioxide gas.  Adequate pneumoperitoneum was obtained.  A survey of the patient's pelvis and abdomen revealed the findings above.  The fallopian tubes were transected from the uterine attachments and the underlying mesosalpinx with the Harmonic device allowing for bilateral salpingectomy.  The fallopian tubes were then removed from the abdomen under direct visualization.  The operative site was surveyed, and it was found to be hemostatic.   No intraoperative injury to other surrounding organs was noted.  The abdomen was desufflated and all instruments were then removed from the patient's abdomen. The fascial incision was closed with a 0 Vicryl figure of eight stitch.  The skin incision was closed with 4-0 Vicryl subcuticular stitch.  The uterine manipulator was removed from the cervix without complications. The patient tolerated the procedure well.  Sponge, lap, and needle counts were correct times two.  The patient was then taken to the recovery room awake, extubated and in stable condition.  The patient will be discharged to home as per PACU criteria.  Routine postoperative instructions given.  She was prescribed Percocet, Ibuprofen and Colace.  She will follow up in the clinic on 11/05/13 for postoperative evaluation.   Verita Schneiders, MD, [place] Attending [place], [facility]

## 2013-10-10 NOTE — Anesthesia Preprocedure Evaluation (Signed)
Anesthesia Evaluation  Patient identified by MRN, date of birth, ID band Patient awake    Reviewed: Allergy & Precautions, H&P , NPO status , Patient's Chart, lab work & pertinent test results  Airway Mallampati: I TM Distance: >3 FB Neck ROM: full    Dental no notable dental hx. (+) Teeth Intact   Pulmonary former smoker,    Pulmonary exam normal       Cardiovascular negative cardio ROS      Neuro/Psych negative neurological ROS     GI/Hepatic negative GI ROS, Neg liver ROS,   Endo/Other  negative endocrine ROS  Renal/GU negative Renal ROS     Musculoskeletal   Abdominal Normal abdominal exam  (+)   Peds  Hematology   Anesthesia Other Findings   Reproductive/Obstetrics negative OB ROS                           Anesthesia Physical Anesthesia Plan  ASA: II  Anesthesia Plan: General   Post-op Pain Management:    Induction: Intravenous  Airway Management Planned: Oral ETT  Additional Equipment:   Intra-op Plan:   Post-operative Plan: Extubation in OR  Informed Consent: I have reviewed the patients History and Physical, chart, labs and discussed the procedure including the risks, benefits and alternatives for the proposed anesthesia with the patient or authorized representative who has indicated his/her understanding and acceptance.   Dental Advisory Given  Plan Discussed with: CRNA and Surgeon  Anesthesia Plan Comments:         Anesthesia Quick Evaluation

## 2013-10-10 NOTE — Transfer of Care (Signed)
Immediate Anesthesia Transfer of Care Note  Patient: Carrie Douglas  Procedure(s) Performed: Procedure(s) with comments: LAPAROSCOPIC BILATERAL SALPINGECTOMY (Bilateral) - with single site port  Patient Location: PACU  Anesthesia Type:General  Level of Consciousness: awake, alert  and oriented  Airway & Oxygen Therapy: Patient Spontanous Breathing and Patient connected to nasal cannula oxygen  Post-op Assessment: Report given to PACU RN and Post -op Vital signs reviewed and stable  Post vital signs: Reviewed and stable  Complications: No apparent anesthesia complications

## 2013-10-10 NOTE — Anesthesia Postprocedure Evaluation (Signed)
  Anesthesia Post Note  Patient: Carrie Douglas  Procedure(s) Performed: Procedure(s) (LRB): LAPAROSCOPIC BILATERAL SALPINGECTOMY (Bilateral)  Anesthesia type: GA  Patient location: PACU  Post pain: Pain level controlled  Post assessment: Post-op Vital signs reviewed  Last Vitals:  Filed Vitals:   10/10/13 1200  BP: 101/56  Pulse: 66  Temp: 36.3 C  Resp: 21    Post vital signs: Reviewed  Level of consciousness: sedated  Complications: No apparent anesthesia complications

## 2013-10-10 NOTE — H&P (View-Only) (Signed)
   CLINIC ENCOUNTER NOTE  History:  38 y.o. [id number] here today for sterilization consultation. Had LTL in 2007, but had three pregnancies since then (2008, 2012 and 2015).  First two resulted in live births, last one was a MAB requiring D&C.  Patient desires salpingectomy.  The following portions of the patient's history were reviewed and updated as appropriate: allergies, current medications, past family history, past medical history, past social history, past surgical history and problem list.  Normal pap and negative HPV on 3/06/10/13.  Review of Systems:  Pertinent items are noted in HPI.  Objective:  Physical Exam BP 130/81  Pulse 87  Ht 5\' 6"  (1.676 m)  Wt 221 lb 9.6 oz (100.517 kg)  BMI 35.78 kg/m2  LMP 09/10/2013  Breastfeeding? No Gen: NAD Abd: Soft, nontender and nondistended. Well-healed previous laparoscopy scars. Pelvic: Deferred  Assessment & Plan:  Patient desires permanent sterilization in the form of laparoscopic bilateral salpingectomy. Other reversible forms of contraception were discussed with patient; she declines all other modalities. Risks of procedure discussed with patient including but not limited to: risk of regret, permanence of method, bleeding, infection, injury to surrounding organs and need for additional procedures.  Failure risk of <1 % with increased risk of ectopic gestation if pregnancy occurs was also discussed with patient.  Patient verbalized understanding of these risks and wants to proceed with sterilization.  All questions were answered.  She was told that she will be contacted by our surgical scheduler regarding the time and date of her surgery; routine preoperative instructions of having nothing to eat or drink after midnight on the day prior to surgery and also coming to the hospital 1.5 hours prior to her time of surgery were also emphasized.  She was told she may be called for a preoperative appointment about a week prior to surgery and will be  given further preoperative instructions at that visit. Printed patient education handouts about the procedure were given to the patient to review at home.  Of note, Medicaid papers were signed 07/12/13.  Verita Schneiders, MD, [place] Attending [place], [facility]

## 2013-10-10 NOTE — Interval H&P Note (Signed)
History and Physical Interval Note 10/10/2013 9:17 AM  Carrie Douglas  has presented today for surgery, with the diagnosis of desires sterilization   The various methods of treatment have been discussed with the patient and family. After consideration of risks, benefits and other options for treatment, the patient has consented to  [place] SALPINGECTOMY (Bilateral) - planning to do single site with Gel point. arranged for rep to be present. as a surgical intervention .  The patient's history has been reviewed, patient examined, no change in status, stable for surgery.  I have reviewed the patient's chart and labs.  Questions were answered to the patient's satisfaction.  To OR when ready.   Verita Schneiders, MD, [place] Attending [place], [facility]

## 2013-10-11 ENCOUNTER — Encounter (HOSPITAL_COMMUNITY): Payer: Self-pay | Admitting: Obstetrics & Gynecology

## 2013-10-23 ENCOUNTER — Emergency Department (HOSPITAL_COMMUNITY)
Admission: EM | Admit: 2013-10-23 | Discharge: 2013-10-23 | Disposition: A | Discharge status: Home / Self Care | Payer: Medicaid Other | Attending: Emergency Medicine | Admitting: Emergency Medicine

## 2013-10-23 ENCOUNTER — Encounter (HOSPITAL_COMMUNITY): Payer: Self-pay | Admitting: Emergency Medicine

## 2013-10-23 DIAGNOSIS — Z3202 Encounter for pregnancy test, result negative: Secondary | ICD-10-CM | POA: Diagnosis not present

## 2013-10-23 DIAGNOSIS — D649 Anemia, unspecified: Secondary | ICD-10-CM | POA: Insufficient documentation

## 2013-10-23 DIAGNOSIS — E869 Volume depletion, unspecified: Secondary | ICD-10-CM

## 2013-10-23 DIAGNOSIS — Z79899 Other long term (current) drug therapy: Secondary | ICD-10-CM | POA: Diagnosis not present

## 2013-10-23 DIAGNOSIS — Z8619 Personal history of other infectious and parasitic diseases: Secondary | ICD-10-CM | POA: Diagnosis not present

## 2013-10-23 DIAGNOSIS — Z8659 Personal history of other mental and behavioral disorders: Secondary | ICD-10-CM | POA: Diagnosis not present

## 2013-10-23 DIAGNOSIS — F17200 Nicotine dependence, unspecified, uncomplicated: Secondary | ICD-10-CM | POA: Insufficient documentation

## 2013-10-23 DIAGNOSIS — R42 Dizziness and giddiness: Secondary | ICD-10-CM | POA: Diagnosis present

## 2013-10-23 LAB — BASIC METABOLIC PANEL
Anion gap: 13 (ref 5–15)
BUN: 15 mg/dL (ref 6–23)
CALCIUM: 9.5 mg/dL (ref 8.4–10.5)
CO2: 24 meq/L (ref 19–32)
CREATININE: 0.7 mg/dL (ref 0.50–1.10)
Chloride: 102 mEq/L (ref 96–112)
GFR calc Af Amer: >90 mL/min (ref >90)
GFR calc non Af Amer: >90 mL/min (ref >90)
GLUCOSE: 95 mg/dL (ref 70–99)
Potassium: 4.4 mEq/L (ref 3.7–5.3)
Sodium: 139 mEq/L (ref 137–147)

## 2013-10-23 LAB — CBC
HCT: 32.4 % — ABNORMAL LOW (ref 36.0–46.0)
HEMOGLOBIN: 9.6 g/dL — AB (ref 12.0–15.0)
MCH: 21.3 pg — ABNORMAL LOW (ref 26.0–34.0)
MCHC: 29.6 g/dL — ABNORMAL LOW (ref 30.0–36.0)
MCV: 72 fL — AB (ref 78.0–100.0)
Platelets: 318 10*3/uL (ref 150–400)
RBC: 4.5 MIL/uL (ref 3.87–5.11)
RDW: 16 % — ABNORMAL HIGH (ref 11.5–15.5)
WBC: 5.8 10*3/uL (ref 4.0–10.5)

## 2013-10-23 LAB — POC URINE PREG, ED: PREG TEST UR: NEGATIVE

## 2013-10-23 LAB — I-STAT TROPONIN, ED: TROPONIN I, POC: 0 ng/mL (ref 0.00–0.08)

## 2013-10-23 MED ORDER — ONDANSETRON 8 MG PO TBDP: 8.0000 mg | ORAL_TABLET | Freq: Three times a day (TID) | ORAL | Status: DC | PRN

## 2013-10-23 MED ORDER — SODIUM CHLORIDE 0.9 % IV SOLN
1000.0000 mL | Freq: Once | INTRAVENOUS | Status: AC
  Administered 2013-10-23: 1000 mL via INTRAVENOUS

## 2013-10-23 MED ORDER — SODIUM CHLORIDE 0.9 % IV SOLN
1000.0000 mL | INTRAVENOUS | Status: DC
  Administered 2013-10-23: 1000 mL via INTRAVENOUS

## 2013-10-23 NOTE — ED Provider Notes (Signed)
CSN: [id number]     Arrival date & time 10/23/13  1709 History   First MD Initiated Contact with Patient 10/23/13 1742     Chief Complaint  Patient presents with  . Dizziness     (Consider location/radiation/quality/duration/timing/severity/associated sxs/prior Treatment) HPI Patient reports she had isolated nausea and vomiting yesterday.  She denies diarrhea.  She reports no vomiting today but when she got up this morning she felt lightheaded when she stood up.  She denies chest pain or shortness of breath.  No fevers or chills.  She states she's had lightheadedness and dizziness before in the past.  No other complaints.  Symptoms are mild.   Past Medical History  Diagnosis Date  . Rubella     5 months old  . Varicella     42 1/39 years old  . Anemia     blood transfusion 09/14/10  . Anxiety   . Chronic right-sided headaches   . Depression   . Vaginal delivery 1997, 1999, 2002, 2006, 2008, 2012   Past Surgical History  Procedure Laterality Date  . Gastric bypass  2004  . Laparoscopic tubal ligation  2007  . Dilation and evacuation N/A 07/13/2013    Procedure: DILATATION AND EVACUATION;  Surgeon: Woodroe Mode, MD;  Location: [facility];  Service: Gynecology;  Laterality: N/A;  . Laparoscopic bilateral salpingectomy Bilateral 10/10/2013    Procedure: LAPAROSCOPIC BILATERAL SALPINGECTOMY;  Surgeon: Osborne Oman, MD;  Location: [facility];  Service: Gynecology;  Laterality: Bilateral;  with single site port   Family History  Problem Relation Age of Onset  . Lung cancer Father    History  Substance Use Topics  . Smoking status: Current Every Day Smoker -- 0.20 packs/day    Types: Cigarettes  . Smokeless tobacco: Former Systems developer    Quit date: 05/31/2013  . Alcohol Use: No   OB History   Grav Para Term Preterm Abortions TAB SAB Ect Mult Living   7 6 6  0 0 0 0 0 0 6     Review of Systems  All other systems reviewed and are negative.     Allergies  Codeine and Other  Home  Medications   Prior to Admission medications   Medication Sig Start Date End Date Taking? Authorizing Provider  acetaminophen (TYLENOL) 500 MG tablet Take 1,000 mg by mouth every 6 (six) hours as needed for headache.   Yes Historical Provider, MD  docusate sodium (COLACE) 100 MG capsule Take 100 mg by mouth 2 (two) times daily as needed for mild constipation.   Yes Historical Provider, MD  ibuprofen (ADVIL,MOTRIN) 600 MG tablet Take 600 mg by mouth every 6 (six) hours as needed for mild pain.   Yes Historical Provider, MD  IRON PO Take 1 tablet by mouth daily.   Yes Historical Provider, MD  oxyCODONE-acetaminophen (PERCOCET/ROXICET) 5-325 MG per tablet Take 1-2 tablets by mouth every 6 (six) hours as needed. 10/10/13  Yes Osborne Oman, MD  Prenatal Vit-Fe Fumarate-FA (PRENATAL MULTIVITAMIN) TABS tablet Take 1 tablet by mouth daily at 12 noon. 06/01/13  Yes Sid Falcon, MD  promethazine (PHENERGAN) 25 MG tablet Take 25 mg by mouth every 6 (six) hours as needed for nausea or vomiting.   Yes Historical Provider, MD  ondansetron (ZOFRAN ODT) 8 MG disintegrating tablet Take 1 tablet (8 mg total) by mouth every 8 (eight) hours as needed for nausea or vomiting. 10/23/13   Hoy Morn, MD   BP 109/63  Pulse  64  Temp(Src) 98.9 F (37.2 C) (Oral)  Resp 20  SpO2 99%  LMP 10/10/2013 Physical Exam  Nursing note and vitals reviewed. Constitutional: She is oriented to person, place, and time. She appears well-developed and well-nourished. No distress.  HENT:  Head: Normocephalic and atraumatic.  Eyes: EOM are normal.  Neck: Normal range of motion.  Cardiovascular: Normal rate, regular rhythm and normal heart sounds.   Pulmonary/Chest: Effort normal and breath sounds normal.  Abdominal: Soft. She exhibits no distension. There is no tenderness.  Musculoskeletal: Normal range of motion.  Neurological: She is alert and oriented to person, place, and time.  Skin: Skin is warm and dry.   Psychiatric: She has a normal mood and affect. Judgment normal.    ED Course  Procedures (including critical care time) Labs Review Labs Reviewed  CBC - Abnormal; Notable for the following:    Hemoglobin 9.6 (*)    HCT 32.4 (*)    MCV 72.0 (*)    MCH 21.3 (*)    MCHC 29.6 (*)    RDW 16.0 (*)    All other components within normal limits  BASIC METABOLIC PANEL  I-STAT TROPOININ, ED  POC URINE PREG, ED    Imaging Review No results found.   EKG Interpretation   Date/Time:  Tuesday October 23 2013 17:15:25 EDT Ventricular Rate:  84 PR Interval:  142 QRS Duration: 90 QT Interval:  372 QTC Calculation: 439 R Axis:   40 Text Interpretation:  Normal sinus rhythm Cannot rule out Anterior infarct  , age undetermined Abnormal ECG No significant change was found Confirmed  by Jamario Colina  MD, Lennette Bihari ([id number]) on 10/23/2013 5:42:49 PM      MDM   Final diagnoses:  Volume depletion    Patient feels better at this time.  Feels better after IV fluids.  No longer is she lightheaded when she stands up.    Hoy Morn, MD 10/23/13 2116

## 2013-10-23 NOTE — ED Notes (Signed)
Dr. Campos is at the bedside.  

## 2013-10-23 NOTE — ED Notes (Signed)
Pt. Was not feeling good last night before bed. Felt nauseated woke up this am with dizziness and lt. Shoulder pain.    They symptoms are progressively becoming worse.  When she stands up she feels like she is going to pass out. Denies any n/v/d.  Pt. Has a hx of vertigo and dizziness/lightheadness.  Skin is p/w/d Pt. Denies any sob or cold symptoms

## 2013-10-23 NOTE — Discharge Instructions (Signed)
Dehydration, Adult Dehydration is when you lose more fluids from the body than you take in. Vital organs like the kidneys, brain, and heart cannot function without a proper amount of fluids and salt. Any loss of fluids from the body can cause dehydration.  CAUSES   Vomiting.  Diarrhea.  Excessive sweating.  Excessive urine output.  Fever. SYMPTOMS  Mild dehydration  Thirst.  Dry lips.  Slightly dry mouth. Moderate dehydration  Very dry mouth.  Sunken eyes.  Skin does not bounce back quickly when lightly pinched and released.  Dark urine and decreased urine production.  Decreased tear production.  Headache. Severe dehydration  Very dry mouth.  Extreme thirst.  Rapid, weak pulse (more than 100 beats per minute at rest).  Cold hands and feet.  Not able to sweat in spite of heat and temperature.  Rapid breathing.  Blue lips.  Confusion and lethargy.  Difficulty being awakened.  Minimal urine production.  No tears. DIAGNOSIS  Your caregiver will diagnose dehydration based on your symptoms and your exam. Blood and urine tests will help confirm the diagnosis. The diagnostic evaluation should also identify the cause of dehydration. TREATMENT  Treatment of mild or moderate dehydration can often be done at home by increasing the amount of fluids that you drink. It is best to drink small amounts of fluid more often. Drinking too much at one time can make vomiting worse. Refer to the home care instructions below. Severe dehydration needs to be treated at the hospital where you will probably be given intravenous (IV) fluids that contain water and electrolytes. HOME CARE INSTRUCTIONS   Ask your caregiver about specific rehydration instructions.  Drink enough fluids to keep your urine clear or pale yellow.  Drink small amounts frequently if you have nausea and vomiting.  Eat as you normally do.  Avoid:  Foods or drinks high in sugar.  Carbonated  drinks.  Juice.  Extremely hot or cold fluids.  Drinks with caffeine.  Fatty, greasy foods.  Alcohol.  Tobacco.  Overeating.  Gelatin desserts.  Wash your hands well to avoid spreading bacteria and viruses.  Only take over-the-counter or prescription medicines for pain, discomfort, or fever as directed by your caregiver.  Ask your caregiver if you should continue all prescribed and over-the-counter medicines.  Keep all follow-up appointments with your caregiver. SEEK MEDICAL CARE IF:  You have abdominal pain and it increases or stays in one area (localizes).  You have a rash, stiff neck, or severe headache.  You are irritable, sleepy, or difficult to awaken.  You are weak, dizzy, or extremely thirsty. SEEK IMMEDIATE MEDICAL CARE IF:   You are unable to keep fluids down or you get worse despite treatment.  You have frequent episodes of vomiting or diarrhea.  You have blood or green matter (bile) in your vomit.  You have blood in your stool or your stool looks black and tarry.  You have not urinated in 6 to 8 hours, or you have only urinated a small amount of very dark urine.  You have a fever.  You faint. MAKE SURE YOU:   Understand these instructions.  Will watch your condition.  Will get help right away if you are not doing well or get worse. Document Released: 03/29/2005 Document Revised: 06/21/2011 Document Reviewed: 11/16/2010 ExitCare Patient Information 2015 [employer]. This information is not intended to replace advice given to you by your health care provider. Make sure you discuss any questions you have with your health care   provider.  

## 2013-10-23 NOTE — ED Notes (Signed)
Lab called to inform they had urine received.

## 2013-11-05 ENCOUNTER — Telehealth: Payer: Self-pay

## 2013-11-05 ENCOUNTER — Ambulatory Visit: Payer: Medicaid Other | Admitting: Obstetrics & Gynecology

## 2013-11-05 NOTE — Telephone Encounter (Signed)
Patient missed today's appointment for post op BTL. Called patient who states she did not have a ride. Informed patient I would send her information to front desk to reschedule. Spoke to Dr. Harolyn Rutherford who states it is not an urgent need. Patient denies any problems or concerns. Patient to be rescheduled and front office staff to call her with an appointment.

## 2013-11-06 ENCOUNTER — Encounter: Payer: Self-pay | Admitting: Obstetrics & Gynecology

## 2013-12-24 ENCOUNTER — Encounter: Payer: Self-pay | Admitting: Obstetrics & Gynecology

## 2013-12-24 ENCOUNTER — Ambulatory Visit (INDEPENDENT_AMBULATORY_CARE_PROVIDER_SITE_OTHER): Payer: Medicaid Other | Admitting: Obstetrics & Gynecology

## 2013-12-24 VITALS — BP 113/51 | HR 79 | Temp 98.4°F | Wt 225.0 lb

## 2013-12-24 DIAGNOSIS — Z09 Encounter for follow-up examination after completed treatment for conditions other than malignant neoplasm: Secondary | ICD-10-CM

## 2013-12-24 NOTE — Progress Notes (Signed)
   CLINIC ENCOUNTER NOTE  History:  38 y.o. [id number] here today for post-operative visit s/p laparoscopic bilateral salpingectomy. No complaints.  The following portions of the patient's history were reviewed and updated as appropriate: allergies, current medications, past family history, past medical history, past social history, past surgical history and problem list.  Normal pap and negative HRHPV in 07/10/13.  Review of Systems:  Pertinent items are noted in HPI.  Objective:  Physical Exam BP 113/51  Pulse 79  Temp(Src) 98.4 F (36.9 C) (Oral)  Wt 225 lb (102.059 kg) Gen: NAD Abd: Soft, nontender and nondistended. Well healed infraumbilical laparoscopic incision Pelvic:Deferred   Assessment & Plan:  Normal postoperative check Routine preventative health maintenance measures emphasized.   Verita Schneiders, MD, [place] Attending [place] for [employer], [place]

## 2013-12-24 NOTE — Patient Instructions (Signed)
Return to clinic for any scheduled appointments or for any gynecologic concerns as needed.   

## 2013-12-27 ENCOUNTER — Encounter: Payer: Self-pay | Admitting: *Deleted

## 2014-01-21 ENCOUNTER — Encounter (HOSPITAL_COMMUNITY): Payer: Self-pay | Admitting: Emergency Medicine

## 2014-01-21 ENCOUNTER — Emergency Department (HOSPITAL_COMMUNITY)
Admission: EM | Admit: 2014-01-21 | Discharge: 2014-01-21 | Disposition: A | Discharge status: Home / Self Care | Payer: Medicaid Other | Attending: Emergency Medicine | Admitting: Emergency Medicine

## 2014-01-21 DIAGNOSIS — Z791 Long term (current) use of non-steroidal anti-inflammatories (NSAID): Secondary | ICD-10-CM | POA: Insufficient documentation

## 2014-01-21 DIAGNOSIS — D649 Anemia, unspecified: Secondary | ICD-10-CM | POA: Insufficient documentation

## 2014-01-21 DIAGNOSIS — Z720 Tobacco use: Secondary | ICD-10-CM | POA: Insufficient documentation

## 2014-01-21 DIAGNOSIS — Z79899 Other long term (current) drug therapy: Secondary | ICD-10-CM | POA: Insufficient documentation

## 2014-01-21 DIAGNOSIS — M273 Alveolitis of jaws: Secondary | ICD-10-CM | POA: Diagnosis not present

## 2014-01-21 DIAGNOSIS — Z8659 Personal history of other mental and behavioral disorders: Secondary | ICD-10-CM | POA: Diagnosis not present

## 2014-01-21 DIAGNOSIS — Z8619 Personal history of other infectious and parasitic diseases: Secondary | ICD-10-CM | POA: Insufficient documentation

## 2014-01-21 DIAGNOSIS — R6884 Jaw pain: Secondary | ICD-10-CM | POA: Diagnosis present

## 2014-01-21 DIAGNOSIS — G8929 Other chronic pain: Secondary | ICD-10-CM | POA: Insufficient documentation

## 2014-01-21 MED ORDER — ONDANSETRON 4 MG PO TBDP
4.0000 mg | ORAL_TABLET | Freq: Once | ORAL | Status: AC
  Administered 2014-01-21: 4 mg via ORAL
  Filled 2014-01-21: qty 1

## 2014-01-21 MED ORDER — AMOXICILLIN 500 MG PO CAPS: 500.0000 mg | ORAL_CAPSULE | Freq: Three times a day (TID) | ORAL | Status: DC

## 2014-01-21 MED ORDER — OXYCODONE-ACETAMINOPHEN 5-325 MG PO TABS
1.0000 | ORAL_TABLET | Freq: Once | ORAL | Status: AC
  Administered 2014-01-21: 1 via ORAL
  Filled 2014-01-21: qty 1

## 2014-01-21 MED ORDER — OXYCODONE-ACETAMINOPHEN 5-325 MG PO TABS: 1.0000 | ORAL_TABLET | Freq: Three times a day (TID) | ORAL | Status: DC | PRN

## 2014-01-21 NOTE — ED Provider Notes (Signed)
CSN: [id number]     Arrival date & time 01/21/14  1934 History  This chart was scribed for non-physician practitioner, Starlyn Skeans, PA-C working with Tanna Furry, MD by Frederich Balding, ED scribe. This patient was seen in room TR11C/TR11C and the patient's care was started at 9:56 PM.   Chief Complaint  Patient presents with  . Jaw Pain   The history is provided by the patient. No language interpreter was used.   HPI Comments: Carrie Douglas is a 38 y.o. female who presents to the Emergency Department complaining of right jaw pain that started earlier today. Opening her mouth worsens the pain. States she had her right lower premolar extracted on 01/15/14. There was mild pain around the extraction site afterwards but it is now in her jaw. Denies drainage from the area. Pt tried going to her regular dentist today but was told to go to the ED since she couldn't get a follow up appointment with the oral surgeon until 01/30/14. She was given hydrocodone with relief of pain but is now out. States she has taken aleve with no relief. Pt was not placed on an antibiotic after the extraction. Denies fever, chills, facial swelling, trouble swallowing, nausea, emesis.   Past Medical History  Diagnosis Date  . Rubella     5 months old  . Varicella     28 1/39 years old  . Anemia     blood transfusion 09/14/10  . Anxiety   . Chronic right-sided headaches   . Depression   . Vaginal delivery 1997, 1999, 2002, 2006, 2008, 2012   Past Surgical History  Procedure Laterality Date  . Gastric bypass  2004  . Laparoscopic tubal ligation  2007  . Dilation and evacuation N/A 07/13/2013    Procedure: DILATATION AND EVACUATION;  Surgeon: Woodroe Mode, MD;  Location: [facility];  Service: Gynecology;  Laterality: N/A;  . Laparoscopic bilateral salpingectomy Bilateral 10/10/2013    Procedure: LAPAROSCOPIC BILATERAL SALPINGECTOMY;  Surgeon: Osborne Oman, MD;  Location: [facility];  Service: Gynecology;  Laterality:  Bilateral;  with single site port   Family History  Problem Relation Age of Onset  . Lung cancer Father    History  Substance Use Topics  . Smoking status: Current Every Day Smoker -- 0.20 packs/day    Types: Cigarettes  . Smokeless tobacco: Former Systems developer    Quit date: 05/31/2013  . Alcohol Use: No   OB History   Grav Para Term Preterm Abortions TAB SAB Ect Mult Living   7 6 6  0 0 0 0 0 0 6     Review of Systems  Constitutional: Negative for fever and chills.  HENT: Positive for dental problem. Negative for facial swelling and trouble swallowing.   Gastrointestinal: Negative for nausea and vomiting.  All other systems reviewed and are negative.  Allergies  Codeine and Other  Home Medications   Prior to Admission medications   Medication Sig Start Date End Date Taking? Authorizing Provider  ibuprofen (ADVIL,MOTRIN) 600 MG tablet Take 600 mg by mouth every 6 (six) hours as needed for mild pain.   Yes Historical Provider, MD  IRON PO Take 1 tablet by mouth daily.   Yes Historical Provider, MD  naproxen sodium (ANAPROX) 220 MG tablet Take 220 mg by mouth daily as needed.   Yes Historical Provider, MD  Prenatal Vit-Fe Fumarate-FA (PRENATAL MULTIVITAMIN) TABS tablet Take 1 tablet by mouth daily at 12 noon. 06/01/13  Yes Sid Falcon, MD  amoxicillin (AMOXIL) 500 MG capsule Take 1 capsule (500 mg total) by mouth 3 (three) times daily. 01/21/14   Trampas Stettner A Forcucci, PA-C  oxyCODONE-acetaminophen (PERCOCET/ROXICET) 5-325 MG per tablet Take 1 tablet by mouth every 8 (eight) hours as needed for moderate pain or severe pain. 01/21/14   Deepa Barthel A Forcucci, PA-C   BP 122/70  Pulse 86  Temp(Src) 98.5 F (36.9 C)  Resp 16  Wt 230 lb 8 oz (104.554 kg)  SpO2 100%  LMP 01/18/2014  Physical Exam  Nursing note and vitals reviewed. Constitutional: She is oriented to person, place, and time. She appears well-developed and well-nourished. No distress.  HENT:  Head: Normocephalic and  atraumatic.  Mouth/Throat: Uvula is midline, oropharynx is clear and moist and mucous membranes are normal. No trismus in the jaw. No oropharyngeal exudate, posterior oropharyngeal edema, posterior oropharyngeal erythema or tonsillar abscesses.    Multiple previous well healed posterior inferior  extractions.    Eyes: Conjunctivae and EOM are normal. Pupils are equal, round, and reactive to light. No scleral icterus.  Neck: Normal range of motion. Neck supple. No tracheal deviation present. No thyromegaly present.  Cardiovascular: Normal rate, regular rhythm and normal heart sounds.  Exam reveals no gallop and no friction rub.   No murmur heard. Pulmonary/Chest: Effort normal and breath sounds normal. No respiratory distress. She has no wheezes. She has no rhonchi. She has no rales.  Musculoskeletal: Normal range of motion.  Lymphadenopathy:    She has no cervical adenopathy.  Neurological: She is alert and oriented to person, place, and time.  Skin: Skin is warm and dry.  Psychiatric: She has a normal mood and affect. Her behavior is normal. Judgment and thought content normal.    ED Course  Procedures (including critical care time)  DIAGNOSTIC STUDIES: Oxygen Saturation is 100% on RA, normal by my interpretation.    COORDINATION OF CARE: 10:04 PM-Discussed treatment plan which includes pain medication in the ED and having Dr. Jeneen Rinks come in with pt at bedside and pt agreed to plan.   Labs Review Labs Reviewed - No data to display  Imaging Review No results found.   EKG Interpretation None      MDM   Final diagnoses:  Dry socket   Patient is a 38 y.o. Female with jaw pain that is secondary to tooth extraction.  Exam reveals dry socket.  There is no evidence of trismus or ludwigs angina.  Will discharge the patient home with percocet and amoxicillin.  Patient to follow-up with her oral surgeon.  Patient is stable for discharge at this time.  Patient to return for signs of  ludwigs angina or trismus.    I personally performed the services described in this documentation, which was scribed in my presence. The recorded information has been reviewed and is accurate.  Cherylann Parr, PA-C 01/21/14 2239

## 2014-01-21 NOTE — Discharge Instructions (Signed)
Dental Dry Socket After a tooth is pulled (extracted), blood fills up the hole (socket) where the tooth once was. This blood hardens (clots) and protects the bone and nerves underneath. Normally, gums completely grow over the top of the bones and nerves and close an open socket in about a week. After several months, the clot is replaced by bone that grows into the socket. However, when blood does not fill up the extraction socket, or the blood clot is lost for some reason, a dry socket may form. This condition leaves the bone and nerves exposed to air, food, liquid, or anything else that enters the mouth. The gums cannot grow over the extraction socket because there is nothing to grow over, and the dry socket remains open.  CAUSES   Blood not filling up the extraction socket properly.  Anything that can dislodge a forming blood clot. Forceful spitting or sucking through a straw can pull a blood clot completely out of the socket and cause a dry socket.  Having a difficult extraction. The forceful pushing against the wall of the socket when the tooth is extracted causes the walls of the tooth socket to become crushed. This prevents bleeding into the socket because the blood vessels have been crushed closed.  Alcoholic drinks may dry out the blood clot and cause a dry socket.  Smoking can disturb blood clot formation and cause a dry socket. Factors that put you at an increased risk for a dry socket include:   Having lower teeth extracted.  Being female.  Poor oral hygiene.  Taking birth control pills.  Having your wisdom teeth extracted. SYMPTOMS  Severe, constant, dull throbbing pain. The pain generally begins 2 to 3 days after the tooth extraction. The pain may last about a week after it begins.  Bad smelling breath and bad taste in your mouth.  Ear pain. HOME CARE INSTRUCTIONS  Follow your dentist's instructions.  Only take over-the-counter or prescription medicines for pain,  discomfort, or fever as directed by your caregiver. If pain medication does not relieve the pain, you may need to see your dentistwho can clean the socket and place a medicated dressing on the extraction to promote healing.  Take your antibiotics as told, if prescribed. Finish them even if you start to feel better.  Wait at least a day before rinsing with warm salt water to avoid possibly dissolving the new blood clot. When salt water rinsing, spit gently to avoid pressure on the clot.  Avoid carbonated beverages.  Avoid alcohol.  Avoid smoking for a few days after surgery. Patients who have recently had oral surgery should avoid anything that may irritate the extraction socket or anything that may cause the blood clot inside the extraction socket from being dislodged. Carefully follow your instructions for after surgery care.  SEEK IMMEDIATE DENTAL CARE IF:  Your medicine does not relieve pain.  You have uncontrolled bleeding, marked swelling, or severe pain.  You develop a fever above 102 F (38.9 C), not controlled by medication.  You have difficulty swallowing or cannot open your mouth.  You have other severe symptoms. Document Released: 10/03/2002 Document Revised: 06/21/2011 Document Reviewed: 07/01/2010 [facility] Patient Information 2015 [place], [place]. This information is not intended to replace advice given to you by your health care provider. Make sure you discuss any questions you have with your health care provider.

## 2014-01-21 NOTE — ED Notes (Signed)
Declined W/C at D/C and was escorted to lobby by RN. 

## 2014-01-21 NOTE — ED Notes (Signed)
Pt. reports right jaw pain onset today , states s/p right lower premolar extracted last week .

## 2014-01-27 NOTE — ED Provider Notes (Signed)
Medical screening examination/treatment/procedure(s) were performed by non-physician practitioner and as supervising physician I was immediately available for consultation/collaboration.   EKG Interpretation None        Tanna Furry, MD 01/27/14 [phone]

## 2014-02-11 ENCOUNTER — Encounter (HOSPITAL_COMMUNITY): Payer: Self-pay | Admitting: Emergency Medicine

## 2014-04-12 HISTORY — PX: LAPAROSCOPIC ASSISTED VAGINAL HYSTERECTOMY: SHX5398

## 2014-07-02 ENCOUNTER — Emergency Department (HOSPITAL_COMMUNITY): Payer: Medicaid Other

## 2014-07-02 ENCOUNTER — Inpatient Hospital Stay (HOSPITAL_COMMUNITY)
Admission: EM | Admit: 2014-07-02 | Discharge: 2014-07-03 | DRG: 812 | Disposition: A | Discharge status: Home / Self Care | Payer: Medicaid Other | Attending: Internal Medicine | Admitting: Internal Medicine

## 2014-07-02 ENCOUNTER — Encounter (HOSPITAL_COMMUNITY): Payer: Self-pay | Admitting: Neurology

## 2014-07-02 DIAGNOSIS — Z9884 Bariatric surgery status: Secondary | ICD-10-CM

## 2014-07-02 DIAGNOSIS — D649 Anemia, unspecified: Secondary | ICD-10-CM

## 2014-07-02 DIAGNOSIS — D509 Iron deficiency anemia, unspecified: Principal | ICD-10-CM | POA: Diagnosis present

## 2014-07-02 DIAGNOSIS — F419 Anxiety disorder, unspecified: Secondary | ICD-10-CM | POA: Diagnosis present

## 2014-07-02 DIAGNOSIS — F329 Major depressive disorder, single episode, unspecified: Secondary | ICD-10-CM | POA: Diagnosis present

## 2014-07-02 DIAGNOSIS — R197 Diarrhea, unspecified: Secondary | ICD-10-CM

## 2014-07-02 DIAGNOSIS — Z79899 Other long term (current) drug therapy: Secondary | ICD-10-CM

## 2014-07-02 DIAGNOSIS — K561 Intussusception: Secondary | ICD-10-CM | POA: Diagnosis present

## 2014-07-02 DIAGNOSIS — R112 Nausea with vomiting, unspecified: Secondary | ICD-10-CM | POA: Diagnosis not present

## 2014-07-02 DIAGNOSIS — N920 Excessive and frequent menstruation with regular cycle: Secondary | ICD-10-CM | POA: Diagnosis present

## 2014-07-02 DIAGNOSIS — A084 Viral intestinal infection, unspecified: Secondary | ICD-10-CM | POA: Diagnosis present

## 2014-07-02 DIAGNOSIS — R079 Chest pain, unspecified: Secondary | ICD-10-CM | POA: Diagnosis not present

## 2014-07-02 DIAGNOSIS — Z23 Encounter for immunization: Secondary | ICD-10-CM | POA: Diagnosis not present

## 2014-07-02 DIAGNOSIS — F17210 Nicotine dependence, cigarettes, uncomplicated: Secondary | ICD-10-CM | POA: Diagnosis present

## 2014-07-02 DIAGNOSIS — F32A Depression, unspecified: Secondary | ICD-10-CM | POA: Diagnosis present

## 2014-07-02 HISTORY — DX: Unspecified osteoarthritis, unspecified site: M19.90

## 2014-07-02 HISTORY — DX: Personal history of other medical treatment: Z92.89

## 2014-07-02 HISTORY — DX: Iron deficiency anemia, unspecified: D50.9

## 2014-07-02 LAB — BASIC METABOLIC PANEL
Anion gap: 7 (ref 5–15)
BUN: 13 mg/dL (ref 6–23)
CO2: 21 mmol/L (ref 19–32)
CREATININE: 0.77 mg/dL (ref 0.50–1.10)
Calcium: 9 mg/dL (ref 8.4–10.5)
Chloride: 109 mmol/L (ref 96–112)
GFR calc Af Amer: >90 mL/min (ref >90)
GLUCOSE: 138 mg/dL — AB (ref 70–99)
Potassium: 4.4 mmol/L (ref 3.5–5.1)
Sodium: 137 mmol/L (ref 135–145)

## 2014-07-02 LAB — PREPARE RBC (CROSSMATCH)

## 2014-07-02 LAB — HEPATIC FUNCTION PANEL
ALBUMIN: 4 g/dL (ref 3.5–5.2)
ALT: 9 U/L (ref 0–35)
AST: 16 U/L (ref 0–37)
Alkaline Phosphatase: 54 U/L (ref 39–117)
BILIRUBIN TOTAL: 0.3 mg/dL (ref 0.3–1.2)
Bilirubin, Direct: <0.1 mg/dL (ref 0.0–0.5)
Total Protein: 6.9 g/dL (ref 6.0–8.3)

## 2014-07-02 LAB — CBC
HCT: 23.2 % — ABNORMAL LOW (ref 36.0–46.0)
HEMOGLOBIN: 6.3 g/dL — AB (ref 12.0–15.0)
MCH: 16.8 pg — ABNORMAL LOW (ref 26.0–34.0)
MCHC: 27.2 g/dL — ABNORMAL LOW (ref 30.0–36.0)
MCV: 62 fL — AB (ref 78.0–100.0)
Platelets: 247 10*3/uL (ref 150–400)
RBC: 3.74 MIL/uL — AB (ref 3.87–5.11)
RDW: 18.4 % — ABNORMAL HIGH (ref 11.5–15.5)
WBC: 7.5 10*3/uL (ref 4.0–10.5)

## 2014-07-02 LAB — LACTIC ACID, PLASMA: LACTIC ACID, VENOUS: 0.9 mmol/L (ref 0.5–2.0)

## 2014-07-02 LAB — RETICULOCYTES
RBC.: 3.75 MIL/uL — AB (ref 3.87–5.11)
RETIC COUNT ABSOLUTE: 26.3 10*3/uL (ref 19.0–186.0)
Retic Ct Pct: 0.7 % (ref 0.4–3.1)

## 2014-07-02 LAB — APTT: APTT: 28 s (ref 24–37)

## 2014-07-02 LAB — PROTIME-INR
INR: 1.3 (ref 0.00–1.49)
PROTHROMBIN TIME: 16.3 s — AB (ref 11.6–15.2)

## 2014-07-02 LAB — I-STAT TROPONIN, ED: TROPONIN I, POC: 0 ng/mL (ref 0.00–0.08)

## 2014-07-02 LAB — LACTATE DEHYDROGENASE: LDH: 95 U/L (ref 94–250)

## 2014-07-02 LAB — TROPONIN I: Troponin I: <0.03 ng/mL (ref <0.031)

## 2014-07-02 LAB — POC OCCULT BLOOD, ED: Fecal Occult Bld: NEGATIVE

## 2014-07-02 LAB — I-STAT BETA HCG BLOOD, ED (MC, WL, AP ONLY): I-stat hCG, quantitative: <5 m[IU]/mL (ref <5)

## 2014-07-02 LAB — TSH: TSH: 2.729 u[IU]/mL (ref 0.350–4.500)

## 2014-07-02 LAB — LIPASE, BLOOD: Lipase: 29 U/L (ref 11–59)

## 2014-07-02 MED ORDER — CALCIUM CARBONATE 1250 (500 CA) MG PO TABS
3.0000 | ORAL_TABLET | Freq: Every day | ORAL | Status: DC
  Administered 2014-07-03: 1500 mg via ORAL
  Filled 2014-07-02 (×2): qty 3

## 2014-07-02 MED ORDER — ONDANSETRON HCL 4 MG/2ML IJ SOLN: 4.0000 mg | Freq: Three times a day (TID) | INTRAMUSCULAR | Status: DC | PRN

## 2014-07-02 MED ORDER — PNEUMOCOCCAL VAC POLYVALENT 25 MCG/0.5ML IJ INJ
0.5000 mL | INJECTION | INTRAMUSCULAR | Status: AC
  Administered 2014-07-03: 0.5 mL via INTRAMUSCULAR
  Filled 2014-07-02: qty 0.5

## 2014-07-02 MED ORDER — IRON 325 (65 FE) MG PO TABS: 1.0000 | ORAL_TABLET | Freq: Every day | ORAL | Status: DC

## 2014-07-02 MED ORDER — SODIUM CHLORIDE 0.9 % IV BOLUS (SEPSIS): 1000.0000 mL | Freq: Once | INTRAVENOUS | Status: DC

## 2014-07-02 MED ORDER — LORATADINE 10 MG PO TABS
10.0000 mg | ORAL_TABLET | Freq: Every day | ORAL | Status: DC
  Administered 2014-07-02 – 2014-07-03 (×2): 10 mg via ORAL
  Filled 2014-07-02 (×2): qty 1

## 2014-07-02 MED ORDER — ADULT MULTIVITAMIN W/MINERALS CH
1.0000 | ORAL_TABLET | Freq: Every day | ORAL | Status: DC
  Administered 2014-07-02 – 2014-07-03 (×2): 1 via ORAL
  Filled 2014-07-02 (×2): qty 1

## 2014-07-02 MED ORDER — MORPHINE SULFATE 2 MG/ML IJ SOLN: 2.0000 mg | INTRAMUSCULAR | Status: DC | PRN

## 2014-07-02 MED ORDER — INFLUENZA VAC SPLIT QUAD 0.5 ML IM SUSY
0.5000 mL | PREFILLED_SYRINGE | INTRAMUSCULAR | Status: AC
  Administered 2014-07-03: 0.5 mL via INTRAMUSCULAR
  Filled 2014-07-02: qty 0.5

## 2014-07-02 MED ORDER — ACETAMINOPHEN 325 MG PO TABS: 650.0000 mg | ORAL_TABLET | Freq: Four times a day (QID) | ORAL | Status: DC | PRN

## 2014-07-02 MED ORDER — ONDANSETRON HCL 4 MG/2ML IJ SOLN
4.0000 mg | Freq: Once | INTRAMUSCULAR | Status: AC
  Administered 2014-07-02: 4 mg via INTRAVENOUS
  Filled 2014-07-02: qty 2

## 2014-07-02 MED ORDER — SODIUM CHLORIDE 0.9 % IV SOLN
INTRAVENOUS | Status: DC
Start: 2014-07-02 — End: 2014-07-03
  Administered 2014-07-02 – 2014-07-03 (×2): via INTRAVENOUS

## 2014-07-02 MED ORDER — ALUM & MAG HYDROXIDE-SIMETH 200-200-20 MG/5ML PO SUSP: 30.0000 mL | Freq: Four times a day (QID) | ORAL | Status: DC | PRN

## 2014-07-02 MED ORDER — CALCIUM 600 MG PO TABS
1800.0000 mg | ORAL_TABLET | Freq: Every day | ORAL | Status: DC
  Filled 2014-07-02: qty 3

## 2014-07-02 MED ORDER — SODIUM CHLORIDE 0.9 % IV BOLUS (SEPSIS)
1000.0000 mL | Freq: Once | INTRAVENOUS | Status: AC
  Administered 2014-07-02: 1000 mL via INTRAVENOUS

## 2014-07-02 MED ORDER — SODIUM CHLORIDE 0.9 % IV SOLN
10.0000 mL/h | Freq: Once | INTRAVENOUS | Status: AC
  Administered 2014-07-02: 10 mL/h via INTRAVENOUS

## 2014-07-02 MED ORDER — SODIUM CHLORIDE 0.9 % IJ SOLN
3.0000 mL | Freq: Two times a day (BID) | INTRAMUSCULAR | Status: DC
Start: 2014-07-02 — End: 2014-07-03
  Administered 2014-07-02 – 2014-07-03 (×2): 3 mL via INTRAVENOUS

## 2014-07-02 MED ORDER — FERROUS SULFATE 325 (65 FE) MG PO TABS
325.0000 mg | ORAL_TABLET | Freq: Every day | ORAL | Status: DC
  Administered 2014-07-03: 325 mg via ORAL
  Filled 2014-07-02 (×2): qty 1

## 2014-07-02 MED ORDER — ACETAMINOPHEN 650 MG RE SUPP: 650.0000 mg | Freq: Four times a day (QID) | RECTAL | Status: DC | PRN

## 2014-07-02 NOTE — ED Notes (Signed)
Hospitalist at bedside 

## 2014-07-02 NOTE — ED Notes (Signed)
LAB CALLED HGB 6.3.

## 2014-07-02 NOTE — ED Notes (Signed)
Remained with pt first 15 minutes of transfusion. Vital signs stable. No signs of transfusion reaction.

## 2014-07-02 NOTE — ED Provider Notes (Signed)
CSN: [id number]     Arrival date & time 07/02/14  1612 History   First MD Initiated Contact with Patient 07/02/14 1729     Chief Complaint  Patient presents with  . Chest Pain     Patient is a 39 y.o. female presenting with chest pain. The history is provided by the patient. No language interpreter was used.  Chest Pain  Carrie Douglas presents for evaluation of chest pain. She reports that she's had an illness with vomiting and diarrhea for the last 3 days. She reports multiple episodes of clear emesis daily and green stools daily. Today she tried to go eat [place] fries at [facility] and had immediate vomiting. Since around noon today she's had a right-sided chest/shoulder heaviness sensation with some shortness of breath and lightheadedness. 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 Last menses was the beginning of the month and was normal for her (heavy). She denies any current active bleeding. Symptoms are moderate, constant, worsening.  Past Medical History  Diagnosis Date  . Rubella     5 months old  . Varicella     31 1/39 years old  . Anemia     blood transfusion 09/14/10  . Anxiety   . Chronic right-sided headaches   . Depression   . Vaginal delivery 1997, 1999, 2002, 2006, 2008, 2012   Past Surgical History  Procedure Laterality Date  . Gastric bypass  2004  . Laparoscopic tubal ligation  2007  . Dilation and evacuation N/A 07/13/2013    Procedure: DILATATION AND EVACUATION;  Surgeon: Woodroe Mode, MD;  Location: [facility];  Service: Gynecology;  Laterality: N/A;  . Laparoscopic bilateral salpingectomy Bilateral 10/10/2013    Procedure: LAPAROSCOPIC BILATERAL SALPINGECTOMY;  Surgeon: Osborne Oman, MD;  Location: [facility];  Service: Gynecology;  Laterality: Bilateral;  with single site port   Family History  Problem Relation Age of Onset  . Lung cancer Father    History  Substance Use Topics  . Smoking status:  Current Every Day Smoker -- 0.20 packs/day    Types: Cigarettes  . Smokeless tobacco: Former Systems developer    Quit date: 05/31/2013  . Alcohol Use: No   OB History    Gravida Para Term Preterm AB TAB SAB Ectopic Multiple Living   7 6 6  0 0 0 0 0 0 6     Review of Systems  Cardiovascular: Positive for chest pain.  All other systems reviewed and are negative.     Allergies  Codeine and Other  Home Medications   Prior to Admission medications   Medication Sig Start Date End Date Taking? Authorizing Provider  CALCIUM PO Take 1 tablet by mouth daily.   Yes Historical Provider, MD  cetirizine (ZYRTEC) 10 MG tablet Take 10 mg by mouth every morning. 06/28/14  Yes Historical Provider, MD  IRON PO Take 1 tablet by mouth daily.   Yes Historical Provider, MD  Multiple Vitamins-Minerals (MULTIVITAMIN ADULT PO) Take 1 tablet by mouth daily.   Yes Historical Provider, MD  amoxicillin (AMOXIL) 500 MG capsule Take 1 capsule (500 mg total) by mouth 3 (three) times daily. Patient not taking: Reported on 07/02/2014 01/21/14   Loma Sousa Forcucci, PA-C  ibuprofen (ADVIL,MOTRIN) 600 MG tablet Take 600 mg by mouth every 6 (six) hours as needed for mild pain.    Historical Provider, MD  oxyCODONE-acetaminophen (PERCOCET/ROXICET) 5-325 MG per tablet Take 1 tablet by mouth  every 8 (eight) hours as needed for moderate pain or severe pain. Patient not taking: Reported on 07/02/2014 01/21/14   Starlyn Skeans, PA-C  Prenatal Vit-Fe Fumarate-FA (PRENATAL MULTIVITAMIN) TABS tablet Take 1 tablet by mouth daily at 12 noon. Patient not taking: Reported on 07/02/2014 06/01/13   Sid Falcon, MD   BP 108/73 mmHg  Pulse 58  Temp(Src) 98.3 F (36.8 C) (Oral)  Resp 16  Ht 5\' 5"  (1.651 m)  Wt 235 lb (106.595 kg)  BMI 39.11 kg/m2  SpO2 99%  LMP 06/12/2014 Physical Exam  Constitutional: She is oriented to person, place, and time. She appears well-developed and well-nourished.  HENT:  Head: Normocephalic and atraumatic.   Cardiovascular: Normal rate and regular rhythm.   No murmur heard. Pulmonary/Chest: Effort normal and breath sounds normal. No respiratory distress.  Abdominal: Soft. There is no tenderness. There is no rebound and no guarding.  Musculoskeletal: She exhibits no edema or tenderness.  Neurological: She is alert and oriented to person, place, and time.  Skin: Skin is warm and dry. There is pallor.  Psychiatric: She has a normal mood and affect. Her behavior is normal.  Nursing note and vitals reviewed.   ED Course  Procedures (including critical care time)  CRITICAL CARE Performed by: Quintella Reichert   Total critical care time:30 minutes  Critical care time was exclusive of separately billable procedures and treating other patients.  Critical care was necessary to treat or prevent imminent or life-threatening deterioration.  Critical care was time spent personally by me on the following activities: development of treatment plan with patient and/or surrogate as well as nursing, discussions with consultants, evaluation of patient's response to treatment, examination of patient, obtaining history from patient or surrogate, ordering and performing treatments and interventions, ordering and review of laboratory studies, ordering and review of radiographic studies, pulse oximetry and re-evaluation of patient's condition.   Labs Review Labs Reviewed  CBC - Abnormal; Notable for the following:    RBC 3.74 (*)    Hemoglobin 6.3 (*)    HCT 23.2 (*)    MCV 62.0 (*)    MCH 16.8 (*)    MCHC 27.2 (*)    RDW 18.4 (*)    All other components within normal limits  BASIC METABOLIC PANEL - Abnormal; Notable for the following:    Glucose, Bld 138 (*)    All other components within normal limits  PROTIME-INR - Abnormal; Notable for the following:    Prothrombin Time 16.3 (*)    All other components within normal limits  RETICULOCYTES - Abnormal; Notable for the following:    RBC. 3.75 (*)     All other components within normal limits  CLOSTRIDIUM DIFFICILE BY PCR  HEPATIC FUNCTION PANEL  APTT  TROPONIN I  LACTATE DEHYDROGENASE  TSH  LACTIC ACID, PLASMA  LIPASE, BLOOD  TROPONIN I  TROPONIN I  GI PATHOGEN PANEL BY PCR, STOOL  CBC  HAPTOGLOBIN  VON WILLEBRAND ANTIGEN  HIV ANTIBODY (ROUTINE TESTING)  URINE RAPID DRUG SCREEN (HOSP PERFORMED)  FACTOR 8 ASSAY  FACTOR 9 ASSAY  FACTOR 10 ASSAY  VITAMIN B12  FOLATE  IRON AND TIBC  FERRITIN  BASIC METABOLIC PANEL  I-STAT TROPOININ, ED  I-STAT BETA HCG BLOOD, ED ([facility], [facility], [facility] ONLY)  POC OCCULT BLOOD, ED  TYPE AND SCREEN  PREPARE RBC (CROSSMATCH)    Imaging Review Dg Chest 2 View  07/02/2014   CLINICAL DATA:  Chest pain radiating to the right shoulder starting this  morning.  EXAM: CHEST  2 VIEW  COMPARISON:  05/31/2013  FINDINGS: The heart size and mediastinal contours are within normal limits. Both lungs are clear. The visualized skeletal structures are unremarkable.  IMPRESSION: No active cardiopulmonary disease.   Electronically Signed   By: Van Clines M.D.   On: 07/02/2014 19:09     EKG Interpretation   Date/Time:  Tuesday July 02 2014 16:23:11 EDT Ventricular Rate:  70 PR Interval:  158 QRS Duration: 88 QT Interval:  408 QTC Calculation: 440 R Axis:   23 Text Interpretation:  Normal sinus rhythm Normal ECG Confirmed by Hazle Coca [id number]) on 07/02/2014 5:30:12 PM      MDM   Final diagnoses:  Symptomatic anemia  Chest pain, unspecified chest pain type    Patient here with history of anemia secondary to menorrhagia, not currently on her menses here for chest pain. CBC demonstrates anemia with hemoglobin of 6.3. Rectal exam with brown stool, heme negative. Presentation is not consistent with acute GI bleed. Patient getting transfused for symptomatically anemia with chest pain. Discussed medicine regarding admission for further evaluation and management.  Quintella Reichert, MD 07/03/14 [phone]

## 2014-07-02 NOTE — H&P (Signed)
[facility] History and Physical  [name] HQI:[id number] DOB: 10-20-1975 DOA: 07/02/2014  Referring physician: ED physician PCP: Philis Fendt, MD  Specialists:   Chief Complaint: Heavy menstrual period, weakness, nausea, vomiting, diarrhea, chest pain  HPI: Carrie Douglas is a 39 y.o. female with past medical history of depression, anxiety disorder, history of gastric bypass 2007, headache, intussusception of jejunum, heavy menstrual period, iron deficiency anemia, who presents with heavy menstrual period, weakness, nausea, vomiting, diarrhea and chest pain.  Patient reports that in the past 4 days, she has been having nausea, vomiting and diarrhea. She has vomited 3-4 times each day and had 3-4 times of watery diarrhea. She has mild abdominal pain. She reports that the whole family has similar symptoms recently. She had subjective fever 3 days ago, but no fever currently. No chills.  Patient reports that since year 2007, she has been having heavy menstrual period. She has menstrual period every month, but in the first 5 days, she always has heavy bleeding. Her menarche was at age 61. Last menstrual period was on 06/11/14. She has 6 living children and one miscarriage in the past. Today she feels very weak. She has mild chest pain, which is located over right upper chest. It is not pleuritic. Patient denies fever cough, dysuria, urgency, frequency, hematuria, skin rashes or leg swelling. No unilateral weakness, numbness or tingling sensations. No vision change or hearing loss.  In ED, patient was found to have hemoglobin drop from 9.6 on 10/23/13 to 6.3 today. Troponin negative. EKG no ischemic change. Temperature normal. No tachycardia. Electrolytes okay. FOBT negative. Pregnancy test negative. Chest x-ray is negative for acute abnormalities. Patient is admitted to inpatient for further evaluation and treatment.  Review of Systems: As presented in the history of presenting illness, rest  negative.  Where does patient live?  At home Can patient participate in ADLs? Yes  Allergy:  Allergies  Allergen Reactions  . Codeine Nausea And Vomiting    Tylenol w/codeine makes her very sick.  . Other Itching    Burning Antibiotic for urinary tract infection:  Thinks cipro    Past Medical History  Diagnosis Date  . Rubella     5 months old  . Varicella     29 1/40 years old  . Anemia     blood transfusion 09/14/10  . Anxiety   . Chronic right-sided headaches   . Depression   . Vaginal delivery 1997, 1999, 2002, 2006, 2008, 2012    Past Surgical History  Procedure Laterality Date  . Gastric bypass  2004  . Laparoscopic tubal ligation  2007  . Dilation and evacuation N/A 07/13/2013    Procedure: DILATATION AND EVACUATION;  Surgeon: Woodroe Mode, MD;  Location: [facility];  Service: Gynecology;  Laterality: N/A;  . Laparoscopic bilateral salpingectomy Bilateral 10/10/2013    Procedure: LAPAROSCOPIC BILATERAL SALPINGECTOMY;  Surgeon: Osborne Oman, MD;  Location: [facility];  Service: Gynecology;  Laterality: Bilateral;  with single site port    Social History:  reports that she has been smoking Cigarettes.  She has been smoking about 0.20 packs per day. She quit smokeless tobacco use about 13 months ago. She reports that she does not drink alcohol or use illicit drugs.  Family History:  Family History  Problem Relation Age of Onset  . Lung cancer Father      Prior to Admission medications   Medication Sig Start Date End Date Taking? Authorizing Provider  CALCIUM PO Take 1 tablet by  mouth daily.   Yes Historical Provider, MD  cetirizine (ZYRTEC) 10 MG tablet Take 10 mg by mouth every morning. 06/28/14  Yes Historical Provider, MD  IRON PO Take 1 tablet by mouth daily.   Yes Historical Provider, MD  Multiple Vitamins-Minerals (MULTIVITAMIN ADULT PO) Take 1 tablet by mouth daily.   Yes Historical Provider, MD  amoxicillin (AMOXIL) 500 MG capsule Take 1 capsule (500 mg total) by  mouth 3 (three) times daily. Patient not taking: Reported on 07/02/2014 01/21/14   Loma Sousa Forcucci, PA-C  ibuprofen (ADVIL,MOTRIN) 600 MG tablet Take 600 mg by mouth every 6 (six) hours as needed for mild pain.    Historical Provider, MD  oxyCODONE-acetaminophen (PERCOCET/ROXICET) 5-325 MG per tablet Take 1 tablet by mouth every 8 (eight) hours as needed for moderate pain or severe pain. Patient not taking: Reported on 07/02/2014 01/21/14   Starlyn Skeans, PA-C  Prenatal Vit-Fe Fumarate-FA (PRENATAL MULTIVITAMIN) TABS tablet Take 1 tablet by mouth daily at 12 noon. Patient not taking: Reported on 07/02/2014 06/01/13   Sid Falcon, MD    Physical Exam: Filed Vitals:   07/02/14 1913 07/02/14 1915 07/02/14 1930 07/02/14 1934  BP: 107/55 107/56 98/79 101/54  Pulse: 60 59 64 61  Temp: 98.1 F (36.7 C)   97.9 F (36.6 C)  TempSrc: Oral   Oral  Resp: 19 20 16 20   Height:      Weight:      SpO2: 99% 98% 99%    General: Not in acute distress. Pale looking.  HEENT:       Eyes: PERRL, EOMI, no scleral icterus       ENT: No discharge from the ears and nose, no pharynx injection, no tonsillar enlargement.        Neck: No JVD, no bruit, no mass felt. Cardiac: S1/S2, RRR, No murmurs, No gallops or rubs Pulm: Good air movement bilaterally. Clear to auscultation bilaterally. No rales, wheezing, rhonchi or rubs. Abd: Soft, nondistended, diffused and mild tenderness, no rebound pain, no organomegaly, BS present Ext: No edema bilaterally. 2+DP/PT pulse bilaterally Musculoskeletal: No joint deformities, erythema, or stiffness, ROM full Skin: No rashes.  Neuro: Alert and oriented X3, cranial nerves II-XII grossly intact, muscle strength 5/5 in all extremeties, sensation to light touch intact. Brachial reflex 2+ bilaterally. Knee reflex 1+ bilaterally. Negative Babinski's sign. Normal finger to nose test. Psych: Patient is not psychotic, no suicidal or hemocidal ideation.  Labs on Admission:   Basic Metabolic Panel:  Recent Labs Lab 07/02/14 1627  NA 137  K 4.4  CL 109  CO2 21  GLUCOSE 138*  BUN 13  CREATININE 0.77  CALCIUM 9.0   Liver Function Tests:  Recent Labs Lab 07/02/14 1740  AST 16  ALT 9  ALKPHOS 54  BILITOT 0.3  PROT 6.9  ALBUMIN 4.0   No results for input(s): LIPASE, AMYLASE in the last 168 hours. No results for input(s): AMMONIA in the last 168 hours. CBC:  Recent Labs Lab 07/02/14 1627  WBC 7.5  HGB 6.3*  HCT 23.2*  MCV 62.0*  PLT 247   Cardiac Enzymes: No results for input(s): CKTOTAL, CKMB, CKMBINDEX, TROPONINI in the last 168 hours.  BNP (last 3 results) No results for input(s): BNP in the last 8760 hours.  ProBNP (last 3 results) No results for input(s): PROBNP in the last 8760 hours.  CBG: No results for input(s): GLUCAP in the last 168 hours.  Radiological Exams on Admission: Dg Chest 2 View  07/02/2014  CLINICAL DATA:  Chest pain radiating to the right shoulder starting this morning.  EXAM: CHEST  2 VIEW  COMPARISON:  05/31/2013  FINDINGS: The heart size and mediastinal contours are within normal limits. Both lungs are clear. The visualized skeletal structures are unremarkable.  IMPRESSION: No active cardiopulmonary disease.   Electronically Signed   By: Van Clines M.D.   On: 07/02/2014 19:09    EKG: Independently reviewed.   Assessment/Plan Principal Problem:   Symptomatic anemia Active Problems:   Intussusception of jejunum   Nausea vomiting and diarrhea   Anemia   Chest pain   Depression  Symptomatic anemia: This is most likely due to heavy menstrual periods. Patient has anemia panel done on 05/31/13 which showed iron deficiency anemia with iron 10 and ferritin less than 1. Patient is currently taking multiple vitamin and iron supplement. She has not been seen by OB/GYN doctor since 08/2013. She denies hx of fibroid or family history of blood disorder. Patient's total bilirubin is normal, indicating less  likely to have to hemolysis.  -will admit to tele bed. -1 units of blood transfusion was started by ED - transvaginal ultrasound  - check von willebrand, Factor 8, 9,10, inr, PTT, LDH and haptoglobin - anemia panel - continue iron supplement - CBC in AM (pateint is not actively bleeding now) - check lactic acid level - gynecology referral   Nausea, vomiting, diarrhea: It is most likely due to viral gastroenteritis given whole family has similar symptoms recently. She is hemodynamically stable. Electrolytes okay. -will check c diff prc and GI path panel -IVF: 2L bolus of NS, followed by 125 cc/h -morphine prn for abdominal pain -lipase  Chest pain: Most likely due to demanding ischemia secondary to severe anemia. His EKG has no ischemic change. Chest pain is mild. -transfuse 1 unit of blood -IVF as above -trop x 3 -ekg in AM  Depression: Stable. Not on medications at home. No suicidal homicidal ideations. -Observe closely.   DVT ppx: SCD  Code Status: Full code Family Communication: None at bed side.    Disposition Plan: Admit to inpatient   Date of Service 07/02/2014    Ivor Costa [facility] Pager [phone]  If 7PM-7AM, please contact night-coverage [email or web address] Password [facility] 07/02/2014, 7:57 PM

## 2014-07-02 NOTE — ED Notes (Signed)
MD Rees at bedside. 

## 2014-07-02 NOTE — ED Notes (Signed)
Per MD Blaine Hamper, keep blood transfusion rate at 120/hr.

## 2014-07-02 NOTE — ED Notes (Signed)
Pt reports cp today at 11 after eating [employer]. Currently c/o right shoulder pain and right cp. Feels sob. Has been vomiting since Saturday and having diarrhea. Denies cardiac hx. Pt is a x 4

## 2014-07-03 ENCOUNTER — Inpatient Hospital Stay (HOSPITAL_COMMUNITY): Payer: Medicaid Other

## 2014-07-03 DIAGNOSIS — N920 Excessive and frequent menstruation with regular cycle: Secondary | ICD-10-CM | POA: Diagnosis present

## 2014-07-03 LAB — IRON AND TIBC
Iron: 36 ug/dL — ABNORMAL LOW (ref 42–145)
SATURATION RATIOS: 9 % — AB (ref 20–55)
TIBC: 384 ug/dL (ref 250–470)
UIBC: 348 ug/dL (ref 125–400)

## 2014-07-03 LAB — PREPARE RBC (CROSSMATCH)

## 2014-07-03 LAB — HIV ANTIBODY (ROUTINE TESTING W REFLEX): HIV Screen 4th Generation wRfx: NONREACTIVE

## 2014-07-03 LAB — FERRITIN: Ferritin: 4 ng/mL — ABNORMAL LOW (ref 10–291)

## 2014-07-03 LAB — VITAMIN B12: VITAMIN B 12: 231 pg/mL (ref 211–911)

## 2014-07-03 LAB — TROPONIN I: Troponin I: <0.03 ng/mL (ref <0.031)

## 2014-07-03 LAB — FOLATE: Folate: 8.9 ng/mL

## 2014-07-03 LAB — GLUCOSE, CAPILLARY: Glucose-Capillary: 99 mg/dL (ref 70–99)

## 2014-07-03 MED ORDER — SODIUM CHLORIDE 0.9 % IV SOLN
Freq: Once | INTRAVENOUS | Status: AC
  Administered 2014-07-03: 09:00:00 via INTRAVENOUS

## 2014-07-03 MED ORDER — FERROUS SULFATE 325 (65 FE) MG PO TABS: 325.0000 mg | ORAL_TABLET | Freq: Two times a day (BID) | ORAL | Status: DC

## 2014-07-03 NOTE — Discharge Summary (Signed)
Physician Discharge Summary  Carrie Douglas KGU:[id number] DOB: 07-Jul-1975 DOA: 07/02/2014  PCP: Philis Fendt, MD  Admit date: 07/02/2014 Discharge date: 07/03/2014  Time spent: 45 minutes  Recommendations for Outpatient Follow-up:  1. Dr.Anyanwu Gynecology on 3/29 for definitive management of menorrhagia  Discharge Diagnoses:  Principal Problem:   Symptomatic anemia Active Problems:   Nausea vomiting and diarrhea   Anemia   Chest pain   Depression   Menorrhagia   Discharge Condition: stable  Diet recommendation: regular  Filed Weights   07/02/14 1624  Weight: 106.595 kg (235 lb)    History of present illness:  Carrie Douglas is a 39 y.o. female with past medical history of depression, anxiety disorder, history of gastric bypass 2007, headache, intussusception of jejunum, heavy menstrual period, iron deficiency anemia, who presented to the ER  with heavy menstrual period, weakness, nausea, vomiting, diarrhea and chest pain. In ED, patient was found to have hemoglobin drop from 9.6 on 10/23/13 to 6.3 today.  Hospital Course:  1. Menorrhagia -with long standing heavy menstrual cycles -transfused 2units PRBC and symptomatically much improved without any residual symptoms -called her gynecologists office and made her a FU on 3/29 for definitive management pf her menorrhagia  2. Severe Iron deficiency anemia -continue Po Iron and definitive management of above problem  3.  Nausea/Vomiting and diarrhea -likely gastroenteritis, resolved  4. Atypical chest pain -due to gastritis from 4, resolved -cardiac enzymes negative, EKG normal  Discharge Exam: Filed Vitals:   07/03/14 1155  BP: 96/47  Pulse: 58  Temp: 98 F (36.7 C)  Resp: 18    General: AAOx3 Cardiovascular: S1S2/RRR Respiratory: CTAB  Discharge Instructions   Discharge Instructions    Diet general    Complete by:  As directed      Increase activity slowly    Complete by:  As directed            Discharge Medication List as of 07/03/2014 11:26 AM    CONTINUE these medications which have CHANGED   Details  ferrous sulfate 325 (65 FE) MG tablet Take 1 tablet (325 mg total) by mouth 2 (two) times daily with a meal., Starting 07/03/2014, Until Discontinued, Normal      CONTINUE these medications which have NOT CHANGED   Details  CALCIUM PO Take 1 tablet by mouth daily., Until Discontinued, Historical Med    cetirizine (ZYRTEC) 10 MG tablet Take 10 mg by mouth every morning., Starting 06/28/2014, Until Discontinued, Historical Med    Multiple Vitamins-Minerals (MULTIVITAMIN ADULT PO) Take 1 tablet by mouth daily., Until Discontinued, Historical Med    ibuprofen (ADVIL,MOTRIN) 600 MG tablet Take 600 mg by mouth every 6 (six) hours as needed for mild pain., Until Discontinued, Historical Med    oxyCODONE-acetaminophen (PERCOCET/ROXICET) 5-325 MG per tablet Take 1 tablet by mouth every 8 (eight) hours as needed for moderate pain or severe pain., Starting 01/21/2014, Until Discontinued, Print    Prenatal Vit-Fe Fumarate-FA (PRENATAL MULTIVITAMIN) TABS tablet Take 1 tablet by mouth daily at 12 noon., Starting 06/01/2013, Until Discontinued, Normal      STOP taking these medications     amoxicillin (AMOXIL) 500 MG capsule        Allergies  Allergen Reactions  . Codeine Nausea And Vomiting    Tylenol w/codeine makes her very sick.  . Other Itching    Burning Antibiotic for urinary tract infection:  Thinks cipro   Follow-up Information    Follow up with AVBUERE,EDWIN A, MD In 2 weeks.  Specialty:  Internal Medicine   Why:  please check CBC upon FU   Contact information:   [place] [address] [phone]       Follow up with Osborne Oman, MD On 07/09/2014.   Specialty:  Obstetrics and Gynecology   Why:  12:40pm   Contact information:   [place] [address] [phone]        The results of significant diagnostics from  this hospitalization (including imaging, microbiology, ancillary and laboratory) are listed below for reference.    Significant Diagnostic Studies: Dg Chest 2 View  07/02/2014   CLINICAL DATA:  Chest pain radiating to the right shoulder starting this morning.  EXAM: CHEST  2 VIEW  COMPARISON:  05/31/2013  FINDINGS: The heart size and mediastinal contours are within normal limits. Both lungs are clear. The visualized skeletal structures are unremarkable.  IMPRESSION: No active cardiopulmonary disease.   Electronically Signed   By: Van Clines M.D.   On: 07/02/2014 19:09   US Transvaginal Non-ob  07/03/2014   CLINICAL DATA:  Heavy menses.  Decreased hemoglobin.  EXAM: TRANSABDOMINAL AND TRANSVAGINAL ULTRASOUND OF PELVIS  TECHNIQUE: Both transabdominal and transvaginal ultrasound examinations of the pelvis were performed. Transabdominal technique was performed for global imaging of the pelvis including uterus, ovaries, adnexal regions, and pelvic cul-de-sac. It was necessary to proceed with endovaginal exam following the transabdominal exam to visualize the uterus and ovaries.  COMPARISON:  07/10/2013.  FINDINGS: Uterus  Measurements: 10.3 x 5.7 x 6.6 cm. Echogenic debris and small amount of fluid noted in the lower uterine segment canal. This could represent blood byproducts. Pregnancy byproducts could also present this fashion. Nabothian cyst noted.  Endometrium  Thickness: 15 mm.  No focal abnormality visualized.  Right ovary  Measurements: 3.0 x 2.0 x 2.5 cm. Normal appearance/no adnexal mass.  Left ovary  Measurements: 3.3 x 2.1 x 2.2 cm. Normal appearance/no adnexal mass.  Other findings  Trace free fluid in the cul-de-sac.  IMPRESSION: Small amount of fluid and echogenic debris noted in the lower uterine segment canal. Blood and/or pregnancy byproducts could present in this fashion. Trace free fluid in the cul-de-sac.   Electronically Signed   By: Marcello Moores  Register   On: 07/03/2014 07:11   US  Pelvis Complete  07/03/2014   CLINICAL DATA:  Heavy menses.  Decreased hemoglobin.  EXAM: TRANSABDOMINAL AND TRANSVAGINAL ULTRASOUND OF PELVIS  TECHNIQUE: Both transabdominal and transvaginal ultrasound examinations of the pelvis were performed. Transabdominal technique was performed for global imaging of the pelvis including uterus, ovaries, adnexal regions, and pelvic cul-de-sac. It was necessary to proceed with endovaginal exam following the transabdominal exam to visualize the uterus and ovaries.  COMPARISON:  07/10/2013.  FINDINGS: Uterus  Measurements: 10.3 x 5.7 x 6.6 cm. Echogenic debris and small amount of fluid noted in the lower uterine segment canal. This could represent blood byproducts. Pregnancy byproducts could also present this fashion. Nabothian cyst noted.  Endometrium  Thickness: 15 mm.  No focal abnormality visualized.  Right ovary  Measurements: 3.0 x 2.0 x 2.5 cm. Normal appearance/no adnexal mass.  Left ovary  Measurements: 3.3 x 2.1 x 2.2 cm. Normal appearance/no adnexal mass.  Other findings  Trace free fluid in the cul-de-sac.  IMPRESSION: Small amount of fluid and echogenic debris noted in the lower uterine segment canal. Blood and/or pregnancy byproducts could present in this fashion. Trace free fluid in the cul-de-sac.   Electronically Signed   By: Marcello Moores  Register  On: 07/03/2014 07:11    Microbiology: No results found for this or any previous visit (from the past 240 hour(s)).   Labs: Basic Metabolic Panel:  Recent Labs Lab 07/02/14 1627  NA 137  K 4.4  CL 109  CO2 21  GLUCOSE 138*  BUN 13  CREATININE 0.77  CALCIUM 9.0   Liver Function Tests:  Recent Labs Lab 07/02/14 1740  AST 16  ALT 9  ALKPHOS 54  BILITOT 0.3  PROT 6.9  ALBUMIN 4.0    Recent Labs Lab 07/02/14 2205  LIPASE 29   No results for input(s): AMMONIA in the last 168 hours. CBC:  Recent Labs Lab 07/02/14 1627  WBC 7.5  HGB 6.3*  HCT 23.2*  MCV 62.0*  PLT 247   Cardiac  Enzymes:  Recent Labs Lab 07/02/14 2205 07/03/14 0545  TROPONINI <0.03 <0.03   BNP: BNP (last 3 results) No results for input(s): BNP in the last 8760 hours.  ProBNP (last 3 results) No results for input(s): PROBNP in the last 8760 hours.  CBG:  Recent Labs Lab 07/03/14 0638  GLUCAP 99       Signed:  Sharlotte Baka  [facility] 07/03/2014, 3:51 PM

## 2014-07-04 LAB — TYPE AND SCREEN
ABO/RH(D): A POS
Antibody Screen: NEGATIVE
UNIT DIVISION: 0
Unit division: 0

## 2014-07-04 LAB — HAPTOGLOBIN: HAPTOGLOBIN: 94 mg/dL (ref 34–200)

## 2014-07-05 LAB — FACTOR 8 ASSAY: COAGULATION FACTOR VIII: 236 % — AB (ref 73–140)

## 2014-07-05 LAB — FACTOR 10 ASSAY: Factor X Activity: 73 % (ref 72–134)

## 2014-07-05 LAB — FACTOR 9 ASSAY: Coagulation Factor IX: 127 % (ref 75–134)

## 2014-07-05 LAB — VON WILLEBRAND ANTIGEN: VON WILLEBRAND ANTIGEN, PLASMA: 146 % (ref 50–217)

## 2014-07-09 ENCOUNTER — Other Ambulatory Visit (HOSPITAL_COMMUNITY)
Admission: RE | Admit: 2014-07-09 | Discharge: 2014-07-09 | Disposition: A | Discharge status: Home / Self Care | Payer: Medicaid Other | Source: Ambulatory Visit | Attending: Obstetrics & Gynecology | Admitting: Obstetrics & Gynecology

## 2014-07-09 ENCOUNTER — Encounter: Payer: Self-pay | Admitting: Obstetrics & Gynecology

## 2014-07-09 ENCOUNTER — Ambulatory Visit (INDEPENDENT_AMBULATORY_CARE_PROVIDER_SITE_OTHER): Payer: Medicaid Other | Admitting: Obstetrics & Gynecology

## 2014-07-09 VITALS — BP 105/59 | HR 65 | Temp 98.8°F | Ht 68.0 in | Wt 229.7 lb

## 2014-07-09 DIAGNOSIS — N939 Abnormal uterine and vaginal bleeding, unspecified: Secondary | ICD-10-CM | POA: Diagnosis present

## 2014-07-09 DIAGNOSIS — N920 Excessive and frequent menstruation with regular cycle: Secondary | ICD-10-CM | POA: Diagnosis not present

## 2014-07-09 LAB — POCT PREGNANCY, URINE: Preg Test, Ur: NEGATIVE

## 2014-07-09 MED ORDER — TRANEXAMIC ACID 650 MG PO TABS: 1300.0000 mg | ORAL_TABLET | Freq: Three times a day (TID) | ORAL | Status: DC

## 2014-07-09 NOTE — Patient Instructions (Signed)
Dysfunctional Uterine Bleeding Normally, menstrual periods begin between ages 11 to 17 in young women. A normal menstrual cycle/period may begin every 23 days up to 35 days and lasts from 1 to 7 days. Around 12 to 14 days before your menstrual period starts, ovulation (ovary produces an egg) occurs. When counting the time between menstrual periods, count from the first day of bleeding of the previous period to the first day of bleeding of the next period. Dysfunctional (abnormal) uterine bleeding is bleeding that is different from a normal menstrual period. Your periods may come earlier or later than usual. They may be lighter, have blood clots or be heavier. You may have bleeding between periods, or you may skip one period or more. You may have bleeding after sexual intercourse, bleeding after menopause, or no menstrual period. CAUSES   Pregnancy (normal, miscarriage, tubal).  IUDs (intrauterine device, birth control).  Birth control pills.  Hormone treatment.  Menopause.  Infection of the cervix.  Blood clotting problems.  Infection of the inside lining of the uterus.  Endometriosis, inside lining of the uterus growing in the pelvis and other female organs.  Adhesions (scar tissue) inside the uterus.  Obesity or severe weight loss.  Uterine polyps inside the uterus.  Cancer of the vagina, cervix, or uterus.  Ovarian cysts or polycystic ovary syndrome.  Medical problems (diabetes, thyroid disease).  Uterine fibroids (noncancerous tumor).  Problems with your female hormones.  Endometrial hyperplasia, very thick lining and enlarged cells inside of the uterus.  Medicines that interfere with ovulation.  Radiation to the pelvis or abdomen.  Chemotherapy. DIAGNOSIS   Your doctor will discuss the history of your menstrual periods, medicines you are taking, changes in your weight, stress in your life, and any medical problems you may have.  Your doctor will do a physical  and pelvic examination.  Your doctor may want to perform certain tests to make a diagnosis, such as:  Pap test.  Blood tests.  Cultures for infection.  CT scan.  Ultrasound.  Hysteroscopy.  Laparoscopy.  MRI.  Hysterosalpingography.  D and C.  Endometrial biopsy. TREATMENT  Treatment will depend on the cause of the dysfunctional uterine bleeding (DUB). Treatment may include:  Observing your menstrual periods for a couple of months.  Prescribing medicines for medical problems, including:  Antibiotics.  Hormones.  Birth control pills.  Removing an IUD (intrauterine device, birth control).  Surgery:  D and C (scrape and remove tissue from inside the uterus).  Laparoscopy (examine inside the abdomen with a lighted tube).  Uterine ablation (destroy lining of the uterus with electrical current, laser, heat, or freezing).  Hysteroscopy (examine cervix and uterus with a lighted tube).  Hysterectomy (remove the uterus). HOME CARE INSTRUCTIONS   If medicines were prescribed, take exactly as directed. Do not change or switch medicines without consulting your caregiver.  Long term heavy bleeding may result in iron deficiency. Your caregiver may have prescribed iron pills. They help replace the iron that your body lost from heavy bleeding. Take exactly as directed.  Do not take aspirin or medicines that contain aspirin one week before or during your menstrual period. Aspirin may make the bleeding worse.  If you need to change your sanitary pad or tampon more than once every 2 hours, stay in bed with your feet elevated and a cold pack on your lower abdomen. Rest as much as possible, until the bleeding stops or slows down.  Eat well-balanced meals. Eat foods high in iron. Examples   are:  Leafy green vegetables.  Whole-grain breads and cereals.  Eggs.  Meat.  Liver.  Do not try to lose weight until the abnormal bleeding has stopped and your blood iron level is  back to normal. Do not lift more than ten pounds or do strenuous activities when you are bleeding.  For a couple of months, make note on your calendar, marking the start and ending of your period, and the type of bleeding (light, medium, heavy, spotting, clots or missed periods). This is for your caregiver to better evaluate your problem. SEEK MEDICAL CARE IF:   You develop nausea (feeling sick to your stomach) and vomiting, dizziness, or diarrhea while you are taking your medicine.  You are getting lightheaded or weak.  You have any problems that may be related to the medicine you are taking.  You develop pain with your DUB.  You want to remove your IUD.  You want to stop or change your birth control pills or hormones.  You have any type of abnormal bleeding mentioned above.  You are over 16 years old and have not had a menstrual period yet.  You are 39 years old and you are still having menstrual periods.  You have any of the symptoms mentioned above.  You develop a rash. SEEK IMMEDIATE MEDICAL CARE IF:   An oral temperature above 102 F (38.9 C) develops.  You develop chills.  You are changing your sanitary pad or tampon more than once an hour.  You develop abdominal pain.  You pass out or faint. Document Released: 03/26/2000 Document Revised: 06/21/2011 Document Reviewed: 02/25/2009 [employer] Patient Information 2015 [employer]. This information is not intended to replace advice given to you by your health care provider. Make sure you discuss any questions you have with your health care provider.  

## 2014-07-09 NOTE — Progress Notes (Signed)
CLINIC ENCOUNTER NOTE  History:  39 y.o. [id number] here today for evaluation of menorrhagia. Reports having menorrhagia for several years, has to use 10-11 pads per day for first five days of every period.  Occasionally accompanied by cramping.  As a result of menorrhagia, she has symptomatic anemia which can sometimes require transfusion; she reports having 3-4 transfusions per year. Her last admission was on 07/02/14, she received 2 units of pRBCs and started on iron therapy.  She is currently on her period, had heavier bleeding, now having normal amount.  The following portions of the patient's history were reviewed and updated as appropriate: allergies, current medications, past family history, past medical history, past social history, past surgical history and problem list. Normal pap and negative HRHPV on 06/22/13.    Review of Systems:  A comprehensive review of systems was negative except for: Constitutional: positive for fatigue Cardiovascular: positive for increased heart rate when she is anemic Comprehensive review of systems was otherwise negative.  Objective:  Physical Exam BP 105/59 mmHg  Pulse 65  Temp(Src) 98.8 F (37.1 C) (Oral)  Ht 5\' 8"  (1.727 m)  Wt 229 lb 11.2 oz (104.191 kg)  BMI 34.93 kg/m2  LMP 06/12/2014 General: No acute distress. Normocephalic, atraumatic, sclerae anicteric.  Psych: A&O x 3, stable mood Cardiovascular: Normal heart rate noted Respiratory: No problems with respiration noted Abdomen: Soft, nontender and nondistended Pelvic: Normal appearing external genitalia; normal appearing vaginal mucosa and cervix.  Moderate blood in vault.  Small uterus, no other palpable masses, no uterine or adnexal tenderness  ENDOMETRIAL BIOPSY     The indications for endometrial biopsy were reviewed.   Risks of the biopsy including cramping, bleeding, infection, uterine perforation, inadequate specimen and need for additional procedures  were discussed. The patient  states she understands and agrees to undergo procedure today. Consent was signed. Time out was performed. Urine HCG was negative. During the pelvic exam, the cervix was prepped with Betadine. A single-toothed tenaculum was placed on the anterior lip of the cervix to stabilize it. The 3 mm pipelle was introduced into the endometrial cavity without difficulty to a depth of 8cm, multiple passes were made, and a moderate amount of tissue was obtained and sent to pathology. The instruments were removed from the patient's vagina. Minimal bleeding from the cervix was noted. The patient tolerated the procedure well. Routine post-procedure instructions were given to the patient.      Labs and Imaging  CBC Latest Ref Rng 07/02/2014 10/23/2013 10/10/2013  WBC 4.0 - 10.5 K/uL 7.5 5.8 6.9  Hemoglobin 12.0 - 15.0 g/dL 6.3(LL) 9.6(L) 9.6(L)  Hematocrit 36.0 - 46.0 % 23.2(L) 32.4(L) 32.6(L)  Platelets 150 - 400 K/uL 247 318 280    07/03/2014   CLINICAL DATA:  Heavy menses.  Decreased hemoglobin.  EXAM: TRANSABDOMINAL AND TRANSVAGINAL ULTRASOUND OF PELVIS  TECHNIQUE: Both transabdominal and transvaginal ultrasound examinations of the pelvis were performed. Transabdominal technique was performed for global imaging of the pelvis including uterus, ovaries, adnexal regions, and pelvic cul-de-sac. It was necessary to proceed with endovaginal exam following the transabdominal exam to visualize the uterus and ovaries.  COMPARISON:  07/10/2013.  FINDINGS: Uterus  Measurements: 10.3 x 5.7 x 6.6 cm. Echogenic debris and small amount of fluid noted in the lower uterine segment canal. This could represent blood byproducts. Pregnancy byproducts could also present this fashion. Nabothian cyst noted.  Endometrium  Thickness: 15 mm.  No focal abnormality visualized.  Right ovary  Measurements: 3.0 x 2.0  x 2.5 cm. Normal appearance/no adnexal mass.  Left ovary  Measurements: 3.3 x 2.1 x 2.2 cm. Normal appearance/no adnexal mass.  Other  findings  Trace free fluid in the cul-de-sac.  IMPRESSION: Small amount of fluid and echogenic debris noted in the lower uterine segment canal. Blood and/or pregnancy byproducts could present in this fashion. Trace free fluid in the cul-de-sac.   Electronically Signed   By: Marcello Moores  Register   On: 07/03/2014 07:11    Assessment & Plan:   Will follow up endometrial biopsy results  Discussed management options for abnormal uterine bleeding including Lysteda,  oral progesterone, Depo Provera, Mirena IUD, endometrial ablation (Novasure/Hydrothermal Ablation) or hysterectomy as definitive surgical management.  Discussed risks and benefits of each method.   Patient desires Marshell Levan for now, prescription written.  Printed patient education handouts were given to the patient to review at home.  Bleeding precautions reviewed.   Routine preventative health maintenance measures emphasized.   Verita Schneiders, MD, [place] Attending [place] for [employer], [place]

## 2014-07-10 ENCOUNTER — Encounter: Payer: Self-pay | Admitting: Obstetrics & Gynecology

## 2014-07-10 ENCOUNTER — Telehealth: Payer: Self-pay | Admitting: *Deleted

## 2014-07-10 NOTE — Telephone Encounter (Signed)
Called patient and informed her of results. Patient verbalized understanding and states she would like a follow up appt with Dr Harolyn Rutherford to discuss options. Told patient I will send a message to our front office staff and they will be in touch with you for a follow up appt. Patient verbalized understanding and had no other questions

## 2014-07-10 NOTE — Telephone Encounter (Signed)
Called patient and left message to call us back for results.

## 2014-07-10 NOTE — Telephone Encounter (Signed)
-----   Message from Osborne Oman, MD sent at 07/10/2014 12:18 PM EDT ----- Benign endometrial biopsy. Please call to inform patient of results.

## 2014-08-07 ENCOUNTER — Ambulatory Visit (INDEPENDENT_AMBULATORY_CARE_PROVIDER_SITE_OTHER): Payer: Medicaid Other | Admitting: Obstetrics & Gynecology

## 2014-08-07 ENCOUNTER — Encounter: Payer: Self-pay | Admitting: Obstetrics & Gynecology

## 2014-08-07 VITALS — BP 119/77 | HR 72 | Temp 98.1°F | Wt 232.1 lb

## 2014-08-07 DIAGNOSIS — N939 Abnormal uterine and vaginal bleeding, unspecified: Secondary | ICD-10-CM

## 2014-08-07 LAB — TSH: TSH: 1.224 u[IU]/mL (ref 0.350–4.500)

## 2014-08-07 MED ORDER — MEGESTROL ACETATE 40 MG PO TABS: 40.0000 mg | ORAL_TABLET | Freq: Two times a day (BID) | ORAL | Status: DC

## 2014-08-07 NOTE — Progress Notes (Signed)
Patient ID: Carrie Douglas, female   DOB: 1976-03-02, 39 y.o.   MRN: [id number] Pt had extreme heavy period 4/10-4/26. Pt had severe cramping with heavy bleeding with clots.

## 2014-08-07 NOTE — Progress Notes (Signed)
CLINIC ENCOUNTER NOTE  History:  39 y.o. [id number] here today for continued management of menorrhagia. Was prescribed Lysteda during her last visit during which she had a benign endometrial biopsy, reports that this did not help her bleeding at all.  Reports having very heavy bleeding and passing clots; this occurred twice in the last month lasting about seven days per episode.  Associated with cramping and occasional lightheadedness. Of note, she had a transfusion on 07/02/14 for hemoglobin of 6.3.  She now desires a hysterectomy for definitive management. No current bleeding.  Past Medical History  Diagnosis Date  . Rubella     5 months old  . Varicella     10 1/40 years old  . Anxiety   . Depression   . Vaginal delivery 1997, 1999, 2002, 2006, 2008, 2012  . Anemia     blood transfusion 09/14/10  . Iron deficiency anemia   . 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 Arthritis     "knees" (07/02/2014)    Past Surgical History  Procedure Laterality Date  . Roux-en-y gastric bypass  2004  . Dilation and evacuation N/A 07/13/2013    Procedure: DILATATION AND EVACUATION;  Surgeon: Woodroe Mode, MD;  Location: [facility];  Service: Gynecology;  Laterality: N/A;  . Laparoscopic bilateral salpingectomy Bilateral 10/10/2013    Procedure: LAPAROSCOPIC BILATERAL SALPINGECTOMY;  Surgeon: Osborne Oman, MD;  Location: [facility];  Service: Gynecology;  Laterality: Bilateral;  with single site port  . Cholecystectomy open  2007  . Salpingoophorectomy Bilateral 08/2013  . Dilation and curettage of uterus  07/2013    "not a D&E"    The following portions of the patient's history were reviewed and updated as appropriate: allergies, current medications, past family history, past medical history, past social history, past surgical history and problem list.   Health Maintenance:  Normal pap and negative HRHPV on 06/22/13.   Review of  Systems:  A comprehensive review of systems was negative except for: Constitutional: positive for fatigue Cardiovascular: positive for increased heart rate when she is anemic  Objective:  Physical Exam BP 119/77 mmHg  Pulse 72  Temp(Src) 98.1 F (36.7 C)  Wt 232 lb 1.6 oz (105.28 kg)  LMP 07/21/2014 CONSTITUTIONAL: Well-developed, well-nourished female in no acute distress.  HENT:  Normocephalic, atraumatic, External right and left ear normal. Oropharynx is clear and moist EYES: Conjunctivae and EOM are normal. Pupils are equal, round, and reactive to light. No scleral icterus.  NECK: Normal range of motion, supple, no masses SKIN: Skin is warm and dry. No rash noted. Not diaphoretic. No erythema. No pallor. [place]: Alert and oriented to person, place, and time. Normal reflexes, muscle tone coordination. No cranial nerve deficit noted. PSYCHIATRIC: Normal mood and affect. Normal behavior. Normal judgment and thought content. CARDIOVASCULAR: Normal heart rate noted, regular rhythm RESPIRATORY: Effort and breath sounds normal, no problems with respiration noted ABDOMEN: Soft, no distention noted.   PELVIC: Deferred MUSCULOSKELETAL: Normal range of motion. No edema and no tenderness.  Labs and Imaging CBC Latest Ref Rng 07/02/2014 10/23/2013 10/10/2013  WBC 4.0 - 10.5 K/uL 7.5 5.8 6.9  Hemoglobin 12.0 - 15.0 g/dL 6.3(LL) 9.6(L) 9.6(L)  Hematocrit 36.0 - 46.0 % 23.2(L) 32.4(L) 32.6(L)  Platelets 150 - 400 K/uL 247 318 280    07/03/2014 CLINICAL DATA: Heavy menses. Decreased hemoglobin. EXAM: TRANSABDOMINAL AND TRANSVAGINAL ULTRASOUND OF PELVIS TECHNIQUE: Both transabdominal  and transvaginal ultrasound examinations of the pelvis were performed. Transabdominal technique was performed for global imaging of the pelvis including uterus, ovaries, adnexal regions, and pelvic cul-de-sac. It was necessary to proceed with endovaginal exam following the transabdominal  exam to visualize the uterus and ovaries. COMPARISON: 07/10/2013. FINDINGS: Uterus Measurements: 10.3 x 5.7 x 6.6 cm. Echogenic debris and small amount of fluid noted in the lower uterine segment canal. This could represent blood byproducts. Pregnancy byproducts could also present this fashion. Nabothian cyst noted. Endometrium Thickness: 15 mm. No focal abnormality visualized. Right ovary Measurements: 3.0 x 2.0 x 2.5 cm. Normal appearance/no adnexal mass. Left ovary Measurements: 3.3 x 2.1 x 2.2 cm. Normal appearance/no adnexal mass. Other findings Trace free fluid in the cul-de-sac. IMPRESSION: Small amount of fluid and echogenic debris noted in the lower uterine segment canal. Blood and/or pregnancy byproducts could present in this fashion. Trace free fluid in the cul-de-sac. Electronically Signed By: Marcello Moores Register  On: 07/03/2014 07:11    07/09/2014 Benign endometrial biopsy     Assessment & Plan:  Patient desires definitive management with hysterectomy.  I proposed doing a laparoscopic-assisted vaginal hysterectomy (LAVH), given adhesions noted during her bilateral salpingectomy on 10/10/2013. She has had SVD x 6.  No indication for oophorectomy.  Patient agrees with this proposed surgery.  The risks of surgery were discussed in detail with the patient including but not limited to: bleeding which may require transfusion or reoperation; infection which may require antibiotics; injury to bowel, bladder, ureters or other surrounding organs; need for additional procedures including laparotomy; thromboembolic phenomenon, incisional problems and other postoperative/anesthesia complications.  Patient was also advised that she will remain in house for 1 night; and expected recovery time after a hysterectomy is 6-8 weeks.  Likelihood of success in alleviating the patient's symptoms was discussed.   She was told that she will be contacted by our surgical scheduler regarding the time and date  of her surgery; routine preoperative instructions of having nothing to eat or drink after midnight on the day prior to surgery and also coming to the hospital 1 1/2 hours prior to her time of surgery were also emphasized.  She was told she may be called for a preoperative appointment about a week prior to surgery and will be given further preoperative instructions at that visit.  Routine postoperative instructions will be reviewed with the patient and her family in detail after surgery.  In the meantime, she was prescribed Megace 40 mg twice daily until surgery; bleeding precautions were reviewed. Printed patient education handouts about the procedure was given to the patient to review at home.  CBC and TSH checked today, will follow up results and manage accordingly.  Routine preventative health maintenance measures emphasized.   Total face-to-face time with patient: 25 minutes. Over 50% of encounter was spent on counseling and coordination of care.   Verita Schneiders, MD, [place] Attending [place] for [employer], [place]

## 2014-08-07 NOTE — Patient Instructions (Signed)
Laparoscopically Assisted Vaginal Hysterectomy  A laparoscopically assisted vaginal hysterectomy (LAVH) is a surgical procedure to remove the uterus and cervix, and sometimes the ovaries and fallopian tubes. During an LAVH, some of the surgical removal is done through the vagina, and the rest is done through a few small surgical cuts (incisions) in the abdomen.  This procedure is usually considered in women when a vaginal hysterectomy is not an option. Your health care provider will discuss the risks and benefits of the different surgical techniques at your appointment. Generally, recovery time is faster and there are fewer complications after laparoscopic procedures than after open incisional procedures. LET YOUR HEALTH CARE PROVIDER KNOW ABOUT:   Any allergies you have.  All medicines you are taking, including vitamins, herbs, eye drops, creams, and over-the-counter medicines.  Previous problems you or members of your family have had with the use of anesthetics.  Any blood disorders you have.  Previous surgeries you have had.  Medical conditions you have. RISKS AND COMPLICATIONS Generally, this is a safe procedure. However, as with any procedure, complications can occur. Possible complications include:  Allergies to medicines.  Difficulty breathing.  Bleeding.  Infection.  Damage to other structures near your uterus and cervix. BEFORE THE PROCEDURE  Ask your health care provider about changing or stopping your regular medicines.  Take certain medicines, such as a colon-emptying preparation, as directed.  Do not eat or drink anything for at least 8 hours before your surgery.  Stop smoking if you smoke. Stopping will improve your health after surgery.  Arrange for a ride home after surgery and for help at home during recovery. PROCEDURE   An IV tube will be put into one of your veins in order to give you fluids and medicines.  You will receive medicines to relax you and  medicines that make you sleep (general anesthetic).  You may have a flexible tube (catheter) put into your bladder to drain urine.  You may have a tube put through your nose or mouth that goes into your stomach (nasogastric tube). The nasogastric tube removes digestive fluids and prevents you from feeling nauseated and from vomiting.  Tight-fitting (compression) stockings will be placed on your legs to promote circulation.  Three to four small incisions will be made in your abdomen. An incision also will be made in your vagina. Probes and tools will be inserted into the small incisions. The uterus and cervix are removed (and possibly your ovaries and fallopian tubes) through your vagina as well as through the small incisions that were made in the abdomen.  Your vagina is then sewn back to normal. AFTER THE PROCEDURE  You may have a liquid diet temporarily. You will most likely return to, and tolerate, your usual diet the day after surgery.  You will be passing urine through a catheter. It will be removed the day after surgery.  Your temperature, breathing rate, heart rate, blood pressure, and oxygen level will be monitored regularly.  You will still wear compression stockings on your legs until you are able to move around.  You will use a special device or do breathing exercises to keep your lungs clear.  You will be encouraged to walk as soon as possible. Document Released: 03/18/2011 Document Revised: 11/29/2012 Document Reviewed: 10/12/2012 ExitCare Patient Information 2015 [employer]. This information is not intended to replace advice given to you by your health care provider. Make sure you discuss any questions you have with your health care provider.  

## 2014-08-08 LAB — CBC
HEMATOCRIT: 31.1 % — AB (ref 36.0–46.0)
Hemoglobin: 9 g/dL — ABNORMAL LOW (ref 12.0–15.0)
MCH: 20.4 pg — ABNORMAL LOW (ref 26.0–34.0)
MCHC: 28.9 g/dL — ABNORMAL LOW (ref 30.0–36.0)
MCV: 70.4 fL — ABNORMAL LOW (ref 78.0–100.0)
PLATELETS: 256 10*3/uL (ref 150–400)
RBC: 4.42 MIL/uL (ref 3.87–5.11)
RDW: 24.7 % — ABNORMAL HIGH (ref 11.5–15.5)
WBC: 6.7 10*3/uL (ref 4.0–10.5)

## 2014-09-12 NOTE — Patient Instructions (Addendum)
   Your procedure is scheduled on:  Tuesday, June 7  Enter through the [employer] [facility] at: 11:30 AM Pick up the phone at the desk and dial [phone] and inform us of your arrival.  Please call this number if you have any problems the morning of surgery: [phone]  Remember: Do not eat food after midnight: Monday Do not drink clear liquids after: 9 AM Tuesday, Day of Surgery   Do not wear jewelry, make-up, or FINGER nail polish No metal in your hair or on your body. Do not wear lotions, powders, perfumes.  You may wear deodorant.  Do not bring valuables to the hospital. Contacts, dentures or bridgework may not be worn into surgery.  Leave suitcase in the car. After Surgery it may be brought to your room. For patients being admitted to the hospital, checkout time is 11:00am the day of discharge.

## 2014-09-13 ENCOUNTER — Encounter (HOSPITAL_COMMUNITY): Payer: Self-pay

## 2014-09-13 ENCOUNTER — Encounter (HOSPITAL_COMMUNITY)
Admission: RE | Admit: 2014-09-13 | Discharge: 2014-09-13 | Disposition: A | Discharge status: Home / Self Care | Payer: Medicaid Other | Source: Ambulatory Visit | Attending: Obstetrics & Gynecology | Admitting: Obstetrics & Gynecology

## 2014-09-13 DIAGNOSIS — Z01818 Encounter for other preprocedural examination: Secondary | ICD-10-CM | POA: Insufficient documentation

## 2014-09-13 DIAGNOSIS — N920 Excessive and frequent menstruation with regular cycle: Secondary | ICD-10-CM | POA: Diagnosis not present

## 2014-09-13 DIAGNOSIS — D649 Anemia, unspecified: Secondary | ICD-10-CM | POA: Diagnosis not present

## 2014-09-13 HISTORY — DX: Sleep apnea, unspecified: G47.30

## 2014-09-13 LAB — TYPE AND SCREEN
ABO/RH(D): A POS
ANTIBODY SCREEN: NEGATIVE

## 2014-09-13 LAB — CBC
HCT: 31.3 % — ABNORMAL LOW (ref 36.0–46.0)
Hemoglobin: 9.2 g/dL — ABNORMAL LOW (ref 12.0–15.0)
MCH: 20.5 pg — AB (ref 26.0–34.0)
MCHC: 29.4 g/dL — ABNORMAL LOW (ref 30.0–36.0)
MCV: 69.7 fL — ABNORMAL LOW (ref 78.0–100.0)
PLATELETS: 293 10*3/uL (ref 150–400)
RBC: 4.49 MIL/uL (ref 3.87–5.11)
RDW: 18.5 % — ABNORMAL HIGH (ref 11.5–15.5)
WBC: 7 10*3/uL (ref 4.0–10.5)

## 2014-09-14 NOTE — Anesthesia Preprocedure Evaluation (Addendum)
Anesthesia Evaluation  Patient identified by MRN, date of birth, ID band Patient awake    Reviewed: Allergy & Precautions, NPO status , Patient's Chart, lab work & pertinent test results, reviewed documented beta blocker date and time   Airway Mallampati: II   Neck ROM: Full    Dental  (+) Dental Advisory Given, Teeth Intact   Pulmonary sleep apnea , Current Smoker,  breath sounds clear to auscultation        Cardiovascular Rhythm:Regular  EKG NSR   Neuro/Psych Anxiety Depression    GI/Hepatic negative GI ROS, Neg liver ROS,   Endo/Other    Renal/GU negative Renal ROS     Musculoskeletal  (+) Arthritis -,   Abdominal (+)  Abdomen: soft.    Peds  Hematology  (+) anemia , 9/30   Anesthesia Other Findings   Reproductive/Obstetrics                           Anesthesia Physical Anesthesia Plan  ASA: II  Anesthesia Plan: General   Post-op Pain Management:    Induction: Intravenous  Airway Management Planned: Oral ETT  Additional Equipment:   Intra-op Plan:   Post-operative Plan: Extubation in OR  Informed Consent: I have reviewed the patients History and Physical, chart, labs and discussed the procedure including the risks, benefits and alternatives for the proposed anesthesia with the patient or authorized representative who has indicated his/her understanding and acceptance.     Plan Discussed with:   Anesthesia Plan Comments: (Multimodal pain RX, verify T&S)        Anesthesia Quick Evaluation

## 2014-09-16 MED ORDER — DEXTROSE 5 % IV SOLN
2.0000 g | INTRAVENOUS | Status: AC
  Administered 2014-09-17: 2 g via INTRAVENOUS
  Filled 2014-09-16: qty 2

## 2014-09-17 ENCOUNTER — Ambulatory Visit (HOSPITAL_COMMUNITY): Payer: Medicaid Other | Admitting: Anesthesiology

## 2014-09-17 ENCOUNTER — Encounter (HOSPITAL_COMMUNITY)
Admission: RE | Disposition: A | Discharge status: Home / Self Care | Payer: Self-pay | Source: Ambulatory Visit | Attending: Obstetrics & Gynecology

## 2014-09-17 ENCOUNTER — Encounter (HOSPITAL_COMMUNITY): Payer: Self-pay

## 2014-09-17 ENCOUNTER — Observation Stay (HOSPITAL_COMMUNITY)
Admission: RE | Admit: 2014-09-17 | Discharge: 2014-09-18 | Disposition: A | Discharge status: Home / Self Care | Payer: Medicaid Other | Source: Ambulatory Visit | Attending: Obstetrics & Gynecology | Admitting: Obstetrics & Gynecology

## 2014-09-17 DIAGNOSIS — N939 Abnormal uterine and vaginal bleeding, unspecified: Secondary | ICD-10-CM

## 2014-09-17 DIAGNOSIS — D509 Iron deficiency anemia, unspecified: Secondary | ICD-10-CM | POA: Diagnosis not present

## 2014-09-17 DIAGNOSIS — Z90710 Acquired absence of both cervix and uterus: Secondary | ICD-10-CM | POA: Diagnosis present

## 2014-09-17 DIAGNOSIS — Z885 Allergy status to narcotic agent status: Secondary | ICD-10-CM | POA: Diagnosis not present

## 2014-09-17 DIAGNOSIS — G4730 Sleep apnea, unspecified: Secondary | ICD-10-CM | POA: Diagnosis not present

## 2014-09-17 DIAGNOSIS — F17210 Nicotine dependence, cigarettes, uncomplicated: Secondary | ICD-10-CM | POA: Insufficient documentation

## 2014-09-17 DIAGNOSIS — F419 Anxiety disorder, unspecified: Secondary | ICD-10-CM | POA: Insufficient documentation

## 2014-09-17 DIAGNOSIS — Z79899 Other long term (current) drug therapy: Secondary | ICD-10-CM | POA: Diagnosis not present

## 2014-09-17 DIAGNOSIS — F329 Major depressive disorder, single episode, unspecified: Secondary | ICD-10-CM | POA: Insufficient documentation

## 2014-09-17 DIAGNOSIS — D259 Leiomyoma of uterus, unspecified: Principal | ICD-10-CM | POA: Insufficient documentation

## 2014-09-17 DIAGNOSIS — Z9884 Bariatric surgery status: Secondary | ICD-10-CM | POA: Insufficient documentation

## 2014-09-17 DIAGNOSIS — M179 Osteoarthritis of knee, unspecified: Secondary | ICD-10-CM | POA: Insufficient documentation

## 2014-09-17 DIAGNOSIS — N920 Excessive and frequent menstruation with regular cycle: Secondary | ICD-10-CM | POA: Insufficient documentation

## 2014-09-17 HISTORY — PX: LAPAROSCOPIC ASSISTED VAGINAL HYSTERECTOMY: SHX5398

## 2014-09-17 LAB — TYPE AND SCREEN
ABO/RH(D): A POS
Antibody Screen: NEGATIVE

## 2014-09-17 LAB — PREGNANCY, URINE: PREG TEST UR: NEGATIVE

## 2014-09-17 SURGERY — HYSTERECTOMY, VAGINAL, LAPAROSCOPY-ASSISTED
Anesthesia: General | Site: Vagina

## 2014-09-17 MED ORDER — ONDANSETRON HCL 4 MG/2ML IJ SOLN
INTRAMUSCULAR | Status: AC
  Filled 2014-09-17: qty 2

## 2014-09-17 MED ORDER — BUPIVACAINE HCL (PF) 0.5 % IJ SOLN
INTRAMUSCULAR | Status: AC
  Filled 2014-09-17: qty 30

## 2014-09-17 MED ORDER — ROCURONIUM BROMIDE 100 MG/10ML IV SOLN
INTRAVENOUS | Status: AC
  Filled 2014-09-17: qty 1

## 2014-09-17 MED ORDER — GLYCOPYRROLATE 0.2 MG/ML IJ SOLN
INTRAMUSCULAR | Status: DC | PRN
  Administered 2014-09-17 (×2): 0.2 mg via INTRAVENOUS

## 2014-09-17 MED ORDER — PROMETHAZINE HCL 25 MG/ML IJ SOLN
6.2500 mg | INTRAMUSCULAR | Status: DC | PRN
Start: 2014-09-17 — End: 2014-09-17

## 2014-09-17 MED ORDER — PHENYLEPHRINE HCL 10 MG/ML IJ SOLN
INTRAMUSCULAR | Status: DC | PRN
  Administered 2014-09-17: 80 ug via INTRAVENOUS
  Administered 2014-09-17: 40 ug via INTRAVENOUS

## 2014-09-17 MED ORDER — PHENYLEPHRINE 40 MCG/ML (10ML) SYRINGE FOR IV PUSH (FOR BLOOD PRESSURE SUPPORT)
PREFILLED_SYRINGE | INTRAVENOUS | Status: AC
  Filled 2014-09-17: qty 10

## 2014-09-17 MED ORDER — DEXAMETHASONE SODIUM PHOSPHATE 10 MG/ML IJ SOLN
INTRAMUSCULAR | Status: DC | PRN
  Administered 2014-09-17: 4 mg via INTRAVENOUS

## 2014-09-17 MED ORDER — ONDANSETRON HCL 4 MG/2ML IJ SOLN
INTRAMUSCULAR | Status: DC | PRN
  Administered 2014-09-17: 4 mg via INTRAVENOUS

## 2014-09-17 MED ORDER — LACTATED RINGERS IV SOLN: INTRAVENOUS | Status: DC

## 2014-09-17 MED ORDER — IBUPROFEN 600 MG PO TABS
600.0000 mg | ORAL_TABLET | Freq: Four times a day (QID) | ORAL | Status: DC | PRN
  Administered 2014-09-17 – 2014-09-18 (×2): 600 mg via ORAL
  Filled 2014-09-17 (×3): qty 1

## 2014-09-17 MED ORDER — DEXAMETHASONE SODIUM PHOSPHATE 4 MG/ML IJ SOLN
INTRAMUSCULAR | Status: AC
  Filled 2014-09-17: qty 1

## 2014-09-17 MED ORDER — FENTANYL CITRATE (PF) 250 MCG/5ML IJ SOLN
INTRAMUSCULAR | Status: AC
  Filled 2014-09-17: qty 5

## 2014-09-17 MED ORDER — KETOROLAC TROMETHAMINE 30 MG/ML IJ SOLN
30.0000 mg | Freq: Once | INTRAMUSCULAR | Status: AC
  Administered 2014-09-17: 30 mg via INTRAVENOUS

## 2014-09-17 MED ORDER — LIDOCAINE HCL (CARDIAC) 20 MG/ML IV SOLN
INTRAVENOUS | Status: DC | PRN
  Administered 2014-09-17: 80 mg via INTRAVENOUS

## 2014-09-17 MED ORDER — ONDANSETRON HCL 4 MG/2ML IJ SOLN: 4.0000 mg | Freq: Four times a day (QID) | INTRAMUSCULAR | Status: DC | PRN

## 2014-09-17 MED ORDER — FENTANYL CITRATE (PF) 100 MCG/2ML IJ SOLN
INTRAMUSCULAR | Status: DC | PRN
  Administered 2014-09-17: 100 ug via INTRAVENOUS
  Administered 2014-09-17: 25 ug via INTRAVENOUS
  Administered 2014-09-17: 100 ug via INTRAVENOUS
  Administered 2014-09-17 (×2): 50 ug via INTRAVENOUS
  Administered 2014-09-17: 25 ug via INTRAVENOUS

## 2014-09-17 MED ORDER — PROPOFOL 10 MG/ML IV BOLUS
INTRAVENOUS | Status: AC
  Filled 2014-09-17: qty 20

## 2014-09-17 MED ORDER — GLYCOPYRROLATE 0.2 MG/ML IJ SOLN
INTRAMUSCULAR | Status: AC
  Filled 2014-09-17: qty 1

## 2014-09-17 MED ORDER — ROCURONIUM BROMIDE 100 MG/10ML IV SOLN
INTRAVENOUS | Status: DC | PRN
  Administered 2014-09-17: 40 mg via INTRAVENOUS
  Administered 2014-09-17: 10 mg via INTRAVENOUS

## 2014-09-17 MED ORDER — NEOSTIGMINE METHYLSULFATE 10 MG/10ML IV SOLN
INTRAVENOUS | Status: DC | PRN
  Administered 2014-09-17: 2 mg via INTRAVENOUS

## 2014-09-17 MED ORDER — LACTATED RINGERS IV SOLN
INTRAVENOUS | Status: DC
  Administered 2014-09-17: 17:00:00 via INTRAVENOUS

## 2014-09-17 MED ORDER — PROPOFOL 10 MG/ML IV BOLUS
INTRAVENOUS | Status: DC | PRN
  Administered 2014-09-17: 180 mg via INTRAVENOUS

## 2014-09-17 MED ORDER — MENTHOL 3 MG MT LOZG: 1.0000 | LOZENGE | OROMUCOSAL | Status: DC | PRN

## 2014-09-17 MED ORDER — OXYCODONE-ACETAMINOPHEN 5-325 MG PO TABS
1.0000 | ORAL_TABLET | ORAL | Status: DC | PRN
  Administered 2014-09-17 – 2014-09-18 (×2): 1 via ORAL
  Administered 2014-09-18: 2 via ORAL
  Filled 2014-09-17 (×2): qty 1
  Filled 2014-09-17: qty 2

## 2014-09-17 MED ORDER — HYDROMORPHONE HCL 1 MG/ML IJ SOLN
1.0000 mg | INTRAMUSCULAR | Status: DC | PRN
  Administered 2014-09-18: 1 mg via INTRAVENOUS
  Filled 2014-09-17: qty 1

## 2014-09-17 MED ORDER — PANTOPRAZOLE SODIUM 40 MG PO TBEC
40.0000 mg | DELAYED_RELEASE_TABLET | Freq: Every day | ORAL | Status: DC
  Administered 2014-09-18: 40 mg via ORAL
  Filled 2014-09-17: qty 1

## 2014-09-17 MED ORDER — HYDROMORPHONE HCL 1 MG/ML IJ SOLN
0.2500 mg | INTRAMUSCULAR | Status: DC | PRN
  Administered 2014-09-17 (×2): 0.5 mg via INTRAVENOUS

## 2014-09-17 MED ORDER — KETOROLAC TROMETHAMINE 30 MG/ML IJ SOLN
INTRAMUSCULAR | Status: AC
  Filled 2014-09-17: qty 1

## 2014-09-17 MED ORDER — MIDAZOLAM HCL 2 MG/2ML IJ SOLN
INTRAMUSCULAR | Status: AC
  Filled 2014-09-17: qty 2

## 2014-09-17 MED ORDER — MIDAZOLAM HCL 2 MG/2ML IJ SOLN
INTRAMUSCULAR | Status: DC | PRN
  Administered 2014-09-17: 2 mg via INTRAVENOUS

## 2014-09-17 MED ORDER — SIMETHICONE 80 MG PO CHEW: 80.0000 mg | CHEWABLE_TABLET | Freq: Four times a day (QID) | ORAL | Status: DC | PRN

## 2014-09-17 MED ORDER — FENTANYL CITRATE (PF) 100 MCG/2ML IJ SOLN
INTRAMUSCULAR | Status: AC
  Filled 2014-09-17: qty 2

## 2014-09-17 MED ORDER — ACETAMINOPHEN 10 MG/ML IV SOLN
1000.0000 mg | Freq: Once | INTRAVENOUS | Status: AC
  Administered 2014-09-17: 1000 mg via INTRAVENOUS
  Filled 2014-09-17: qty 100

## 2014-09-17 MED ORDER — BUPIVACAINE HCL (PF) 0.5 % IJ SOLN
INTRAMUSCULAR | Status: DC | PRN
Start: 2014-09-17 — End: 2014-09-17
  Administered 2014-09-17: 15 mL
  Administered 2014-09-17: 2 mL

## 2014-09-17 MED ORDER — LACTATED RINGERS IV SOLN
INTRAVENOUS | Status: DC
  Administered 2014-09-17 (×2): via INTRAVENOUS

## 2014-09-17 MED ORDER — SCOPOLAMINE 1 MG/3DAYS TD PT72
MEDICATED_PATCH | TRANSDERMAL | Status: AC
  Administered 2014-09-17: 1.5 mg via TRANSDERMAL
  Filled 2014-09-17: qty 1

## 2014-09-17 MED ORDER — HYDROMORPHONE HCL 1 MG/ML IJ SOLN
INTRAMUSCULAR | Status: AC
  Filled 2014-09-17: qty 1

## 2014-09-17 MED ORDER — BUPIVACAINE-EPINEPHRINE 0.5% -1:200000 IJ SOLN
INTRAMUSCULAR | Status: DC | PRN
Start: 2014-09-17 — End: 2014-09-17
  Administered 2014-09-17: 30 mL

## 2014-09-17 MED ORDER — BUPIVACAINE-EPINEPHRINE (PF) 0.5% -1:200000 IJ SOLN
INTRAMUSCULAR | Status: AC
  Filled 2014-09-17: qty 30

## 2014-09-17 MED ORDER — MEPERIDINE HCL 25 MG/ML IJ SOLN: 6.2500 mg | INTRAMUSCULAR | Status: DC | PRN

## 2014-09-17 MED ORDER — ONDANSETRON HCL 4 MG PO TABS: 4.0000 mg | ORAL_TABLET | Freq: Four times a day (QID) | ORAL | Status: DC | PRN

## 2014-09-17 MED ORDER — LIDOCAINE HCL (CARDIAC) 20 MG/ML IV SOLN
INTRAVENOUS | Status: AC
  Filled 2014-09-17: qty 5

## 2014-09-17 MED ORDER — SCOPOLAMINE 1 MG/3DAYS TD PT72
1.0000 | MEDICATED_PATCH | Freq: Once | TRANSDERMAL | Status: DC
  Administered 2014-09-17: 1.5 mg via TRANSDERMAL

## 2014-09-17 SURGICAL SUPPLY — 41 items
APPLICATOR COTTON TIP 6IN STRL (MISCELLANEOUS) ×3 IMPLANT
CABLE HIGH FREQUENCY MONO STRZ (ELECTRODE) ×3 IMPLANT
CANISTER SUCT 3000ML (MISCELLANEOUS) ×3 IMPLANT
CLOTH BEACON ORANGE TIMEOUT ST (SAFETY) ×3 IMPLANT
CONT PATH 16OZ SNAP LID 3702 (MISCELLANEOUS) ×3 IMPLANT
COVER BACK TABLE 60X90IN (DRAPES) ×3 IMPLANT
DECANTER SPIKE VIAL GLASS SM (MISCELLANEOUS) ×6 IMPLANT
DRESSING OPSITE X SMALL 2X3 (GAUZE/BANDAGES/DRESSINGS) ×3 IMPLANT
DRSG COVADERM PLUS 2X2 (GAUZE/BANDAGES/DRESSINGS) ×6 IMPLANT
DRSG OPSITE POSTOP 3X4 (GAUZE/BANDAGES/DRESSINGS) ×3 IMPLANT
DURAPREP 26ML APPLICATOR (WOUND CARE) ×3 IMPLANT
ELECT REM PT RETURN 9FT ADLT (ELECTROSURGICAL)
ELECTRODE REM PT RTRN 9FT ADLT (ELECTROSURGICAL) IMPLANT
EVACUATOR SMOKE 8.L (FILTER) ×3 IMPLANT
GLOVE BIOGEL PI IND STRL 6.5 (GLOVE) ×1 IMPLANT
GLOVE BIOGEL PI IND STRL 7.0 (GLOVE) ×3 IMPLANT
GLOVE BIOGEL PI INDICATOR 6.5 (GLOVE) ×2
GLOVE BIOGEL PI INDICATOR 7.0 (GLOVE) ×6
GLOVE ECLIPSE 7.0 STRL STRAW (GLOVE) ×6 IMPLANT
LIQUID BAND (GAUZE/BANDAGES/DRESSINGS) ×3 IMPLANT
NEEDLE INSUFFLATION 150MM (ENDOMECHANICALS) ×3 IMPLANT
NEEDLE MAYO .5 CIRCLE (NEEDLE) ×3 IMPLANT
NS IRRIG 1000ML POUR BTL (IV SOLUTION) ×3 IMPLANT
PACK LAVH (CUSTOM PROCEDURE TRAY) ×3 IMPLANT
PACK ROBOTIC GOWN (GOWN DISPOSABLE) ×3 IMPLANT
PAD POSITIONER PINK NONSTERILE (MISCELLANEOUS) ×3 IMPLANT
SET IRRIG TUBING LAPAROSCOPIC (IRRIGATION / IRRIGATOR) IMPLANT
SHEARS HARMONIC ACE PLUS 36CM (ENDOMECHANICALS) IMPLANT
SUT VIC AB 0 CT1 18XCR BRD8 (SUTURE) ×2 IMPLANT
SUT VIC AB 0 CT1 36 (SUTURE) ×3 IMPLANT
SUT VIC AB 0 CT1 8-18 (SUTURE) ×4
SUT VICRYL 0 TIES 12 18 (SUTURE) ×3 IMPLANT
SUT VICRYL 0 UR6 27IN ABS (SUTURE) ×3 IMPLANT
SUT VICRYL 4-0 PS2 18IN ABS (SUTURE) ×6 IMPLANT
TOWEL OR 17X24 6PK STRL BLUE (TOWEL DISPOSABLE) ×9 IMPLANT
TRAY FOLEY CATH SILVER 14FR (SET/KITS/TRAYS/PACK) ×3 IMPLANT
TROCAR 5M 150ML BLDLS (TROCAR) ×3 IMPLANT
TROCAR XCEL NON-BLD 11X100MML (ENDOMECHANICALS) IMPLANT
TROCAR XCEL NON-BLD 5MMX100MML (ENDOMECHANICALS) ×6 IMPLANT
WARMER LAPAROSCOPE (MISCELLANEOUS) ×3 IMPLANT
WATER STERILE IRR 1000ML POUR (IV SOLUTION) ×3 IMPLANT

## 2014-09-17 NOTE — Transfer of Care (Signed)
Immediate Anesthesia Transfer of Care Note  Patient: Carrie Douglas  Procedure(s) Performed: Procedure(s): DIAGNOSTIC LAPAROSCOPIC, VAGINAL HYSTERECTOMY (N/A)  Patient Location: PACU  Anesthesia Type:General  Level of Consciousness: awake, alert , oriented and patient cooperative  Airway & Oxygen Therapy: Patient Spontanous Breathing and Patient connected to nasal cannula oxygen  Post-op Assessment: Report given to RN and Post -op Vital signs reviewed and stable  Post vital signs: Reviewed and stable  Last Vitals:  Filed Vitals:   09/17/14 1145  BP: 122/68  Pulse: 68  Temp: 37 C  Resp: 16    Complications: No apparent anesthesia complications

## 2014-09-17 NOTE — Op Note (Signed)
Carrie Douglas PROCEDURE DATE: 09/17/2014   PREOPERATIVE DIAGNOSIS: Abnormal uterine bleeding POSTOPERATIVE DIAGNOSIS: The same PROCEDURE: Laparoscopic Assisted Vaginal Hysterectomy SURGEON:  Dr. Verita Schneiders ASSISTANT: Dr. Wallie Renshaw  INDICATIONS: 39 y.o. [id number]  with history of menorrhagia desiring definitive surgical management.  Risks of surgery were discussed with the patient including but not limited to: bleeding which may require transfusion or reoperation; infection which may require antibiotics; injury to bowel, bladder, ureters or other surrounding organs; need for additional procedures including laparotomy; thromboembolic phenomenon, incisional problems and other postoperative/anesthesia complications. Written informed consent was obtained.    FINDINGS:  Small 10 week sized uterus, normal adnexa bilaterally.  Some adhesive disease between the anterior upper abdominal wall and omentum. No evidence of endometriosis, adhesions in the pelvis.    ANESTHESIA:    General INTRAVENOUS FLUIDS:2000  ml ESTIMATED BLOOD LOSS: 200 ml URINE OUTPUT: 50 ml SPECIMENS: Uterus and cervix COMPLICATIONS: None immediate  PROCEDURE IN DETAIL:  The patient received intravenous antibiotics and had sequential compression devices applied to her lower extremities while in the preoperative area.  She was then taken to the operating room where general anesthesia was administered and was found to be adequate.  She was placed in the dorsal lithotomy position, and was prepped and draped in a sterile manner.  A Foley catheter was inserted into her bladder and attached to constant drainage and a uterine manipulator was then advanced into the uterus .   After an adequate timeout was performed, attention was turned to the abdomen where an umbilical incision was made with the scalpel.  The Optiview 11-mm trocar and sleeve were then advanced without difficulty with the laparoscope under direct visualization into the  abdomen.  The abdomen was then insufflated with carbon dioxide gas and adequate pneumoperitoneum was obtained.  A survey of the patient's pelvis and abdomen revealed the findings above.   The uterine manipulator was removed without complications.  The decision was made to leave the trocar in place and proceed with vaginal hysterectomy.  Attention was then turned to her pelvis.  A weighted speculum was then placed in the vagina, and the anterior and posterior lips of the cervix were grasped bilaterally with tenaculums.  The cervix was then injected circumferentially with 0.5% Marcaine with epinephrine solution to maintain hemostasis.  The cervix was then circumferentially incised, and the bladder was dissected off the pubocervical fascia anteriorly without complication.  The anterior cul-de-sac was then entered sharply without difficulty and a retractor was placed.  The same procedure was performed posteriorly and the posterior cul-de-sac was entered sharply without difficulty.  A long weighted speculum was inserted into the posterior cul-de-sac.  The Heaney clamp was then used to clamp the uterosacral ligaments on either side.  They were then cut and sutured ligated with 0 Vicryl, and were held with a tag for later identification. Of note, all sutures used in this case were 0 Vicryl unless otherwise noted.   The cardinal ligaments were then clamped, cut and ligated bilaterally. The uterine vessels and broad ligaments were then serially clamped with the Heaney clamps, cut, and suture ligated on both sides.  Excellent hemostasis was noted at this point.  The uterus was then delivered via the posterior cul-de-sac, and the cornua were clamped with the Heaney clamps, transected, and the uterus was delivered and sent to pathology.  These pedicles were then suture ligated to ensure hemostasis.  After completion of the hysterectomy, all pedicles from the uterosacral ligament to the cornua were examined  hemostasis was  confirmed.  The vaginal cuff was then closed in a running locked fashion with care given to incorporate the uterosacral pedicles bilaterally.  All instruments were then removed from the pelvis.  Attention was then returned to her abdomen which was insufflated again with carbon dioxide gas.  The laparoscope was used to survey the operative site, and it was found to be hemostatic.  No intraoperative injury to surrounding organs was noted.  The abdomen was desufflated and all instruments were then removed from the patient's abdomen. The fascial incision was repaired with 0 Vicryl, and the skin was closed with Dermabond. The patient tolerated the procedures well.  All instruments, needles, and sponge counts were correct x 2. The patient was taken to the recovery room in stable condition.    Verita Schneiders, MD, [place] Attending [place], [facility]

## 2014-09-17 NOTE — Anesthesia Postprocedure Evaluation (Signed)
  Anesthesia Post-op Note  Patient: Carrie Douglas  Procedure(s) Performed: Procedure(s): DIAGNOSTIC LAPAROSCOPIC, VAGINAL HYSTERECTOMY (N/A)  Patient Location: PACU  Anesthesia Type:General  Level of Consciousness: awake, alert  and oriented  Airway and Oxygen Therapy: Patient Spontanous Breathing  Post-op Pain: none  Post-op Assessment: Post-op Vital signs reviewed, Patient's Cardiovascular Status Stable, Respiratory Function Stable, Patent Airway, No signs of Nausea or vomiting and Pain level controlled  Post-op Vital Signs: Reviewed and stable  Last Vitals:  Filed Vitals:   09/17/14 1615  BP:   Pulse:   Temp: 36.7 C  Resp:     Complications: No apparent anesthesia complications

## 2014-09-17 NOTE — Anesthesia Procedure Notes (Signed)
Procedure Name: Intubation Performed by: Casimer Lanius A Pre-anesthesia Checklist: Patient identified, Emergency Drugs available, Suction available and Patient being monitored Patient Re-evaluated:Patient Re-evaluated prior to inductionOxygen Delivery Method: Circle system utilized and Simple face mask Preoxygenation: Pre-oxygenation with 100% oxygen Intubation Type: IV induction and Inhalational induction Ventilation: Mask ventilation without difficulty Laryngoscope Size: Mac and 3 Grade View: Grade II Tube type: Oral Tube size: 7.0 mm Number of attempts: 1 Airway Equipment and Method: Stylet Placement Confirmation: ETT inserted through vocal cords under direct vision,  positive ETCO2 and breath sounds checked- equal and bilateral Secured at: 20 (right lip) cm Tube secured with: Tape Dental Injury: Teeth and Oropharynx as per pre-operative assessment

## 2014-09-17 NOTE — H&P (Signed)
Preoperative History and Physical  Carrie Douglas is a 39 y.o. [id number] here for definitive surgical management of AUB - menorrhagia.   No significant preoperative concerns.  Proposed surgery: Laparoscopic-assisted vaginal hysterectomy (LAVH)  Past Medical History  Diagnosis Date  . Rubella     5 months old  . Varicella     68 1/40 years old  . Anxiety   . Depression   . Vaginal delivery 1997, 1999, 2002, 2006, 2008, 2012  . Anemia     blood transfusion 09/14/10  . Iron deficiency anemia   . 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 Arthritis     "knees" (07/02/2014)  . Sleep apnea    Past Surgical History  Procedure Laterality Date  . Roux-en-y gastric bypass  2004  . Dilation and evacuation N/A 07/13/2013    Procedure: DILATATION AND EVACUATION;  Surgeon: Woodroe Mode, MD;  Location: [facility];  Service: Gynecology;  Laterality: N/A;  . Laparoscopic bilateral salpingectomy Bilateral 10/10/2013    Procedure: LAPAROSCOPIC BILATERAL SALPINGECTOMY;  Surgeon: Osborne Oman, MD;  Location: [facility];  Service: Gynecology;  Laterality: Bilateral;  with single site port  . Cholecystectomy open  2007  . Salpingoophorectomy Bilateral 08/2013  . Dilation and curettage of uterus  07/2013    "not a D&E"   OB History  Gravida Para Term Preterm AB SAB TAB Ectopic Multiple Living  6 6 6  0 0 0 0 0 0 6    # Outcome Date GA Lbr Len/2nd Weight Sex Delivery Anes PTL Lv  6 Term 12/26/10 101w4d 06:40 / 00:01 7 lb 1.4 oz (3.215 kg) M Vag-Spont None  Y  5 Term 2008 [redacted]w[redacted]d 07:00 7 lb 3 oz (3.26 kg) F Vag-Spont None  Y  4 Term 2006 [redacted]w[redacted]d 05:00 7 lb 6 oz (3.345 kg) F Vag-Spont None  Y  3 Term 2002 [redacted]w[redacted]d 10:00 9 lb 4 oz (4.196 kg) F Vag-Spont None  Y     Comments: gestational HTN  2 Term 1999 [redacted]w[redacted]d 15:00 9 lb 13 oz (4.451 kg) M Vag-Spont None  Y     Comments: no complications  1 Term 78/9381 [redacted]w[redacted]d 12:00 7 lb 9 oz (3.43 kg) F Vag-Spont None  Y      Comments: no complications    Patient denies any other pertinent gynecologic issues.   No current facility-administered medications on file prior to encounter.   Current Outpatient Prescriptions on File Prior to Encounter  Medication Sig Dispense Refill  . ferrous sulfate 325 (65 FE) MG tablet Take 1 tablet (325 mg total) by mouth 2 (two) times daily with a meal. 60 tablet 3  . megestrol (MEGACE) 40 MG tablet Take 1 tablet (40 mg total) by mouth 2 (two) times daily. Can increase to two tablets twice a day in the event of heavy bleeding (Patient taking differently: Take 80 mg by mouth 2 (two) times daily. ) 60 tablet 5  . Multiple Vitamins-Minerals (MULTIVITAMIN ADULT PO) Take 1 tablet by mouth daily.    . cetirizine (ZYRTEC) 10 MG tablet Take 10 mg by mouth daily as needed for allergies.   1  . ibuprofen (ADVIL,MOTRIN) 600 MG tablet Take 600 mg by mouth every 6 (six) hours as needed for mild pain.    . tranexamic acid (LYSTEDA) 650 MG TABS tablet Take 2 tablets (1,300 mg total) by mouth 3 (three) times daily. Take during menses  for a maximum of five days (Patient not taking: Reported on 09/13/2014) 30 tablet 12   Allergies  Allergen Reactions  . Codeine Nausea And Vomiting    Tylenol w/codeine makes her very sick.  . Other Itching    Burning Antibiotic for urinary tract infection:  Thinks cipro    Social History:   reports that she has been smoking Cigarettes.  She has a 1.25 pack-year smoking history. She has never used smokeless tobacco. She reports that she does not drink alcohol or use illicit drugs.  Family History  Problem Relation Age of Onset  . Lung cancer Father     Review of Systems: Noncontributory  PHYSICAL EXAM: Blood pressure 122/68, pulse 68, temperature 98.6 F (37 C), temperature source Oral, resp. rate 16, SpO2 100 %. CONSTITUTIONAL: Well-developed, well-nourished female in no acute distress.  HENT:  Normocephalic, atraumatic, External right and left ear  normal. Oropharynx is clear and moist EYES: Conjunctivae and EOM are normal. Pupils are equal, round, and reactive to light. No scleral icterus.  NECK: Normal range of motion, supple, no masses SKIN: Skin is warm and dry. No rash noted. Not diaphoretic. No erythema. No pallor. [place]: Alert and oriented to person, place, and time. Normal reflexes, muscle tone coordination. No cranial nerve deficit noted. PSYCHIATRIC: Normal mood and affect. Normal behavior. Normal judgment and thought content. CARDIOVASCULAR: Normal heart rate noted, regular rhythm RESPIRATORY: Effort and breath sounds normal, no problems with respiration noted ABDOMEN: Soft, nontender, nondistended. Well-healed laparoscopic sites. PELVIC: Deferred MUSCULOSKELETAL: Normal range of motion. No edema and no tenderness. 2+ distal pulses.  Labs: Results for orders placed or performed during the hospital encounter of 09/17/14 (from the past 336 hour(s))  Pregnancy, urine   Collection Time: 09/17/14 11:50 AM  Result Value Ref Range   Preg Test, Ur NEGATIVE NEGATIVE  Results for orders placed or performed during the hospital encounter of 09/13/14 (from the past 336 hour(s))  CBC   Collection Time: 09/13/14 10:10 AM  Result Value Ref Range   WBC 7.0 4.0 - 10.5 K/uL   RBC 4.49 3.87 - 5.11 MIL/uL   Hemoglobin 9.2 (L) 12.0 - 15.0 g/dL   HCT 31.3 (L) 36.0 - 46.0 %   MCV 69.7 (L) 78.0 - 100.0 fL   MCH 20.5 (L) 26.0 - 34.0 pg   MCHC 29.4 (L) 30.0 - 36.0 g/dL   RDW 18.5 (H) 11.5 - 15.5 %   Platelets 293 150 - 400 K/uL  Type and screen   Collection Time: 09/13/14 10:10 AM  Result Value Ref Range   ABO/RH(D) A POS    Antibody Screen NEG    Sample Expiration 09/13/2014    07/03/2014 CLINICAL DATA: Heavy menses. Decreased hemoglobin. EXAM: TRANSABDOMINAL AND TRANSVAGINAL ULTRASOUND OF PELVIS TECHNIQUE: Both transabdominal and transvaginal ultrasound examinations of the pelvis were performed. Transabdominal technique was  performed for global imaging of the pelvis including uterus, ovaries, adnexal regions, and pelvic cul-de-sac. It was necessary to proceed with endovaginal exam following the transabdominal exam to visualize the uterus and ovaries. COMPARISON: 07/10/2013. FINDINGS: Uterus Measurements: 10.3 x 5.7 x 6.6 cm. Echogenic debris and small amount of fluid noted in the lower uterine segment canal. This could represent blood byproducts. Nabothian cyst noted. Endometrium Thickness: 15 mm. No focal abnormality visualized. Right ovary Measurements: 3.0 x 2.0 x 2.5 cm. Normal appearance/no adnexal mass. Left ovary Measurements: 3.3 x 2.1 x 2.2 cm. Normal appearance/no adnexal mass. Other findings Trace free fluid in the cul-de-sac. IMPRESSION: Small  amount of fluid and echogenic debris noted in the lower uterine segment canal. Blood byproducts could present in this fashion. Trace free fluid in the cul-de-sac. Electronically Signed By: Marcello Moores Register  On: 07/03/2014 07:11   07/09/2014 Benign endometrial biopsy  Assessment: Patient Active Problem List   Diagnosis Date Noted  . Abnormal uterine bleeding (AUB) 08/07/2014  . Menorrhagia 07/03/2014  . Chest pain 07/02/2014  . Symptomatic anemia 07/02/2014  . Depression 07/02/2014  . Pain in the chest   . Syncope and collapse 05/31/2013  . Abdominal pain, left lower quadrant 09/06/2012  . Anemia of chronic disease 09/06/2012  . Intussusception of jejunum 09/06/2012  . Viral enteritis 09/06/2012  . Nausea vomiting and diarrhea 09/06/2012  . Frequent UTI 12/15/2010    Plan: Patient will undergo surgical management with laparoscopic-assisted vaginal hysterectomy (LAVH), given adhesions noted during her bilateral salpingectomy on 10/10/2013. She has had SVD x 6. No indication for oophorectomy. The risks of surgery were discussed in detail with the patient including but not limited to: bleeding which may require transfusion or reoperation;  infection which may require antibiotics; injury to surrounding organs which may involve bowel, bladder, ureters ; need for additional procedures including laparotomy; thromboembolic phenomenon, surgical site problems and other postoperative/anesthesia complications. Likelihood of success in alleviating the patient's condition was discussed. Routine postoperative instructions will be reviewed with the patient and her family in detail after surgery.  The patient concurred with the proposed plan, giving informed written consent for the surgery.  Patient has been NPO since last night she will remain NPO for procedure.  Anesthesia and OR aware.  Preoperative prophylactic antibiotics and SCDs ordered on call to the OR.  To OR when ready.   Verita Schneiders, M.D. 09/17/2014 12:27 PM

## 2014-09-18 ENCOUNTER — Encounter (HOSPITAL_COMMUNITY): Payer: Self-pay | Admitting: Obstetrics & Gynecology

## 2014-09-18 DIAGNOSIS — D259 Leiomyoma of uterus, unspecified: Secondary | ICD-10-CM | POA: Diagnosis not present

## 2014-09-18 LAB — CBC
HCT: 25 % — ABNORMAL LOW (ref 36.0–46.0)
Hemoglobin: 7.6 g/dL — ABNORMAL LOW (ref 12.0–15.0)
MCH: 20.9 pg — AB (ref 26.0–34.0)
MCHC: 30.4 g/dL (ref 30.0–36.0)
MCV: 68.7 fL — ABNORMAL LOW (ref 78.0–100.0)
PLATELETS: 251 10*3/uL (ref 150–400)
RBC: 3.64 MIL/uL — ABNORMAL LOW (ref 3.87–5.11)
RDW: 17.8 % — ABNORMAL HIGH (ref 11.5–15.5)
WBC: 8.3 10*3/uL (ref 4.0–10.5)

## 2014-09-18 MED ORDER — IBUPROFEN 600 MG PO TABS: 600.0000 mg | ORAL_TABLET | Freq: Four times a day (QID) | ORAL | Status: DC | PRN

## 2014-09-18 MED ORDER — DOCUSATE SODIUM 100 MG PO CAPS: 100.0000 mg | ORAL_CAPSULE | Freq: Two times a day (BID) | ORAL | Status: DC | PRN

## 2014-09-18 MED ORDER — FERROUS SULFATE 325 (65 FE) MG PO TABS
325.0000 mg | ORAL_TABLET | Freq: Three times a day (TID) | ORAL | Status: DC
  Administered 2014-09-18: 325 mg via ORAL
  Filled 2014-09-18: qty 1

## 2014-09-18 MED ORDER — OXYCODONE-ACETAMINOPHEN 5-325 MG PO TABS: 1.0000 | ORAL_TABLET | Freq: Four times a day (QID) | ORAL | Status: DC | PRN

## 2014-09-18 MED ORDER — DOCUSATE SODIUM 100 MG PO CAPS
100.0000 mg | ORAL_CAPSULE | Freq: Two times a day (BID) | ORAL | Status: DC
  Administered 2014-09-18: 100 mg via ORAL
  Filled 2014-09-18: qty 1

## 2014-09-18 MED ORDER — FERROUS SULFATE 325 (65 FE) MG PO TABS: 325.0000 mg | ORAL_TABLET | Freq: Three times a day (TID) | ORAL | Status: DC

## 2014-09-18 NOTE — Progress Notes (Signed)
Pt teaching complete  Ambulated out   

## 2014-09-18 NOTE — Discharge Summary (Signed)
Gynecology Physician Postoperative Discharge Summary  Patient ID: Carrie Douglas MRN: [id number] DOB/AGE: 07/05/1975 39 y.o.  Admit Date: 09/17/2014 Discharge Date: 09/18/2014  Preoperative Diagnoses: Menorrhagia  Procedures: LAPAROSCOPIC ASSISTED VAGINAL HYSTERECTOMY   Significant labs: CBC Latest Ref Rng 09/18/2014 09/13/2014 08/07/2014  WBC 4.0 - 10.5 K/uL 8.3 7.0 6.7  Hemoglobin 12.0 - 15.0 g/dL 7.6(L) 9.2(L) 9.0(L)  Hematocrit 36.0 - 46.0 % 25.0(L) 31.3(L) 31.1(L)  Platelets 150 - 400 K/uL 251 293 256    Hospital Course:  Carrie Douglas is a 39 y.o. [id number]  admitted for scheduled surgery.  She underwent the procedures as mentioned above, her operation was uncomplicated. For further details about surgery, please refer to the operative report. Patient had an uncomplicated postoperative course. By time of discharge on POD#1, her pain was controlled on oral pain medications; she was ambulating, voiding without difficulty, tolerating regular diet and passing flatus. She was deemed stable for discharge to home.   Discharge Exam: Blood pressure 103/51, pulse 70, temperature 98.6 F (37 C), temperature source Oral, resp. rate 20, height 5\' 6"  (1.676 m), weight 225 lb (102.059 kg), SpO2 97 %. General appearance: alert and no distress  Resp: clear to auscultation bilaterally  Cardio: regular rate and rhythm  GI: soft, non-tender; bowel sounds normal; no masses, no organomegaly.  Incision: C/D/I, no erythema, no drainage noted Pelvic: scant blood on pad  Extremities: extremities normal, atraumatic, no cyanosis or edema and Homans sign is negative, no sign of DVT  Discharged Condition: Stable  Disposition: 01-Home or Self Care     Medication List    STOP taking these medications        megestrol 40 MG tablet  Commonly known as:  MEGACE     tranexamic acid 650 MG Tabs tablet  Commonly known as:  LYSTEDA      TAKE these medications        calcium-vitamin D 500-200 MG-UNIT per tablet   Commonly known as:  OSCAL WITH D  Take 1 tablet by mouth daily with breakfast.     cetirizine 10 MG tablet  Commonly known as:  ZYRTEC  Take 10 mg by mouth daily as needed for allergies.     docusate sodium 100 MG capsule  Commonly known as:  COLACE  Take 1 capsule (100 mg total) by mouth 2 (two) times daily as needed for mild constipation.     ferrous sulfate 325 (65 FE) MG tablet  Take 1 tablet (325 mg total) by mouth 2 (two) times daily with a meal.     ferrous sulfate 325 (65 FE) MG tablet  Take 1 tablet (325 mg total) by mouth 3 (three) times daily with meals.     ibuprofen 600 MG tablet  Commonly known as:  ADVIL,MOTRIN  Take 600 mg by mouth every 6 (six) hours as needed for mild pain.     ibuprofen 600 MG tablet  Commonly known as:  ADVIL,MOTRIN  Take 1 tablet (600 mg total) by mouth every 6 (six) hours as needed (mild pain).     MULTIVITAMIN ADULT PO  Take 1 tablet by mouth daily.     oxyCODONE-acetaminophen 5-325 MG per tablet  Commonly known as:  PERCOCET/ROXICET  Take 1-2 tablets by mouth every 6 (six) hours as needed for severe pain (moderate to severe pain (when tolerating fluids)).           Follow-up Information    Follow up with Osborne Oman, MD On 10/16/2014.   Specialty:  Obstetrics and  Gynecology   Why:  2:45 pm for postoperative appointment. Call clinic/come to [facility] for any concerning issues.   Contact information:   [place] [address] [phone]       Signed:  Verita Schneiders, MD, [place] Attending [place], [facility]

## 2014-09-18 NOTE — Addendum Note (Signed)
Addendum  created 09/18/14 0929 by Ignacia Bayley, CRNA   Modules edited: Notes Section   Notes Section:  File: [id number]

## 2014-09-18 NOTE — Discharge Instructions (Signed)

## 2014-09-18 NOTE — Anesthesia Postprocedure Evaluation (Signed)
  Anesthesia Post-op Note  Patient: Carrie Douglas  Procedure(s) Performed: Procedure(s): DIAGNOSTIC LAPAROSCOPIC, VAGINAL HYSTERECTOMY (N/A)  Patient Location: [facility]  Anesthesia Type:General  Level of Consciousness: awake  Airway and Oxygen Therapy: Patient Spontanous Breathing  Post-op Pain: mild  Post-op Assessment: Patient's Cardiovascular Status Stable and Respiratory Function Stable              Post-op Vital Signs: stable  Last Vitals:  Filed Vitals:   09/18/14 0618  BP: 113/62  Pulse: 57  Temp: 36.7 C  Resp: 20    Complications: No apparent anesthesia complications

## 2014-09-19 ENCOUNTER — Telehealth: Payer: Self-pay

## 2014-09-19 NOTE — Telephone Encounter (Addendum)
Attempted to contact patient to inform her of benign surg path following hysterectomy. No answer. Left message stating we are calling with results, please call clinic.   1338: Patient returned call. Called patient and informed her of results. Patient verbalized understanding and gratitude. No questions or concerns.

## 2014-09-19 NOTE — Telephone Encounter (Signed)
-----   Message from Osborne Oman, MD sent at 09/19/2014 11:45 AM EDT ----- Benign surgical pathology after recent hysterectomy (LAVH on 09/17/14).  Please call to inform patient of results.

## 2014-10-16 ENCOUNTER — Ambulatory Visit (INDEPENDENT_AMBULATORY_CARE_PROVIDER_SITE_OTHER): Payer: Medicaid Other | Admitting: Obstetrics & Gynecology

## 2014-10-16 ENCOUNTER — Encounter: Payer: Self-pay | Admitting: Obstetrics & Gynecology

## 2014-10-16 VITALS — BP 120/59 | HR 77 | Temp 98.5°F | Resp 20 | Ht 66.0 in | Wt 228.5 lb

## 2014-10-16 DIAGNOSIS — Z9889 Other specified postprocedural states: Secondary | ICD-10-CM

## 2014-10-16 DIAGNOSIS — Z90710 Acquired absence of both cervix and uterus: Secondary | ICD-10-CM

## 2014-10-16 NOTE — Progress Notes (Signed)
   CLINIC ENCOUNTER NOTE  History:  39 y.o. [id number] here today for postoperative check s/p LAVH on 09/17/14.  Denies any bleeding, pain or other concerns. Very happy with her surgery.  Past Medical History  Diagnosis Date  . Rubella     5 months old  . Varicella     50 1/40 years old  . Anxiety   . Depression   . Vaginal delivery 1997, 1999, 2002, 2006, 2008, 2012  . Anemia     blood transfusion 09/14/10  . Iron deficiency anemia   . 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 Arthritis     "knees" (07/02/2014)  . Sleep apnea     Past Surgical History  Procedure Laterality Date  . Roux-en-y gastric bypass  2004  . Dilation and evacuation N/A 07/13/2013    Procedure: DILATATION AND EVACUATION;  Surgeon: Woodroe Mode, MD;  Location: [facility];  Service: Gynecology;  Laterality: N/A;  . Laparoscopic bilateral salpingectomy Bilateral 10/10/2013    Procedure: LAPAROSCOPIC BILATERAL SALPINGECTOMY;  Surgeon: Osborne Oman, MD;  Location: [facility];  Service: Gynecology;  Laterality: Bilateral;  with single site port  . Cholecystectomy open  2007  . Salpingoophorectomy Bilateral 08/2013  . Dilation and curettage of uterus  07/2013    "not a D&E"  . Laparoscopic assisted vaginal hysterectomy N/A 09/17/2014    Procedure: DIAGNOSTIC LAPAROSCOPIC, VAGINAL HYSTERECTOMY;  Surgeon: Osborne Oman, MD;  Location: [facility];  Service: Gynecology;  Laterality: N/A;    The following portions of the patient's history were reviewed and updated as appropriate: allergies, current medications, past family history, past medical history, past social history, past surgical history and problem list.   Review of Systems:  A comprehensive review of systems was negative.   Objective:  Physical Exam BP 120/59 mmHg  Pulse 77  Temp(Src) 98.5 F (36.9 C) (Oral)  Resp 20  Ht 5\' 6"  (1.676 m)  Wt 228 lb 8 oz (103.647 kg)  BMI 36.90 kg/m2  LMP  08/22/2014 (Exact Date) CONSTITUTIONAL: Well-developed, well-nourished female in no acute distress.  HENT:  Normocephalic, atraumatic. External right and left ear normal. Oropharynx is clear and moist EYES: Conjunctivae and EOM are normal. Pupils are equal, round, and reactive to light. No scleral icterus.  NECK: Normal range of motion, supple, no masses SKIN: Skin is warm and dry. No rash noted. Not diaphoretic. No erythema. No pallor. [place]: Alert and oriented to person, place, and time. Normal reflexes, muscle tone coordination. No cranial nerve deficit noted. PSYCHIATRIC: Normal mood and affect. Normal behavior. Normal judgment and thought content. CARDIOVASCULAR: Normal heart rate noted RESPIRATORY: Effort and breath sounds normal, no problems with respiration noted ABDOMEN: Soft, no distention noted.  Well-healed umbilical incision. PELVIC: Normal appearing external genitalia; normal appearing vaginal mucosa and well-healing cuff.  No abnormal discharge noted.   MUSCULOSKELETAL: Normal range of motion. No edema noted.  Surgical Pathology (09/17/2014) Uterus and cervix - UTERUS: -ENDOMETRIUM: SECRETORY ENDOMETRIUM. ENDOMETRIAL TYPE POLYPS. NO HYPERPLASIA OR MALIGNANCY. -MYOMETRIUM: LEIOMYOMATA. -SEROSA: UNREMARKABLE. NO MALIGNANCY. - CERVIX: BENIGN SQUAMOUS AND ENDOCERVICAL MUCOSA. NO DYSPLASIA OR MALIGNANCY.  Assessment & Plan:  Normal postoperative check, reviewed pathology findings. Routine preventative health maintenance measures emphasized. Please refer to After Visit Summary for other counseling recommendations.    Verita Schneiders, MD, [place] Attending [place] for [employer], [place]

## 2014-10-16 NOTE — Patient Instructions (Signed)
Return to clinic for any scheduled appointments or for any gynecologic concerns as needed.   

## 2014-10-17 ENCOUNTER — Telehealth: Payer: Self-pay | Admitting: *Deleted

## 2014-10-17 ENCOUNTER — Encounter: Payer: Self-pay | Admitting: *Deleted

## 2014-10-17 NOTE — Telephone Encounter (Signed)
Per Dr. Harolyn Rutherford patient may return to work with no restrictions.

## 2014-10-17 NOTE — Telephone Encounter (Signed)
Patient called requesting return to work note, to go back with no restrictions. Called Dr. Harolyn Rutherford to get her recommendation. Manuela Schwartz at [facility] to call me back with her recommendation.

## 2014-11-28 ENCOUNTER — Encounter (HOSPITAL_COMMUNITY): Payer: Self-pay | Admitting: Family Medicine

## 2014-11-28 ENCOUNTER — Emergency Department (HOSPITAL_COMMUNITY)
Admission: EM | Admit: 2014-11-28 | Discharge: 2014-11-28 | Disposition: A | Discharge status: Home / Self Care | Payer: Medicaid Other | Attending: Emergency Medicine | Admitting: Emergency Medicine

## 2014-11-28 DIAGNOSIS — Z3202 Encounter for pregnancy test, result negative: Secondary | ICD-10-CM | POA: Diagnosis not present

## 2014-11-28 DIAGNOSIS — Z79899 Other long term (current) drug therapy: Secondary | ICD-10-CM | POA: Insufficient documentation

## 2014-11-28 DIAGNOSIS — D509 Iron deficiency anemia, unspecified: Secondary | ICD-10-CM | POA: Diagnosis not present

## 2014-11-28 DIAGNOSIS — N938 Other specified abnormal uterine and vaginal bleeding: Secondary | ICD-10-CM | POA: Diagnosis present

## 2014-11-28 DIAGNOSIS — M545 Low back pain: Secondary | ICD-10-CM | POA: Insufficient documentation

## 2014-11-28 DIAGNOSIS — F419 Anxiety disorder, unspecified: Secondary | ICD-10-CM | POA: Diagnosis not present

## 2014-11-28 DIAGNOSIS — F329 Major depressive disorder, single episode, unspecified: Secondary | ICD-10-CM | POA: Diagnosis not present

## 2014-11-28 DIAGNOSIS — R102 Pelvic and perineal pain: Secondary | ICD-10-CM | POA: Diagnosis not present

## 2014-11-28 DIAGNOSIS — M1990 Unspecified osteoarthritis, unspecified site: Secondary | ICD-10-CM | POA: Insufficient documentation

## 2014-11-28 DIAGNOSIS — Z720 Tobacco use: Secondary | ICD-10-CM | POA: Insufficient documentation

## 2014-11-28 DIAGNOSIS — Z8669 Personal history of other diseases of the nervous system and sense organs: Secondary | ICD-10-CM | POA: Diagnosis not present

## 2014-11-28 LAB — URINE MICROSCOPIC-ADD ON

## 2014-11-28 LAB — URINALYSIS, ROUTINE W REFLEX MICROSCOPIC
Bilirubin Urine: NEGATIVE
Glucose, UA: NEGATIVE mg/dL
Hgb urine dipstick: NEGATIVE
Ketones, ur: NEGATIVE mg/dL
Nitrite: NEGATIVE
Protein, ur: NEGATIVE mg/dL
Specific Gravity, Urine: 1.019 (ref 1.005–1.030)
Urobilinogen, UA: 1 mg/dL (ref 0.0–1.0)
pH: 6 (ref 5.0–8.0)

## 2014-11-28 LAB — I-STAT CHEM 8, ED
BUN: 14 mg/dL (ref 6–20)
CHLORIDE: 103 mmol/L (ref 101–111)
CREATININE: 0.8 mg/dL (ref 0.44–1.00)
Calcium, Ion: 1.19 mmol/L (ref 1.12–1.23)
GLUCOSE: 86 mg/dL (ref 65–99)
HCT: 32 % — ABNORMAL LOW (ref 36.0–46.0)
Hemoglobin: 10.9 g/dL — ABNORMAL LOW (ref 12.0–15.0)
POTASSIUM: 4.2 mmol/L (ref 3.5–5.1)
Sodium: 139 mmol/L (ref 135–145)
TCO2: 24 mmol/L (ref 0–100)

## 2014-11-28 LAB — WET PREP, GENITAL
Clue Cells Wet Prep HPF POC: NONE SEEN
TRICH WET PREP: NONE SEEN
Yeast Wet Prep HPF POC: NONE SEEN

## 2014-11-28 LAB — I-STAT BETA HCG BLOOD, ED (MC, WL, AP ONLY): I-stat hCG, quantitative: <5 m[IU]/mL

## 2014-11-28 LAB — POC OCCULT BLOOD, ED: Fecal Occult Bld: NEGATIVE

## 2014-11-28 MED ORDER — ONDANSETRON 4 MG PO TBDP
4.0000 mg | ORAL_TABLET | Freq: Once | ORAL | Status: AC
  Administered 2014-11-28: 4 mg via ORAL
  Filled 2014-11-28: qty 1

## 2014-11-28 MED ORDER — TRAMADOL HCL 50 MG PO TABS: 50.0000 mg | ORAL_TABLET | Freq: Four times a day (QID) | ORAL | Status: DC | PRN

## 2014-11-28 MED ORDER — OXYCODONE-ACETAMINOPHEN 5-325 MG PO TABS
1.0000 | ORAL_TABLET | Freq: Once | ORAL | Status: AC
  Administered 2014-11-28: 1 via ORAL
  Filled 2014-11-28: qty 1

## 2014-11-28 MED ORDER — ONDANSETRON HCL 4 MG PO TABS
4.0000 mg | ORAL_TABLET | Freq: Four times a day (QID) | ORAL | Status: DC
Start: 2014-11-28 — End: 2015-01-13

## 2014-11-28 NOTE — ED Notes (Signed)
Pelvic exam performed by Tiffany - PA and Dorethea Clan. - EMT assisted.

## 2014-11-28 NOTE — ED Provider Notes (Signed)
CSN: [id number]     Arrival date & time 11/28/14  1644 History  This chart was scribed for Delos Haring, PA-C, working with Deno Etienne, DO by Julien Nordmann, ED Scribe. This patient was seen in room TR02C/TR02C and the patient's care was started at 6:50 PM.      Chief Complaint  Patient presents with  . Back Pain  . Vaginal Bleeding      The history is provided by the patient. No language interpreter was used.   HPI Comments: Carrie Douglas is a 39 y.o. female who presents to the Emergency Department complaining of constant, gradual worsening lower abdominal pain onset one week ago. She has associated vaginal irritation and small amount bleeding (not sure where the bleeding is coming from) that started last night that she noticed when she wiped.  Pt notes her abdominal pain radiates to her lower back. She has a past surgical hx of having a hysterectomy and oophorectomy just recently this past June. Pt is sexually active but denies any recent aggressive sexual activity. She admits that she had similar pain after having sex with her husband and they recently had sex. She further denies hx of rectal bleeding and diarrhea.  PCP: Philis Fendt, MD Blood pressure 116/74, pulse 74, temperature 98 F (36.7 C), resp. rate 20, last menstrual period 08/22/2014, SpO2 100 %.  The patient denies diaphoresis, fever, headache, weakness (general or focal), confusion, change of vision,  neck pain, dysphagia, aphagia, chest pain, shortness of breath, nausea, vomiting, diarrhea, lower extremity swelling, rash.   Past Medical History  Diagnosis Date  . Rubella     5 months old  . Varicella     51 1/40 years old  . Anxiety   . Depression   . Vaginal delivery 1997, 1999, 2002, 2006, 2008, 2012  . Anemia     blood transfusion 09/14/10  . Iron deficiency anemia   . 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 Arthritis     "knees" (07/02/2014)   . Sleep apnea    Past Surgical History  Procedure Laterality Date  . Roux-en-y gastric bypass  2004  . Dilation and evacuation N/A 07/13/2013    Procedure: DILATATION AND EVACUATION;  Surgeon: Woodroe Mode, MD;  Location: [facility];  Service: Gynecology;  Laterality: N/A;  . Laparoscopic bilateral salpingectomy Bilateral 10/10/2013    Procedure: LAPAROSCOPIC BILATERAL SALPINGECTOMY;  Surgeon: Osborne Oman, MD;  Location: [facility];  Service: Gynecology;  Laterality: Bilateral;  with single site port  . Cholecystectomy open  2007  . Salpingoophorectomy Bilateral 08/2013  . Dilation and curettage of uterus  07/2013    "not a D&E"  . Laparoscopic assisted vaginal hysterectomy N/A 09/17/2014    Procedure: DIAGNOSTIC LAPAROSCOPIC, VAGINAL HYSTERECTOMY;  Surgeon: Osborne Oman, MD;  Location: [facility];  Service: Gynecology;  Laterality: N/A;   Family History  Problem Relation Age of Onset  . Lung cancer Father    Social History  Substance Use Topics  . Smoking status: Current Every Day Smoker -- 0.25 packs/day for 5 years    Types: Cigarettes  . Smokeless tobacco: Never Used  . Alcohol Use: No   OB History    Gravida Para Term Preterm AB TAB SAB Ectopic Multiple Living   6 6 6  0 0 0 0 0 0 6     Review of Systems  Gastrointestinal: Negative for vomiting, diarrhea and  blood in stool.  10 Systems reviewed and are negative for acute change except as noted in the HPI.     Allergies  Codeine and Other  Home Medications   Prior to Admission medications   Medication Sig Start Date End Date Taking? Authorizing Provider  calcium-vitamin D (OSCAL WITH D) 500-200 MG-UNIT per tablet Take 1 tablet by mouth daily with breakfast.    Historical Provider, MD  cetirizine (ZYRTEC) 10 MG tablet Take 10 mg by mouth daily as needed for allergies.  06/28/14   Historical Provider, MD  docusate sodium (COLACE) 100 MG capsule Take 1 capsule (100 mg total) by mouth 2 (two) times daily as needed for mild  constipation. Patient not taking: Reported on 10/16/2014 09/18/14   Osborne Oman, MD  ferrous sulfate 325 (65 FE) MG tablet Take 1 tablet (325 mg total) by mouth 2 (two) times daily with a meal. 07/03/14   Domenic Polite, MD  ibuprofen (ADVIL,MOTRIN) 600 MG tablet Take 1 tablet (600 mg total) by mouth every 6 (six) hours as needed (mild pain). 09/18/14   Osborne Oman, MD  Multiple Vitamins-Minerals (MULTIVITAMIN ADULT PO) Take 1 tablet by mouth daily.    Historical Provider, MD  ondansetron (ZOFRAN) 4 MG tablet Take 1 tablet (4 mg total) by mouth every 6 (six) hours. 11/28/14   Delos Haring, PA-C  oxyCODONE-acetaminophen (PERCOCET/ROXICET) 5-325 MG per tablet Take 1-2 tablets by mouth every 6 (six) hours as needed for severe pain (moderate to severe pain (when tolerating fluids)). Patient not taking: Reported on 10/16/2014 09/18/14   Osborne Oman, MD  traMADol (ULTRAM) 50 MG tablet Take 1 tablet (50 mg total) by mouth every 6 (six) hours as needed. 11/28/14   Delos Haring, PA-C   Triage vitals: BP 112/66 mmHg  Pulse 73  Temp(Src) 98 F (36.7 C)  Resp 18  SpO2 98%  LMP 08/22/2014 (Exact Date) Physical Exam  Constitutional: She appears well-developed and well-nourished. No distress.  HENT:  Head: Normocephalic and atraumatic.  Eyes: Right eye exhibits no discharge. Left eye exhibits no discharge.  Pulmonary/Chest: Effort normal. No respiratory distress.  Abdominal: Bowel sounds are normal. There is no rigidity, no rebound, no guarding and no CVA tenderness.  Mild suprapubic discomfort on palpation.   Genitourinary: Rectum normal. Guaiac negative stool. No tenderness or bleeding in the vagina. No foreign body around the vagina. Vaginal discharge found.  White thick discharge in vaginal vault. Cervix is surgically absent. No signs of bleeding or fissure from anus, vagina or urethra.  Neurological: She is alert. Coordination normal.  Skin: No rash noted. She is not diaphoretic.   Psychiatric: She has a normal mood and affect. Her behavior is normal.  Nursing note and vitals reviewed.   ED Course  Procedures  DIAGNOSTIC STUDIES: Oxygen Saturation is 98% on RA, normal by my interpretation.  COORDINATION OF CARE:  6:54 PM Discussed treatment plan which includes pain medication and pelvic exam with pt at bedside and pt agreed to plan.  Labs Review Labs Reviewed  WET PREP, GENITAL - Abnormal; Notable for the following:    WBC, Wet Prep HPF POC FEW (*)    All other components within normal limits  URINALYSIS, ROUTINE W REFLEX MICROSCOPIC (NOT AT [facility] - Abnormal; Notable for the following:    Leukocytes, UA TRACE (*)    All other components within normal limits  URINE MICROSCOPIC-ADD ON - Abnormal; Notable for the following:    Squamous Epithelial / LPF MANY (*)  Bacteria, UA MANY (*)    All other components within normal limits  I-STAT CHEM 8, ED - Abnormal; Notable for the following:    Hemoglobin 10.9 (*)    HCT 32.0 (*)    All other components within normal limits  I-STAT BETA HCG BLOOD, ED ([facility], [facility], [facility] ONLY)  POC OCCULT BLOOD, ED  GC/CHLAMYDIA PROBE AMP ([place]) NOT AT [facility]    Imaging Review No results found. I have personally reviewed and evaluated these images and lab results as part of my medical decision-making.   EKG Interpretation None      MDM   Final diagnoses:  Vaginal pain   No bleeding or signs of trauma to genitalia. Hemoccult negative Wet prep shows no significant findings to explain pain Chem 8 unremarkable UA is contaminated, urine culture sent out but otherwise unremarkable. No hemoglobin.  Patients pain is most likely related to recent sexual activity after hysterectomy. She says she see's her gynecologist and PCP within the next week. Lab results printed off and strict return to ED precautions given. Considered CT abd/pelv but she has no fever, weakness, nausea,  Vomiting, diarrhea, normal  Labs, no signs of  infection and her abdomen is soft and only mildly tender to palpation.   Rx: pain and nausea medication (Zofran and Ultram)  Medications  oxyCODONE-acetaminophen (PERCOCET/ROXICET) 5-325 MG per tablet 1 tablet (1 tablet Oral Given 11/28/14 2047)  ondansetron (ZOFRAN-ODT) disintegrating tablet 4 mg (4 mg Oral Given 11/28/14 2047)    39 y.o.Carrie Douglas's evaluation in the Emergency Department is complete. It has been determined that no acute conditions requiring further emergency intervention are present at this time. The patient/guardian have been advised of the diagnosis and plan. We have discussed signs and symptoms that warrant return to the ED, such as changes or worsening in symptoms.  Vital signs are stable at discharge. Filed Vitals:   11/28/14 2110  BP: 129/76  Pulse: 61  Temp: 97.7 F (36.5 C)  Resp: 20    Patient/guardian has voiced understanding and agreed to follow-up with the PCP or specialist.   I personally performed the services described in this documentation, which was scribed in my presence. The recorded information has been reviewed and is accurate.   Delos Haring, PA-C 11/28/14 2112  Deno Etienne, DO 11/28/14 2322

## 2014-11-28 NOTE — Discharge Instructions (Signed)
Pelvic Pain Female pelvic pain can be caused by many different things and start from a variety of places. Pelvic pain refers to pain that is located in the lower half of the abdomen and between your hips. The pain may occur over a short period of time (acute) or may be reoccurring (chronic). The cause of pelvic pain may be related to disorders affecting the female reproductive organs (gynecologic), but it may also be related to the bladder, kidney stones, an intestinal complication, or muscle or skeletal problems. Getting help right away for pelvic pain is important, especially if there has been severe, sharp, or a sudden onset of unusual pain. It is also important to get help right away because some types of pelvic pain can be life threatening.  CAUSES  Below are only some of the causes of pelvic pain. The causes of pelvic pain can be in one of several categories.   Gynecologic.  Pelvic inflammatory disease.  Sexually transmitted infection.  Ovarian cyst or a twisted ovarian ligament (ovarian torsion).  Uterine lining that grows outside the uterus (endometriosis).  Fibroids, cysts, or tumors.  Ovulation.  Pregnancy.  Pregnancy that occurs outside the uterus (ectopic pregnancy).  Miscarriage.  Labor.  Abruption of the placenta or ruptured uterus.  Infection.  Uterine infection (endometritis).  Bladder infection.  Diverticulitis.  Miscarriage related to a uterine infection (septic abortion).  Bladder.  Inflammation of the bladder (cystitis).  Kidney stone(s).  Gastrointestinal.  Constipation.  Diverticulitis.  Neurologic.  Trauma.  Feeling pelvic pain because of mental or emotional causes (psychosomatic).  Cancers of the bowel or pelvis. EVALUATION  Your caregiver will want to take a careful history of your concerns. This includes recent changes in your health, a careful gynecologic history of your periods (menses), and a sexual history. Obtaining your family  history and medical history is also important. Your caregiver may suggest a pelvic exam. A pelvic exam will help identify the location and severity of the pain. It also helps in the evaluation of which organ system may be involved. In order to identify the cause of the pelvic pain and be properly treated, your caregiver may order tests. These tests may include:   A pregnancy test.  Pelvic ultrasonography.  An X-ray exam of the abdomen.  A urinalysis or evaluation of vaginal discharge.  Blood tests. HOME CARE INSTRUCTIONS   Only take over-the-counter or prescription medicines for pain, discomfort, or fever as directed by your caregiver.   Rest as directed by your caregiver.   Eat a balanced diet.   Drink enough fluids to make your urine clear or pale yellow, or as directed.   Avoid sexual intercourse if it causes pain.   Apply warm or cold compresses to the lower abdomen depending on which one helps the pain.   Avoid stressful situations.   Keep a journal of your pelvic pain. Write down when it started, where the pain is located, and if there are things that seem to be associated with the pain, such as food or your menstrual cycle.  Follow up with your caregiver as directed.  SEEK MEDICAL CARE IF:  Your medicine does not help your pain.  You have abnormal vaginal discharge. SEEK IMMEDIATE MEDICAL CARE IF:   You have heavy bleeding from the vagina.   Your pelvic pain increases.   You feel light-headed or faint.   You have chills.   You have pain with urination or blood in your urine.   You have uncontrolled diarrhea   or vomiting.   You have a fever or persistent symptoms for more than 3 days.  You have a fever and your symptoms suddenly get worse.   You are being physically or sexually abused.  MAKE SURE YOU:  Understand these instructions.  Will watch your condition.  Will get help if you are not doing well or get worse. Document Released:  02/24/2004 Document Revised: 08/13/2013 Document Reviewed: 07/19/2011 ExitCare Patient Information 2015 [employer]. This information is not intended to replace advice given to you by your health care provider. Make sure you discuss any questions you have with your health care provider.  

## 2014-11-28 NOTE — ED Notes (Signed)
Pt here for back pain, vaginal bleeding and possible UTI.

## 2014-11-29 LAB — GC/CHLAMYDIA PROBE AMP (~~LOC~~) NOT AT ARMC
CHLAMYDIA, DNA PROBE: NEGATIVE
Neisseria Gonorrhea: NEGATIVE

## 2014-12-01 LAB — URINE CULTURE: Special Requests: NORMAL

## 2015-01-13 ENCOUNTER — Emergency Department (HOSPITAL_COMMUNITY)
Admission: EM | Admit: 2015-01-13 | Discharge: 2015-01-13 | Disposition: A | Discharge status: Home / Self Care | Payer: Medicaid Other | Attending: Emergency Medicine | Admitting: Emergency Medicine

## 2015-01-13 ENCOUNTER — Encounter (HOSPITAL_COMMUNITY): Payer: Self-pay | Admitting: *Deleted

## 2015-01-13 DIAGNOSIS — R21 Rash and other nonspecific skin eruption: Secondary | ICD-10-CM | POA: Diagnosis not present

## 2015-01-13 DIAGNOSIS — F419 Anxiety disorder, unspecified: Secondary | ICD-10-CM | POA: Diagnosis not present

## 2015-01-13 DIAGNOSIS — Z8669 Personal history of other diseases of the nervous system and sense organs: Secondary | ICD-10-CM | POA: Insufficient documentation

## 2015-01-13 DIAGNOSIS — M1990 Unspecified osteoarthritis, unspecified site: Secondary | ICD-10-CM | POA: Diagnosis not present

## 2015-01-13 DIAGNOSIS — N898 Other specified noninflammatory disorders of vagina: Secondary | ICD-10-CM | POA: Diagnosis present

## 2015-01-13 DIAGNOSIS — F329 Major depressive disorder, single episode, unspecified: Secondary | ICD-10-CM | POA: Insufficient documentation

## 2015-01-13 DIAGNOSIS — D509 Iron deficiency anemia, unspecified: Secondary | ICD-10-CM | POA: Diagnosis not present

## 2015-01-13 DIAGNOSIS — Z79899 Other long term (current) drug therapy: Secondary | ICD-10-CM | POA: Diagnosis not present

## 2015-01-13 DIAGNOSIS — R102 Pelvic and perineal pain: Secondary | ICD-10-CM | POA: Insufficient documentation

## 2015-01-13 DIAGNOSIS — Z8619 Personal history of other infectious and parasitic diseases: Secondary | ICD-10-CM | POA: Diagnosis not present

## 2015-01-13 DIAGNOSIS — R350 Frequency of micturition: Secondary | ICD-10-CM | POA: Insufficient documentation

## 2015-01-13 DIAGNOSIS — Z720 Tobacco use: Secondary | ICD-10-CM | POA: Insufficient documentation

## 2015-01-13 LAB — URINALYSIS, ROUTINE W REFLEX MICROSCOPIC
Glucose, UA: NEGATIVE mg/dL
HGB URINE DIPSTICK: NEGATIVE
Ketones, ur: NEGATIVE mg/dL
Nitrite: NEGATIVE
Protein, ur: NEGATIVE mg/dL
SPECIFIC GRAVITY, URINE: 1.03 (ref 1.005–1.030)
Urobilinogen, UA: 1 mg/dL (ref 0.0–1.0)
pH: 5.5 (ref 5.0–8.0)

## 2015-01-13 LAB — URINE MICROSCOPIC-ADD ON

## 2015-01-13 LAB — WET PREP, GENITAL
CLUE CELLS WET PREP: NONE SEEN
TRICH WET PREP: NONE SEEN
WBC, Wet Prep HPF POC: NONE SEEN
YEAST WET PREP: NONE SEEN

## 2015-01-13 MED ORDER — NYSTATIN 100000 UNIT/GM EX CREA: TOPICAL_CREAM | CUTANEOUS | Status: DC

## 2015-01-13 MED ORDER — TRAMADOL HCL 50 MG PO TABS: 50.0000 mg | ORAL_TABLET | Freq: Four times a day (QID) | ORAL | Status: DC | PRN

## 2015-01-13 NOTE — ED Provider Notes (Signed)
CSN: [id number]     Arrival date & time 01/13/15  0718 History   First MD Initiated Contact with Patient 01/13/15 [phone]     Chief Complaint  Patient presents with  . Vaginal Discharge     (Consider location/radiation/quality/duration/timing/severity/associated sxs/prior Treatment) HPI Comments: Patient with history of hysterectomy and 6/16 -- presents with complaint of recurrent vaginal discharge, itching and irritation. Patient was seen in emergency department with negative workup and 8/16. She was discharged to home with Zofran and tramadol patient has been able to obtain a GYN appointment for 3 days. She states that the itching and irritation has been unbearable. She has tried Monistat without relief. She complains of urinary frequency as well, no dysuria. No fevers, nausea, vomiting, diarrhea. Onset of symptoms acute. Course is constant.  Patient is a 39 y.o. female presenting with vaginal discharge. The history is provided by the patient.  Vaginal Discharge Associated symptoms: no abdominal pain, no dysuria, no fever, no nausea and no vomiting     Past Medical History  Diagnosis Date  . Rubella     5 months old  . Varicella     87 1/40 years old  . Anxiety   . Depression   . Vaginal delivery 1997, 1999, 2002, 2006, 2008, 2012  . Anemia     blood transfusion 09/14/10  . Iron deficiency anemia   . 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 Arthritis     "knees" (07/02/2014)  . Sleep apnea    Past Surgical History  Procedure Laterality Date  . Roux-en-y gastric bypass  2004  . Dilation and evacuation N/A 07/13/2013    Procedure: DILATATION AND EVACUATION;  Surgeon: Woodroe Mode, MD;  Location: [facility];  Service: Gynecology;  Laterality: N/A;  . Laparoscopic bilateral salpingectomy Bilateral 10/10/2013    Procedure: LAPAROSCOPIC BILATERAL SALPINGECTOMY;  Surgeon: Osborne Oman, MD;  Location: [facility];  Service:  Gynecology;  Laterality: Bilateral;  with single site port  . Cholecystectomy open  2007  . Salpingoophorectomy Bilateral 08/2013  . Dilation and curettage of uterus  07/2013    "not a D&E"  . Laparoscopic assisted vaginal hysterectomy N/A 09/17/2014    Procedure: DIAGNOSTIC LAPAROSCOPIC, VAGINAL HYSTERECTOMY;  Surgeon: Osborne Oman, MD;  Location: [facility];  Service: Gynecology;  Laterality: N/A;  . Abdominal hysterectomy     Family History  Problem Relation Age of Onset  . Lung cancer Father    Social History  Substance Use Topics  . Smoking status: Current Every Day Smoker -- 0.25 packs/day for 5 years    Types: Cigarettes  . Smokeless tobacco: Never Used  . Alcohol Use: No   OB History    Gravida Para Term Preterm AB TAB SAB Ectopic Multiple Living   6 6 6  0 0 0 0 0 0 6     Review of Systems  Constitutional: Negative for fever.  HENT: Negative for rhinorrhea and sore throat.   Eyes: Negative for redness.  Respiratory: Negative for cough.   Cardiovascular: Negative for chest pain.  Gastrointestinal: Negative for nausea, vomiting, abdominal pain and diarrhea.  Genitourinary: Positive for frequency and vaginal discharge. Negative for dysuria and vaginal bleeding.  Musculoskeletal: Negative for myalgias.  Skin: Positive for rash.  Neurological: Negative for headaches.    Allergies  Codeine and Other  Home Medications   Prior to Admission medications   Medication Sig Start Date  End Date Taking? Authorizing Provider  calcium-vitamin D (OSCAL WITH D) 500-200 MG-UNIT per tablet Take 1 tablet by mouth daily with breakfast.    Historical Provider, MD  cetirizine (ZYRTEC) 10 MG tablet Take 10 mg by mouth daily as needed for allergies.  06/28/14   Historical Provider, MD  docusate sodium (COLACE) 100 MG capsule Take 1 capsule (100 mg total) by mouth 2 (two) times daily as needed for mild constipation. Patient not taking: Reported on 10/16/2014 09/18/14   Osborne Oman, MD   ferrous sulfate 325 (65 FE) MG tablet Take 1 tablet (325 mg total) by mouth 2 (two) times daily with a meal. 07/03/14   Domenic Polite, MD  ibuprofen (ADVIL,MOTRIN) 600 MG tablet Take 1 tablet (600 mg total) by mouth every 6 (six) hours as needed (mild pain). 09/18/14   Osborne Oman, MD  Multiple Vitamins-Minerals (MULTIVITAMIN ADULT PO) Take 1 tablet by mouth daily.    Historical Provider, MD  ondansetron (ZOFRAN) 4 MG tablet Take 1 tablet (4 mg total) by mouth every 6 (six) hours. 11/28/14   Delos Haring, PA-C  oxyCODONE-acetaminophen (PERCOCET/ROXICET) 5-325 MG per tablet Take 1-2 tablets by mouth every 6 (six) hours as needed for severe pain (moderate to severe pain (when tolerating fluids)). Patient not taking: Reported on 10/16/2014 09/18/14   Osborne Oman, MD  traMADol (ULTRAM) 50 MG tablet Take 1 tablet (50 mg total) by mouth every 6 (six) hours as needed. 11/28/14   Tiffany Carlota Raspberry, PA-C   BP 122/69 mmHg  Pulse 88  Temp(Src) 98.5 F (36.9 C) (Oral)  Resp 18  SpO2 99%  LMP 08/22/2014 (Exact Date)   Physical Exam  Constitutional: She appears well-developed and well-nourished.  HENT:  Head: Normocephalic and atraumatic.  Eyes: Conjunctivae are normal. Right eye exhibits no discharge. Left eye exhibits no discharge.  Neck: Normal range of motion. Neck supple.  Cardiovascular: Normal rate, regular rhythm and normal heart sounds.   Pulmonary/Chest: Effort normal and breath sounds normal.  Abdominal: Soft. There is tenderness (mild suprapubic). There is no rebound and no guarding.  Genitourinary: There is rash (erythema) on the right labia. There is no tenderness on the right labia. There is rash (erythema) on the left labia. There is no tenderness on the left labia. Cervix exhibits no motion tenderness. Right adnexum displays no mass and no tenderness. Left adnexum displays no mass and no tenderness. No tenderness or bleeding in the vagina. Vaginal discharge (mild white) found.   Neurological: She is alert.  Skin: Skin is warm and dry.  Psychiatric: She has a normal mood and affect.  Nursing note and vitals reviewed.   ED Course  Procedures (including critical care time) Labs Review Labs Reviewed  URINALYSIS, ROUTINE W REFLEX MICROSCOPIC (NOT AT [facility]) - Abnormal; Notable for the following:    APPearance CLOUDY (*)    Bilirubin Urine SMALL (*)    Leukocytes, UA SMALL (*)    All other components within normal limits  URINE MICROSCOPIC-ADD ON - Abnormal; Notable for the following:    Squamous Epithelial / LPF FEW (*)    Bacteria, UA FEW (*)    All other components within normal limits  WET PREP, GENITAL  GC/CHLAMYDIA PROBE AMP ([place]) NOT AT [facility]    Imaging Review No results found. I have personally reviewed and evaluated these images and lab results as part of my medical decision-making.   EKG Interpretation None      8:34 AM Patient seen and examined.  Work-up initiated. Pelvic exam performed by H[place]cock PA-S2 with nurse chaperone under my supervision.   Vital signs reviewed and are as follows: BP 122/69 mmHg  Pulse 88  Temp(Src) 98.5 F (36.9 C) (Oral)  Resp 18  SpO2 99%  LMP 08/22/2014 (Exact Date)  10:33 AM patient informed of results. Will treat for cutaneous candidal vulvovaginitis. Additional tramadol for pain and irritation. Patient has an appointment with her GYN in 3 days and she is encouraged to keep this. No UTI noted.  Patient counseled on use of narcotic pain medications. Counseled not to combine these medications with others containing tylenol. Urged not to drink alcohol, drive, or perform any other activities that requires focus while taking these medications. The patient verbalizes understanding and agrees with the plan.   MDM   Final diagnoses:  Vaginal pain   Patient with vaginal irritation, mostly external. Wet prep is negative. UA is negative. Will treat with topical anti-fungals. If this is not effective, she  can speak with her GYN during reevaluation in 3 days.   Carlisle Cater, PA-C 01/13/15 Jessie, MD 01/13/15 [phone]

## 2015-01-13 NOTE — Discharge Instructions (Signed)
Please read and follow all provided instructions.  Your diagnoses today include:  1. Vaginal pain    Tests performed today include:  Vital signs. See below for your results today.   Medications prescribed:   Nystatin - medication for skin yeast infection  Tramadol - narcotic-like pain medication  DO NOT drive or perform any activities that require you to be awake and alert because this medicine can make you drowsy.   Home care instructions:  Follow any educational materials contained in this packet.  Follow-up instructions: Please follow-up with your primary care provider as needed for further evaluation of your symptoms.  Return instructions:   Please return to the Emergency Department if you experience worsening symptoms.   Please return if you have any other emergent concerns.  Additional Information:  Your vital signs today were: BP 122/69 mmHg   Pulse 88   Temp(Src) 98.5 F (36.9 C) (Oral)   Resp 18   SpO2 99%   LMP 08/22/2014 (Exact Date) If your blood pressure (BP) was elevated above 135/85 this visit, please have this repeated by your doctor within one month. ---------------

## 2015-01-13 NOTE — ED Notes (Signed)
Yeast infection for last week and was using otc medication and now with extreme vaginal tenderness that has spread into vaginal area.  No bleeding. LMP:  Hysterectomy.

## 2015-01-14 LAB — GC/CHLAMYDIA PROBE AMP (~~LOC~~) NOT AT ARMC
Chlamydia: NEGATIVE
Neisseria Gonorrhea: NEGATIVE

## 2015-01-16 ENCOUNTER — Ambulatory Visit (INDEPENDENT_AMBULATORY_CARE_PROVIDER_SITE_OTHER): Payer: Medicaid Other | Admitting: Obstetrics & Gynecology

## 2015-01-16 ENCOUNTER — Encounter: Payer: Self-pay | Admitting: Obstetrics & Gynecology

## 2015-01-16 VITALS — BP 127/71 | HR 71 | Temp 98.2°F | Ht 66.0 in | Wt 224.7 lb

## 2015-01-16 DIAGNOSIS — B3731 Acute candidiasis of vulva and vagina: Secondary | ICD-10-CM

## 2015-01-16 DIAGNOSIS — Z1231 Encounter for screening mammogram for malignant neoplasm of breast: Secondary | ICD-10-CM

## 2015-01-16 DIAGNOSIS — B373 Candidiasis of vulva and vagina: Secondary | ICD-10-CM

## 2015-01-16 LAB — POCT URINALYSIS DIP (DEVICE)
BILIRUBIN URINE: NEGATIVE
Glucose, UA: NEGATIVE mg/dL
HGB URINE DIPSTICK: NEGATIVE
Ketones, ur: NEGATIVE mg/dL
NITRITE: NEGATIVE
Protein, ur: NEGATIVE mg/dL
Specific Gravity, Urine: 1.02 (ref 1.005–1.030)
Urobilinogen, UA: 0.2 mg/dL (ref 0.0–1.0)
pH: 5.5 (ref 5.0–8.0)

## 2015-01-16 MED ORDER — FLUCONAZOLE 150 MG PO TABS: 150.0000 mg | ORAL_TABLET | ORAL | Status: DC

## 2015-01-16 NOTE — Patient Instructions (Signed)
Yeast Vaginitis Vaginitis in a soreness, swelling and redness (inflammation) of the vagina and vulva. Monilial vaginitis is not a sexually transmitted infection. CAUSES  Yeast vaginitis is caused by yeast (candida) that is normally found in your vagina. With a yeast infection, the candida has overgrown in number to a point that upsets the chemical balance. SYMPTOMS   White, thick vaginal discharge.  Swelling, itching, redness and irritation of the vagina and possibly the lips of the vagina (vulva).  Burning or painful urination.  Painful intercourse. DIAGNOSIS  Things that may contribute to monilial vaginitis are:  Postmenopausal and virginal states.  Pregnancy.  Infections.  Being tired, sick or stressed, especially if you had monilial vaginitis in the past.  Diabetes. Good control will help lower the chance.  Birth control pills.  Tight fitting garments.  Using bubble bath, feminine sprays, douches or deodorant tampons.  Taking certain medications that kill germs (antibiotics).  Sporadic recurrence can occur if you become ill. TREATMENT  Your caregiver will give you medication.  There are several kinds of anti monilial vaginal creams and suppositories specific for monilial vaginitis. For recurrent yeast infections, use a suppository or cream in the vagina 2 times a week, or as directed.  Anti-monilial or steroid cream for the itching or irritation of the vulva may also be used. Get your caregiver's permission.  Painting the vagina with methylene blue solution may help if the monilial cream does not work.  Eating yogurt may help prevent monilial vaginitis. HOME CARE INSTRUCTIONS   Finish all medication as prescribed.  Do not have sex until treatment is completed or after your caregiver tells you it is okay.  Take warm sitz baths.  Do not douche.  Do not use tampons, especially scented ones.  Wear cotton underwear.  Avoid tight pants and panty hose.  Tell  your sexual partner that you have a yeast infection. They should go to their caregiver if they have symptoms such as mild rash or itching.  Your sexual partner should be treated as well if your infection is difficult to eliminate.  Practice safer sex. Use condoms.  Some vaginal medications cause latex condoms to fail. Vaginal medications that harm condoms are:  Cleocin cream.  Butoconazole (Femstat).  Terconazole (Terazol) vaginal suppository.  Miconazole (Monistat) (may be purchased over the counter). SEEK MEDICAL CARE IF:   You have a temperature by mouth above 102 F (38.9 C).  The infection is getting worse after 2 days of treatment.  The infection is not getting better after 3 days of treatment.  You develop blisters in or around your vagina.  You develop vaginal bleeding, and it is not your menstrual period.  You have pain when you urinate.  You develop intestinal problems.  You have pain with sexual intercourse.   This information is not intended to replace advice given to you by your health care provider. Make sure you discuss any questions you have with your health care provider.   Document Released: 01/06/2005 Document Revised: 06/21/2011 Document Reviewed: 09/30/2014 Elsevier Interactive Patient Education [employer].

## 2015-01-16 NOTE — Progress Notes (Signed)
CLINIC ENCOUNTER NOTE  History:  39 y.o. [id number] here today for abnormal vaginal discharge and dyspareunia x many weeks.  She is s/p LAVH on 09/17/14. She denies any abnormal vaginal bleeding, pelvic pain or other concerns.   Past Medical History  Diagnosis Date  . Rubella     5 months old  . Varicella     79 1/39 years old  . Anxiety   . Depression   . Vaginal delivery 1997, 1999, 2002, 2006, 2008, 2012  . Anemia     blood transfusion 09/14/10  . Iron deficiency anemia   . 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 Arthritis     "knees" (07/02/2014)  . Sleep apnea     Past Surgical History  Procedure Laterality Date  . Roux-en-y gastric bypass  2004  . Dilation and evacuation N/A 07/13/2013    Procedure: DILATATION AND EVACUATION;  Surgeon: Woodroe Mode, MD;  Location: [facility];  Service: Gynecology;  Laterality: N/A;  . Laparoscopic bilateral salpingectomy Bilateral 10/10/2013    Procedure: LAPAROSCOPIC BILATERAL SALPINGECTOMY;  Surgeon: Osborne Oman, MD;  Location: [facility];  Service: Gynecology;  Laterality: Bilateral;  with single site port  . Cholecystectomy open  2007  . Salpingoophorectomy Bilateral 08/2013  . Dilation and curettage of uterus  07/2013    "not a D&E"  . Laparoscopic assisted vaginal hysterectomy N/A 09/17/2014    Procedure: DIAGNOSTIC LAPAROSCOPIC, VAGINAL HYSTERECTOMY;  Surgeon: Osborne Oman, MD;  Location: [facility];  Service: Gynecology;  Laterality: N/A;  . Abdominal hysterectomy      The following portions of the patient's history were reviewed and updated as appropriate: allergies, current medications, past family history, past medical history, past social history, past surgical history and problem list.    Review of Systems:  Pertinent items noted in HPI and remainder of comprehensive ROS otherwise negative.  Objective:  Physical Exam BP 127/71 mmHg  Pulse 71  Temp(Src) 98.2 F  (36.8 C)  Ht 5\' 6"  (1.676 m)  Wt 224 lb 11.2 oz (101.923 kg)  BMI 36.28 kg/m2  LMP 08/22/2014 (Exact Date) CONSTITUTIONAL: Well-developed, well-nourished female in no acute distress.  HENT:  Normocephalic, atraumatic. External right and left ear normal. Oropharynx is clear and moist EYES: Conjunctivae and EOM are normal. Pupils are equal, round, and reactive to light. No scleral icterus.  NECK: Normal range of motion, supple, no masses SKIN: Skin is warm and dry. No rash noted. Not diaphoretic. No erythema. No pallor. [place]: Alert and oriented to person, place, and time. Normal reflexes, muscle tone coordination. No cranial nerve deficit noted. PSYCHIATRIC: Normal mood and affect. Normal behavior. Normal judgment and thought content. CARDIOVASCULAR: Normal heart rate noted RESPIRATORY: Effort and breath sounds normal, no problems with respiration noted ABDOMEN: Soft, no distention noted.   PELVIC: Normal appearing external genitalia; normal appearing vaginal mucosa and healed vaginal cuff. Thick, yellow-white, malodorous discharge noted, wet prep obtained.   MUSCULOSKELETAL: Normal range of motion. No edema noted.   Assessment & Plan:  Presumed yeast vaginitis - Wet prep, genital; will follow up results and manage accordingly. - fluconazole (DIFLUCAN) 150 MG tablet; Take 1 tablet (150 mg total) by mouth every 3 (three) days. For three doses  Dispense: 3 tablet; Refill: 3 presumptively prescribed  Routine preventative health maintenance measures emphasized. Mammogram ordered Please refer to After Visit Summary for other counseling recommendations.    Total  face-to-face time with patient: 15 minutes. Over 50% of encounter was spent on counseling and coordination of care.   Verita Schneiders, MD, [place] Attending Obstetrician & Gynecologist, [place] for [facility]

## 2015-01-16 NOTE — Progress Notes (Signed)
Here for vaginal concerns- pain with intercourse and then pain afterwards. Was also treated for yeast infection perineal area. Had taken monistat 3 day treatment before that.

## 2015-01-17 LAB — WET PREP, GENITAL
CLUE CELLS WET PREP: NONE SEEN
TRICH WET PREP: NONE SEEN

## 2015-04-03 ENCOUNTER — Emergency Department (HOSPITAL_COMMUNITY): Payer: Medicaid Other

## 2015-04-03 ENCOUNTER — Emergency Department (HOSPITAL_COMMUNITY)
Admission: EM | Admit: 2015-04-03 | Discharge: 2015-04-03 | Disposition: A | Discharge status: Home / Self Care | Payer: Medicaid Other | Attending: Emergency Medicine | Admitting: Emergency Medicine

## 2015-04-03 ENCOUNTER — Encounter (HOSPITAL_COMMUNITY): Payer: Self-pay | Admitting: *Deleted

## 2015-04-03 DIAGNOSIS — R112 Nausea with vomiting, unspecified: Secondary | ICD-10-CM | POA: Insufficient documentation

## 2015-04-03 DIAGNOSIS — D509 Iron deficiency anemia, unspecified: Secondary | ICD-10-CM | POA: Insufficient documentation

## 2015-04-03 DIAGNOSIS — M170 Bilateral primary osteoarthritis of knee: Secondary | ICD-10-CM | POA: Diagnosis not present

## 2015-04-03 DIAGNOSIS — R42 Dizziness and giddiness: Secondary | ICD-10-CM | POA: Insufficient documentation

## 2015-04-03 DIAGNOSIS — R55 Syncope and collapse: Secondary | ICD-10-CM | POA: Diagnosis not present

## 2015-04-03 DIAGNOSIS — Z8669 Personal history of other diseases of the nervous system and sense organs: Secondary | ICD-10-CM | POA: Diagnosis not present

## 2015-04-03 DIAGNOSIS — Z79899 Other long term (current) drug therapy: Secondary | ICD-10-CM | POA: Diagnosis not present

## 2015-04-03 DIAGNOSIS — F419 Anxiety disorder, unspecified: Secondary | ICD-10-CM | POA: Diagnosis not present

## 2015-04-03 DIAGNOSIS — F17210 Nicotine dependence, cigarettes, uncomplicated: Secondary | ICD-10-CM | POA: Insufficient documentation

## 2015-04-03 DIAGNOSIS — M779 Enthesopathy, unspecified: Secondary | ICD-10-CM | POA: Diagnosis not present

## 2015-04-03 DIAGNOSIS — F329 Major depressive disorder, single episode, unspecified: Secondary | ICD-10-CM | POA: Diagnosis not present

## 2015-04-03 DIAGNOSIS — M7752 Other enthesopathy of left foot: Secondary | ICD-10-CM

## 2015-04-03 DIAGNOSIS — R110 Nausea: Secondary | ICD-10-CM

## 2015-04-03 DIAGNOSIS — Z8619 Personal history of other infectious and parasitic diseases: Secondary | ICD-10-CM | POA: Insufficient documentation

## 2015-04-03 LAB — HEPATIC FUNCTION PANEL
ALT: 10 U/L — ABNORMAL LOW (ref 14–54)
AST: 19 U/L (ref 15–41)
Albumin: 3.6 g/dL (ref 3.5–5.0)
Alkaline Phosphatase: 64 U/L (ref 38–126)
BILIRUBIN TOTAL: 0.4 mg/dL (ref 0.3–1.2)
Total Protein: 6.7 g/dL (ref 6.5–8.1)

## 2015-04-03 LAB — BASIC METABOLIC PANEL
ANION GAP: 8 (ref 5–15)
BUN: 12 mg/dL (ref 6–20)
CO2: 23 mmol/L (ref 22–32)
CREATININE: 0.8 mg/dL (ref 0.44–1.00)
Calcium: 9.1 mg/dL (ref 8.9–10.3)
Chloride: 107 mmol/L (ref 101–111)
GLUCOSE: 122 mg/dL — AB (ref 65–99)
Potassium: 3.7 mmol/L (ref 3.5–5.1)
Sodium: 138 mmol/L (ref 135–145)

## 2015-04-03 LAB — CBC
HCT: 31.6 % — ABNORMAL LOW (ref 36.0–46.0)
Hemoglobin: 8.8 g/dL — ABNORMAL LOW (ref 12.0–15.0)
MCH: 19 pg — ABNORMAL LOW (ref 26.0–34.0)
MCHC: 27.8 g/dL — ABNORMAL LOW (ref 30.0–36.0)
MCV: 68.4 fL — AB (ref 78.0–100.0)
PLATELETS: 243 10*3/uL (ref 150–400)
RBC: 4.62 MIL/uL (ref 3.87–5.11)
RDW: 18 % — AB (ref 11.5–15.5)
WBC: 12.4 10*3/uL — AB (ref 4.0–10.5)

## 2015-04-03 LAB — URINALYSIS, ROUTINE W REFLEX MICROSCOPIC
Glucose, UA: NEGATIVE mg/dL
Hgb urine dipstick: NEGATIVE
KETONES UR: 15 mg/dL — AB
LEUKOCYTES UA: NEGATIVE
NITRITE: NEGATIVE
PROTEIN: NEGATIVE mg/dL
Specific Gravity, Urine: 1.029 (ref 1.005–1.030)
pH: 5 (ref 5.0–8.0)

## 2015-04-03 LAB — LIPASE, BLOOD: LIPASE: 37 U/L (ref 11–51)

## 2015-04-03 LAB — I-STAT TROPONIN, ED: TROPONIN I, POC: 0 ng/mL (ref 0.00–0.08)

## 2015-04-03 MED ORDER — ONDANSETRON 8 MG PO TBDP: 8.0000 mg | ORAL_TABLET | Freq: Three times a day (TID) | ORAL | Status: DC | PRN

## 2015-04-03 MED ORDER — MECLIZINE HCL 25 MG PO TABS
50.0000 mg | ORAL_TABLET | Freq: Once | ORAL | Status: AC
  Administered 2015-04-03: 50 mg via ORAL
  Filled 2015-04-03: qty 2

## 2015-04-03 MED ORDER — MECLIZINE HCL 50 MG PO TABS: 50.0000 mg | ORAL_TABLET | Freq: Three times a day (TID) | ORAL | Status: DC | PRN

## 2015-04-03 MED ORDER — ONDANSETRON HCL 4 MG/2ML IJ SOLN
4.0000 mg | Freq: Once | INTRAMUSCULAR | Status: AC
  Administered 2015-04-03: 4 mg via INTRAVENOUS
  Filled 2015-04-03: qty 2

## 2015-04-03 MED ORDER — SODIUM CHLORIDE 0.9 % IV BOLUS (SEPSIS)
1000.0000 mL | Freq: Once | INTRAVENOUS | Status: AC
  Administered 2015-04-03: 1000 mL via INTRAVENOUS

## 2015-04-03 NOTE — ED Provider Notes (Signed)
CSN: [id number]     Arrival date & time 04/03/15  0704 History   First MD Initiated Contact with Patient 04/03/15 [phone]     Chief Complaint  Patient presents with  . Near Syncope     (Consider location/radiation/quality/duration/timing/severity/associated sxs/prior Treatment) HPI Carrie Douglas is a 39 y.o. female with history of anemia, presents to emergency department complaining of near syncopal episode, nausea, vomiting. Patient states she was up this morning, cooking breakfast, when suddenly became nauseated, diaphoretic, dizzy. She denies loss of consciousness, but states she felt close to fainting. She states shortly after that she had an episode of nausea, vomiting, diarrhea. She continues to feel weak and nauseated. She denies chest pain or sob, denies abdominal pain. No recent illnesses. She admits to problems with left ankle pain, right knee pain, for which she has seen her doctor and was prescribed Voltaren gel. Patient states she normally puts it on just her ankle, however yesterday she put it on her knee as well and believes she may have had side effects from that. She denies any recent ill contacts. No recent travel or surgeries. Denies any swelling or pain in her calves. No tx prior to coming in.   Past Medical History  Diagnosis Date  . Rubella     5 months old  . Varicella     61 1/39 years old  . Anxiety   . Depression   . Vaginal delivery 1997, 1999, 2002, 2006, 2008, 2012  . Anemia     blood transfusion 09/14/10  . Iron deficiency anemia   . 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 Arthritis     "knees" (07/02/2014)  . Sleep apnea    Past Surgical History  Procedure Laterality Date  . Roux-en-y gastric bypass  2004  . Dilation and evacuation N/A 07/13/2013    Procedure: DILATATION AND EVACUATION;  Surgeon: Woodroe Mode, MD;  Location: [facility];  Service: Gynecology;  Laterality: N/A;  . Laparoscopic  bilateral salpingectomy Bilateral 10/10/2013    Procedure: LAPAROSCOPIC BILATERAL SALPINGECTOMY;  Surgeon: Osborne Oman, MD;  Location: [facility];  Service: Gynecology;  Laterality: Bilateral;  with single site port  . Cholecystectomy open  2007  . Salpingoophorectomy Bilateral 08/2013  . Dilation and curettage of uterus  07/2013    "not a D&E"  . Laparoscopic assisted vaginal hysterectomy N/A 09/17/2014    Procedure: DIAGNOSTIC LAPAROSCOPIC, VAGINAL HYSTERECTOMY;  Surgeon: Osborne Oman, MD;  Location: [facility];  Service: Gynecology;  Laterality: N/A;  . Abdominal hysterectomy     Family History  Problem Relation Age of Onset  . Lung cancer Father    Social History  Substance Use Topics  . Smoking status: Current Every Day Smoker -- 0.25 packs/day for 5 years    Types: Cigarettes  . Smokeless tobacco: Never Used  . Alcohol Use: No   OB History    Gravida Para Term Preterm AB TAB SAB Ectopic Multiple Living   6 6 6  0 0 0 0 0 0 6     Review of Systems  Constitutional: Negative for fever and chills.  HENT: Negative for congestion.   Respiratory: Negative for cough, chest tightness and shortness of breath.   Cardiovascular: Negative for chest pain, palpitations and leg swelling.  Gastrointestinal: Positive for nausea, vomiting and diarrhea. Negative for abdominal pain.  Genitourinary: Negative for dysuria and flank pain.  Musculoskeletal: Positive for  joint swelling and arthralgias. Negative for myalgias, neck pain and neck stiffness.  Skin: Negative for rash.  Neurological: Positive for dizziness and light-headedness. Negative for syncope, weakness and headaches.  All other systems reviewed and are negative.     Allergies  Codeine and Other  Home Medications   Prior to Admission medications   Medication Sig Start Date End Date Taking? Authorizing Provider  acetaminophen (TYLENOL) 500 MG tablet Take 1,000 mg by mouth every 6 (six) hours as needed for moderate pain.     Historical Provider, MD  calcium-vitamin D (OSCAL WITH D) 500-200 MG-UNIT per tablet Take 1 tablet by mouth daily with breakfast.    Historical Provider, MD  cetirizine (ZYRTEC) 10 MG tablet Take 10 mg by mouth daily as needed for allergies.  06/28/14   Historical Provider, MD  ferrous sulfate 325 (65 FE) MG tablet Take 1 tablet (325 mg total) by mouth 2 (two) times daily with a meal. 07/03/14   Domenic Polite, MD  fluconazole (DIFLUCAN) 150 MG tablet Take 1 tablet (150 mg total) by mouth every 3 (three) days. For three doses 01/16/15   Osborne Oman, MD  ibuprofen (ADVIL,MOTRIN) 200 MG tablet Take 400 mg by mouth every 6 (six) hours as needed for moderate pain.    Historical Provider, MD  ibuprofen (ADVIL,MOTRIN) 600 MG tablet  10/09/14   Historical Provider, MD  Multiple Vitamins-Minerals (MULTIVITAMIN ADULT PO) Take 1 tablet by mouth daily.    Historical Provider, MD  nystatin cream (MYCOSTATIN) Apply to affected area 2 times daily 01/13/15   Carlisle Cater, PA-C  traMADol (ULTRAM) 50 MG tablet Take 1 tablet (50 mg total) by mouth every 6 (six) hours as needed. 01/13/15   Carlisle Cater, PA-C   BP 114/69 mmHg  Pulse 83  Temp(Src) 99.1 F (37.3 C) (Oral)  Resp 16  Ht 5\' 6"  (1.676 m)  Wt 102.059 kg  BMI 36.33 kg/m2  SpO2 100%  LMP 08/22/2014 (Exact Date) Physical Exam  Constitutional: She is oriented to person, place, and time. She appears well-developed and well-nourished. No distress.  HENT:  Head: Normocephalic.  Eyes: Conjunctivae are normal. Pupils are equal, round, and reactive to light.  Neck: Normal range of motion. Neck supple.  Cardiovascular: Normal rate, regular rhythm and normal heart sounds.   Pulmonary/Chest: Effort normal and breath sounds normal. No respiratory distress. She has no wheezes. She has no rales.  Abdominal: Soft. Bowel sounds are normal. She exhibits no distension. There is no tenderness. There is no rebound.  Musculoskeletal: She exhibits no edema.  Mild  swelling to the medial left ankle joint. Mainly over her posterior tibial tendons in the arch of the foot. Full range motion of the ankle. Dorsal pedal pulse intact. Normal exam of the right knee. No lower leg swelling bilaterally, no calf tenderness bilaterally.  Neurological: She is alert and oriented to person, place, and time.  Skin: Skin is warm and dry.  Psychiatric: She has a normal mood and affect. Her behavior is normal.  Nursing note and vitals reviewed.   ED Course  Procedures (including critical care time) Labs Review Labs Reviewed  BASIC METABOLIC PANEL - Abnormal; Notable for the following:    Glucose, Bld 122 (*)    All other components within normal limits  CBC - Abnormal; Notable for the following:    WBC 12.4 (*)    Hemoglobin 8.8 (*)    HCT 31.6 (*)    MCV 68.4 (*)    MCH 19.0 (*)  MCHC 27.8 (*)    RDW 18.0 (*)    All other components within normal limits  URINALYSIS, ROUTINE W REFLEX MICROSCOPIC (NOT AT [facility]) - Abnormal; Notable for the following:    Color, Urine AMBER (*)    APPearance CLOUDY (*)    Bilirubin Urine SMALL (*)    Ketones, ur 15 (*)    All other components within normal limits  HEPATIC FUNCTION PANEL - Abnormal; Notable for the following:    ALT 10 (*)    Bilirubin, Direct <0.1 (*)    All other components within normal limits  LIPASE, BLOOD  I-STAT TROPOININ, ED    Imaging Review Dg Ankle Complete Left  04/03/2015  CLINICAL DATA:  Left ankle pain and swelling for 3 weeks, initial encounter EXAM: LEFT ANKLE COMPLETE - 3+ VIEW COMPARISON:  None. FINDINGS: There is no evidence of fracture, dislocation, or joint effusion. There is no evidence of arthropathy or other focal bone abnormality. Soft tissues are unremarkable. IMPRESSION: No acute abnormality seen. Electronically Signed   By: Inez Catalina M.D.   On: 04/03/2015 10:23   I have personally reviewed and evaluated these images and lab results as part of my medical decision-making.   EKG  Interpretation   Date/Time:  Thursday April 03 2015 07:19:19 EST Ventricular Rate:  82 PR Interval:  154 QRS Duration: 97 QT Interval:  380 QTC Calculation: 444 R Axis:   53 Text Interpretation:  Sinus rhythm since last tracing no significant  change Confirmed by BELFI  MD, MELANIE ([id number]) on 04/03/2015 7:22:21 AM      MDM   Final diagnoses:  Near syncope  Nausea  Tendonitis of ankle, left    patient with near syncopal episode followed by episode of nausea, vomiting, diarrhea. Patient's vital signs are normal at this time. She is nontoxic appearing. Abdomen benign. No chest pain. EKG with no acute changes. Will get blood work, urinalysis, Zofran ordered for nausea, we'll monitor.  9:24 AM Patient reassessed. She states she is feeling better, however when she tried to get up she still got dizzy. She was able to go to the bathroom. She states that when she stood up she felt like the room was spinning around. I will try some meclizine and will hydrate with 1 L of saline. Patient was not orthostatic. Patient's blood work did show elevated white count of 12.4 and hemoglobin of 8.8. Patient has history of hemoglobin as low as 6, for which she has had transfusions in the past as well as IV iron injection. Patient states she's currently taking oral iron but states that she will talk to her doctor about her hemoglobin. Patient has an appointment with her doctor tomorrow. Patient is also requesting x-ray of her left ankle, states she will also see her doctor for the ankle pain as well.  10:55 AM Pt was able to ambulate and feels much better after meclizine and fluids. Symptoms practically resolved. Home with zofran and meclizine. Follow up tomorrow as scheduled.   Filed Vitals:   04/03/15 1000 04/03/15 1102 04/03/15 1115 04/03/15 1126  BP: 121/80 103/61    Pulse: 67 82 91   Temp:    98.7 F (37.1 C)  TempSrc:    Oral  Resp: 24 18 22    Height:      Weight:      SpO2: 95% 100% 97%       Jeannett Senior, PA-C 04/03/15 [place], MD 04/03/15 [phone]

## 2015-04-03 NOTE — Discharge Instructions (Signed)
Take zofran as prescribed as needed for nausea. Meclizine for dizziness. Drink plenty of fluids. Rest today. Ice and elevate your ankle when at home. Follow up with your doctor tomorrow.    Near-Syncope Near-syncope (commonly known as near fainting) is sudden weakness, dizziness, or feeling like you might pass out. During an episode of near-syncope, you may also develop pale skin, have tunnel vision, or feel sick to your stomach (nauseous). Near-syncope may occur when getting up after sitting or while standing for a long time. It is caused by a sudden decrease in blood flow to the brain. This decrease can result from various causes or triggers, most of which are not serious. However, because near-syncope can sometimes be a sign of something serious, a medical evaluation is required. The specific cause is often not determined. HOME CARE INSTRUCTIONS  Monitor your condition for any changes. The following actions may help to alleviate any discomfort you are experiencing:  Have someone stay with you until you feel stable.  Lie down right away and prop your feet up if you start feeling like you might faint. Breathe deeply and steadily. Wait until all the symptoms have passed. Most of these episodes last only a few minutes. You may feel tired for several hours.   Drink enough fluids to keep your urine clear or pale yellow.   If you are taking blood pressure or heart medicine, get up slowly when seated or lying down. Take several minutes to sit and then stand. This can reduce dizziness.  Follow up with your health care provider as directed. SEEK IMMEDIATE MEDICAL CARE IF:   You have a severe headache.   You have unusual pain in the chest, abdomen, or back.   You are bleeding from the mouth or rectum, or you have black or tarry stool.   You have an irregular or very fast heartbeat.   You have repeated fainting or have seizure-like jerking during an episode.   You faint when sitting or  lying down.   You have confusion.   You have difficulty walking.   You have severe weakness.   You have vision problems.  MAKE SURE YOU:   Understand these instructions.  Will watch your condition.  Will get help right away if you are not doing well or get worse.   This information is not intended to replace advice given to you by your health care provider. Make sure you discuss any questions you have with your health care provider.   Document Released: 03/29/2005 Document Revised: 04/03/2013 Document Reviewed: 09/01/2012 Elsevier Interactive Patient Education 2016 [employer]  Ankle Pain Ankle pain is a common symptom. The bones, cartilage, tendons, and muscles of the ankle joint perform a lot of work each day. The ankle joint holds your body weight and allows you to move around. Ankle pain can occur on either side or back of 1 or both ankles. Ankle pain may be sharp and burning or dull and aching. There may be tenderness, stiffness, redness, or warmth around the ankle. The pain occurs more often when a person walks or puts pressure on the ankle. CAUSES  There are many reasons ankle pain can develop. It is important to work with your caregiver to identify the cause since many conditions can impact the bones, cartilage, muscles, and tendons. Causes for ankle pain include:  Injury, including a break (fracture), sprain, or strain often due to a fall, sports, or a high-impact activity.  Swelling (inflammation) of a tendon (tendonitis).  Achilles tendon rupture.  Ankle instability after repeated sprains and strains.  Poor foot alignment.  Pressure on a nerve (tarsal tunnel syndrome).  Arthritis in the ankle or the lining of the ankle.  Crystal formation in the ankle (gout or pseudogout). DIAGNOSIS  A diagnosis is based on your medical history, your symptoms, results of your physical exam, and results of diagnostic tests. Diagnostic tests may include X-ray exams or a  computerized magnetic scan (magnetic resonance imaging, MRI). TREATMENT  Treatment will depend on the cause of your ankle pain and may include:  Keeping pressure off the ankle and limiting activities.  Using crutches or other walking support (a cane or brace).  Using rest, ice, compression, and elevation.  Participating in physical therapy or home exercises.  Wearing shoe inserts or special shoes.  Losing weight.  Taking medications to reduce pain or swelling or receiving an injection.  Undergoing surgery. HOME CARE INSTRUCTIONS   Only take over-the-counter or prescription medicines for pain, discomfort, or fever as directed by your caregiver.  Put ice on the injured area.  Put ice in a plastic bag.  Place a towel between your skin and the bag.  Leave the ice on for 15-20 minutes at a time, 03-04 times a day.  Keep your leg raised (elevated) when possible to lessen swelling.  Avoid activities that cause ankle pain.  Follow specific exercises as directed by your caregiver.  Record how often you have ankle pain, the location of the pain, and what it feels like. This information may be helpful to you and your caregiver.  Ask your caregiver about returning to work or sports and whether you should drive.  Follow up with your caregiver for further examination, therapy, or testing as directed. SEEK MEDICAL CARE IF:   Pain or swelling continues or worsens beyond 1 week.  You have an oral temperature above 102 F (38.9 C).  You are feeling unwell or have chills.  You are having an increasingly difficult time with walking.  You have loss of sensation or other new symptoms.  You have questions or concerns. MAKE SURE YOU:   Understand these instructions.  Will watch your condition.  Will get help right away if you are not doing well or get worse.   This information is not intended to replace advice given to you by your health care provider. Make sure you discuss  any questions you have with your health care provider.   Document Released: 09/16/2009 Document Revised: 06/21/2011 Document Reviewed: 10/29/2014 Elsevier Interactive Patient Education [employer].

## 2015-04-03 NOTE — ED Notes (Signed)
PA at bedside, PA ambulated pt.

## 2015-04-03 NOTE — ED Notes (Signed)
Pt presents via POV c/o near syncopal episode this AM when cooking.  Pt reports getting really hot and sweaty and felt like her knees were going to buckle, denies LOC.  Reports nausea and diarrhea after episode.  Reports hx anemia and similar episodes, denies blood in vomit or stool.  Pt a x 4, NAD.

## 2015-04-06 ENCOUNTER — Emergency Department (HOSPITAL_COMMUNITY)
Admission: EM | Admit: 2015-04-06 | Discharge: 2015-04-06 | Discharge status: Against Medical Advice | Payer: Medicaid Other | Attending: Emergency Medicine | Admitting: Emergency Medicine

## 2015-04-06 ENCOUNTER — Encounter (HOSPITAL_COMMUNITY): Payer: Self-pay | Admitting: Vascular Surgery

## 2015-04-06 DIAGNOSIS — R109 Unspecified abdominal pain: Secondary | ICD-10-CM | POA: Insufficient documentation

## 2015-04-06 DIAGNOSIS — F17210 Nicotine dependence, cigarettes, uncomplicated: Secondary | ICD-10-CM | POA: Insufficient documentation

## 2015-04-06 DIAGNOSIS — R197 Diarrhea, unspecified: Secondary | ICD-10-CM | POA: Diagnosis not present

## 2015-04-06 LAB — COMPREHENSIVE METABOLIC PANEL
ALT: 52 U/L (ref 14–54)
ANION GAP: 9 (ref 5–15)
AST: 47 U/L — AB (ref 15–41)
Albumin: 3.5 g/dL (ref 3.5–5.0)
Alkaline Phosphatase: 122 U/L (ref 38–126)
BUN: 11 mg/dL (ref 6–20)
CALCIUM: 8.9 mg/dL (ref 8.9–10.3)
CO2: 24 mmol/L (ref 22–32)
Chloride: 105 mmol/L (ref 101–111)
Creatinine, Ser: 0.75 mg/dL (ref 0.44–1.00)
GFR calc non Af Amer: >60 mL/min (ref >60)
GLUCOSE: 109 mg/dL — AB (ref 65–99)
POTASSIUM: 3.3 mmol/L — AB (ref 3.5–5.1)
Sodium: 138 mmol/L (ref 135–145)
TOTAL PROTEIN: 6.6 g/dL (ref 6.5–8.1)
Total Bilirubin: 0.3 mg/dL (ref 0.3–1.2)

## 2015-04-06 LAB — CBC
HEMATOCRIT: 29.3 % — AB (ref 36.0–46.0)
HEMOGLOBIN: 8.5 g/dL — AB (ref 12.0–15.0)
MCH: 19.4 pg — ABNORMAL LOW (ref 26.0–34.0)
MCHC: 29 g/dL — AB (ref 30.0–36.0)
MCV: 66.9 fL — ABNORMAL LOW (ref 78.0–100.0)
PLATELETS: 263 10*3/uL (ref 150–400)
RBC: 4.38 MIL/uL (ref 3.87–5.11)
RDW: 17.9 % — ABNORMAL HIGH (ref 11.5–15.5)
WBC: 5.4 10*3/uL (ref 4.0–10.5)

## 2015-04-06 LAB — LIPASE, BLOOD: Lipase: 18 U/L (ref 11–51)

## 2015-04-06 NOTE — ED Notes (Signed)
Pt reports to the ED for eval of abd pain and cramping and diarrhea. Pt reports she has been seen recently for similar symptoms but reports she has only gotten worse. Denies any blood in her stool. Pt denies any N/V, urinary symptoms, or vaginal bleeding or d/c. Pt A&Ox4, resp e/u, and skin warm and dry. Unknown fever but has had chills.

## 2015-04-06 NOTE — ED Notes (Signed)
This nurse called for patient x 3, no answer.

## 2015-04-08 ENCOUNTER — Emergency Department (HOSPITAL_COMMUNITY)
Admission: EM | Admit: 2015-04-08 | Discharge: 2015-04-09 | Disposition: A | Discharge status: Home / Self Care | Payer: Medicaid Other | Attending: Emergency Medicine | Admitting: Emergency Medicine

## 2015-04-08 ENCOUNTER — Encounter (HOSPITAL_COMMUNITY): Payer: Self-pay | Admitting: *Deleted

## 2015-04-08 ENCOUNTER — Emergency Department (HOSPITAL_COMMUNITY): Payer: Medicaid Other

## 2015-04-08 DIAGNOSIS — Z8669 Personal history of other diseases of the nervous system and sense organs: Secondary | ICD-10-CM | POA: Insufficient documentation

## 2015-04-08 DIAGNOSIS — F329 Major depressive disorder, single episode, unspecified: Secondary | ICD-10-CM | POA: Insufficient documentation

## 2015-04-08 DIAGNOSIS — M1990 Unspecified osteoarthritis, unspecified site: Secondary | ICD-10-CM | POA: Diagnosis not present

## 2015-04-08 DIAGNOSIS — R112 Nausea with vomiting, unspecified: Secondary | ICD-10-CM

## 2015-04-08 DIAGNOSIS — Z3202 Encounter for pregnancy test, result negative: Secondary | ICD-10-CM | POA: Diagnosis not present

## 2015-04-08 DIAGNOSIS — F419 Anxiety disorder, unspecified: Secondary | ICD-10-CM | POA: Insufficient documentation

## 2015-04-08 DIAGNOSIS — F17210 Nicotine dependence, cigarettes, uncomplicated: Secondary | ICD-10-CM | POA: Insufficient documentation

## 2015-04-08 DIAGNOSIS — K529 Noninfective gastroenteritis and colitis, unspecified: Secondary | ICD-10-CM | POA: Insufficient documentation

## 2015-04-08 DIAGNOSIS — Z79899 Other long term (current) drug therapy: Secondary | ICD-10-CM | POA: Diagnosis not present

## 2015-04-08 DIAGNOSIS — R1012 Left upper quadrant pain: Secondary | ICD-10-CM | POA: Diagnosis present

## 2015-04-08 DIAGNOSIS — D649 Anemia, unspecified: Secondary | ICD-10-CM | POA: Insufficient documentation

## 2015-04-08 DIAGNOSIS — D509 Iron deficiency anemia, unspecified: Secondary | ICD-10-CM | POA: Diagnosis not present

## 2015-04-08 DIAGNOSIS — R197 Diarrhea, unspecified: Secondary | ICD-10-CM

## 2015-04-08 LAB — COMPREHENSIVE METABOLIC PANEL
ALBUMIN: 3.6 g/dL (ref 3.5–5.0)
ALT: 24 U/L (ref 14–54)
ANION GAP: 9 (ref 5–15)
AST: 19 U/L (ref 15–41)
Alkaline Phosphatase: 86 U/L (ref 38–126)
BILIRUBIN TOTAL: 0.3 mg/dL (ref 0.3–1.2)
BUN: 6 mg/dL (ref 6–20)
CO2: 26 mmol/L (ref 22–32)
Calcium: 9.2 mg/dL (ref 8.9–10.3)
Chloride: 106 mmol/L (ref 101–111)
Creatinine, Ser: 0.82 mg/dL (ref 0.44–1.00)
GFR calc non Af Amer: >60 mL/min (ref >60)
GLUCOSE: 126 mg/dL — AB (ref 65–99)
POTASSIUM: 3.3 mmol/L — AB (ref 3.5–5.1)
SODIUM: 141 mmol/L (ref 135–145)
TOTAL PROTEIN: 7 g/dL (ref 6.5–8.1)

## 2015-04-08 LAB — CBC
HEMATOCRIT: 28.9 % — AB (ref 36.0–46.0)
HEMOGLOBIN: 8.3 g/dL — AB (ref 12.0–15.0)
MCH: 19.3 pg — ABNORMAL LOW (ref 26.0–34.0)
MCHC: 28.7 g/dL — AB (ref 30.0–36.0)
MCV: 67.4 fL — ABNORMAL LOW (ref 78.0–100.0)
Platelets: 345 10*3/uL (ref 150–400)
RBC: 4.29 MIL/uL (ref 3.87–5.11)
RDW: 17.9 % — ABNORMAL HIGH (ref 11.5–15.5)
WBC: 5.4 10*3/uL (ref 4.0–10.5)

## 2015-04-08 LAB — URINALYSIS, ROUTINE W REFLEX MICROSCOPIC
Glucose, UA: NEGATIVE mg/dL
Hgb urine dipstick: NEGATIVE
Ketones, ur: 15 mg/dL — AB
LEUKOCYTES UA: NEGATIVE
NITRITE: NEGATIVE
PH: 6 (ref 5.0–8.0)
Protein, ur: 30 mg/dL — AB
SPECIFIC GRAVITY, URINE: 1.034 — AB (ref 1.005–1.030)

## 2015-04-08 LAB — URINE MICROSCOPIC-ADD ON

## 2015-04-08 LAB — I-STAT BETA HCG BLOOD, ED (MC, WL, AP ONLY): I-stat hCG, quantitative: <5 m[IU]/mL (ref <5)

## 2015-04-08 LAB — LIPASE, BLOOD: Lipase: 25 U/L (ref 11–51)

## 2015-04-08 MED ORDER — CIPROFLOXACIN HCL 500 MG PO TABS: 500.0000 mg | ORAL_TABLET | Freq: Two times a day (BID) | ORAL | Status: AC

## 2015-04-08 MED ORDER — CIPROFLOXACIN HCL 500 MG PO TABS
500.0000 mg | ORAL_TABLET | Freq: Once | ORAL | Status: AC
  Administered 2015-04-08: 500 mg via ORAL
  Filled 2015-04-08: qty 1

## 2015-04-08 MED ORDER — BARIUM SULFATE 2.1 % PO SUSP
ORAL | Status: AC
  Filled 2015-04-08: qty 1

## 2015-04-08 MED ORDER — ONDANSETRON 4 MG PO TBDP
4.0000 mg | ORAL_TABLET | Freq: Once | ORAL | Status: AC
  Administered 2015-04-08: 4 mg via ORAL
  Filled 2015-04-08: qty 1

## 2015-04-08 MED ORDER — SODIUM CHLORIDE 0.9 % IV BOLUS (SEPSIS)
1000.0000 mL | Freq: Once | INTRAVENOUS | Status: AC
Start: 2015-04-08 — End: 2015-04-08
  Administered 2015-04-08: 1000 mL via INTRAVENOUS

## 2015-04-08 MED ORDER — HYDROCODONE-ACETAMINOPHEN 5-325 MG PO TABS
1.0000 | ORAL_TABLET | Freq: Once | ORAL | Status: AC
  Administered 2015-04-08: 1 via ORAL
  Filled 2015-04-08: qty 1

## 2015-04-08 MED ORDER — ONDANSETRON HCL 4 MG PO TABS: 4.0000 mg | ORAL_TABLET | Freq: Four times a day (QID) | ORAL | Status: DC

## 2015-04-08 MED ORDER — IOHEXOL 300 MG/ML  SOLN
100.0000 mL | Freq: Once | INTRAMUSCULAR | Status: AC | PRN
  Administered 2015-04-08: 100 mL via INTRAVENOUS

## 2015-04-08 MED ORDER — ONDANSETRON HCL 4 MG/2ML IJ SOLN
4.0000 mg | Freq: Once | INTRAMUSCULAR | Status: AC
Start: 2015-04-08 — End: 2015-04-08
  Administered 2015-04-08: 4 mg via INTRAVENOUS
  Filled 2015-04-08: qty 2

## 2015-04-08 MED ORDER — HYDROCODONE-ACETAMINOPHEN 5-325 MG PO TABS: 2.0000 | ORAL_TABLET | ORAL | Status: DC | PRN

## 2015-04-08 MED ORDER — HYDROMORPHONE HCL 1 MG/ML IJ SOLN
0.5000 mg | Freq: Once | INTRAMUSCULAR | Status: AC
  Administered 2015-04-08: 0.5 mg via INTRAVENOUS
  Filled 2015-04-08: qty 1

## 2015-04-08 NOTE — ED Notes (Signed)
abd pain diarrhea n v for one week.  She was here Thursday but did not stay to be seen.  She also has leg cramps.  lmp none

## 2015-04-08 NOTE — Discharge Instructions (Signed)
Diarrhea Diarrhea is watery poop (stool). It can make you feel weak, tired, thirsty, or give you a dry mouth (signs of dehydration). Watery poop is a sign of another problem, most often an infection. It often lasts 2-3 days. It can last longer if it is a sign of something serious. Take care of yourself as told by your doctor. HOME CARE   Drink 1 cup (8 ounces) of fluid each time you have watery poop.  Do not drink the following fluids:  Those that contain simple sugars (fructose, glucose, galactose, lactose, sucrose, maltose).  Sports drinks.  Fruit juices.  Whole milk products.  Sodas.  Drinks with caffeine (coffee, tea, soda) or alcohol.  Oral rehydration solution may be used if the doctor says it is okay. You may make your own solution. Follow this recipe:   - teaspoon table salt.   teaspoon baking soda.   teaspoon salt substitute containing potassium chloride.  1 tablespoons sugar.  1 liter (34 ounces) of water.  Avoid the following foods:  High fiber foods, such as raw fruits and vegetables.  Nuts, seeds, and whole grain breads and cereals.   Those that are sweetened with sugar alcohols (xylitol, sorbitol, mannitol).  Try eating the following foods:  Starchy foods, such as rice, toast, pasta, low-sugar cereal, oatmeal, baked potatoes, crackers, and bagels.  Bananas.  Applesauce.  Eat probiotic-rich foods, such as yogurt and milk products that are fermented.  Wash your hands well after each time you have watery poop.  Only take medicine as told by your doctor.  Take a warm bath to help lessen burning or pain from having watery poop. GET HELP RIGHT AWAY IF:   You cannot drink fluids without throwing up (vomiting).  You keep throwing up.  You have blood in your poop, or your poop looks black and tarry.  You do not pee (urinate) in 6-8 hours, or there is only a small amount of very dark pee.  You have belly (abdominal) pain that gets worse or stays  in the same spot (localizes).  You are weak, dizzy, confused, or light-headed.  You have a very bad headache.  Your watery poop gets worse or does not get better.  You have a fever or lasting symptoms for more than 2-3 days.  You have a fever and your symptoms suddenly get worse. MAKE SURE YOU:   Understand these instructions.  Will watch your condition.  Will get help right away if you are not doing well or get worse.   This information is not intended to replace advice given to you by your health care provider. Make sure you discuss any questions you have with your health care provider.   Document Released: 09/15/2007 Document Revised: 04/19/2014 Document Reviewed: 12/05/2011 Elsevier Interactive Patient Education 2016 Else[employer]  Colitis Colitis is inflammation of the colon. Colitis may last a short time (acute) or it may last a long time (chronic). CAUSES This condition may be caused by:  Viruses.  Bacteria.  Reactions to medicine.  Certain autoimmune diseases, such as Crohn disease or ulcerative colitis. SYMPTOMS Symptoms of this condition include:  Diarrhea.  Passing bloody or tarry stool.  Pain.  Fever.  Vomiting.  Tiredness (fatigue).  Weight loss.  Bloating.  Sudden increase in abdominal pain.  Having fewer bowel movements than usual. DIAGNOSIS This condition is diagnosed with a stool test or a blood test. You may also have other tests, including X-rays, a CT scan, or a colonoscopy. TREATMENT Treatment may  include:  Resting the bowel. This involves not eating or drinking for a period of time.  Fluids that are given through an IV tube.  Medicine for pain and diarrhea.  Antibiotic medicines.  Cortisone medicines.  Surgery. HOME CARE INSTRUCTIONS Eating and Drinking  Follow instructions from your health care provider about eating or drinking restrictions.  Drink enough fluid to keep your urine clear or pale yellow.  Work with  a dietitian to determine which foods cause your condition to flare up.  Avoid foods that cause flare-ups.  Eat a well-balanced diet. Medicines  Take over-the-counter and prescription medicines only as told by your health care provider.  If you were prescribed an antibiotic medicine, take it as told by your health care provider. Do not stop taking the antibiotic even if you start to feel better. General Instructions  Keep all follow-up visits as told by your health care provider. This is important. SEEK MEDICAL CARE IF:  Your symptoms do not go away.  You develop new symptoms. SEEK IMMEDIATE MEDICAL CARE IF:  You have a fever that does not go away with treatment.  You develop chills.  You have extreme weakness, fainting, or dehydration.  You have repeated vomiting.  You develop severe pain in your abdomen.  You pass bloody or tarry stool.   This information is not intended to replace advice given to you by your health care provider. Make sure you discuss any questions you have with your health care provider.   Document Released: 05/06/2004 Document Revised: 12/18/2014 Document Reviewed: 07/22/2014 Elsevier Interactive Patient Education 2016 Elsevier Inc.  Nausea and Vomiting Nausea means you feel sick to your stomach. Throwing up (vomiting) is a reflex where stomach contents come out of your mouth. HOME CARE   Take medicine as told by your doctor.  Do not force yourself to eat. However, you do need to drink fluids.  If you feel like eating, eat a normal diet as told by your doctor.  Eat rice, wheat, potatoes, bread, lean meats, yogurt, fruits, and vegetables.  Avoid high-fat foods.  Drink enough fluids to keep your pee (urine) clear or pale yellow.  Ask your doctor how to replace body fluid losses (rehydrate). Signs of body fluid loss (dehydration) include:  Feeling very thirsty.  Dry lips and mouth.  Feeling dizzy.  Dark pee.  Peeing less than  normal.  Feeling confused.  Fast breathing or heart rate. GET HELP RIGHT AWAY IF:   You have blood in your throw up.  You have black or bloody poop (stool).  You have a bad headache or stiff neck.  You feel confused.  You have bad belly (abdominal) pain.  You have chest pain or trouble breathing.  You do not pee at least once every 8 hours.  You have cold, clammy skin.  You keep throwing up after 24 to 48 hours.  You have a fever. MAKE SURE YOU:   Understand these instructions.  Will watch your condition.  Will get help right away if you are not doing well or get worse.   This information is not intended to replace advice given to you by your health care provider. Make sure you discuss any questions you have with your health care provider.   Document Released: 09/15/2007 Document Revised: 06/21/2011 Document Reviewed: 08/28/2010 Elsevier Interactive Patient Education [employer].

## 2015-04-08 NOTE — ED Notes (Signed)
Pt stable, ambulatory, states understanding of discharge instructions 

## 2015-04-08 NOTE — ED Provider Notes (Signed)
CSN: [id number]     Arrival date & time 04/08/15  1533 History   First MD Initiated Contact with Patient 04/08/15 1913     Chief Complaint  Patient presents with  . Abdominal Pain     (Consider location/radiation/quality/duration/timing/severity/associated sxs/prior Treatment) Patient is a 39 y.o. female presenting with abdominal pain. The history is provided by the patient.  Abdominal Pain Pain location:  LLQ and LUQ Pain quality: aching   Pain radiates to:  Does not radiate Pain severity:  Moderate Onset quality:  Gradual Duration:  1 week Timing:  Intermittent Progression:  Waxing and waning Chronicity:  New Context: previous surgery (Pt has history of gastric bypass)   Context: not medication withdrawal, not recent illness, not recent sexual activity, not recent travel, not retching, not sick contacts, not suspicious food intake and not trauma   Relieved by:  None tried Worsened by:  Palpation, eating and bowel movements Ineffective treatments:  None tried Associated symptoms: diarrhea, nausea and vomiting   Associated symptoms: no anorexia, no belching, no chest pain, no chills, no constipation, no cough, no dysuria, no fatigue, no fever, no flatus, no hematemesis, no hematochezia, no hematuria, no melena, no shortness of breath, no vaginal bleeding and no vaginal discharge   Risk factors: obesity   Risk factors: no recent hospitalization     Past Medical History  Diagnosis Date  . Rubella     5 months old  . Varicella     87 1/40 years old  . Anxiety   . Depression   . Vaginal delivery 1997, 1999, 2002, 2006, 2008, 2012  . Anemia     blood transfusion 09/14/10  . Iron deficiency anemia   . 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 Arthritis     "knees" (07/02/2014)  . Sleep apnea    Past Surgical History  Procedure Laterality Date  . Roux-en-y gastric bypass  2004  . Dilation and evacuation N/A  07/13/2013    Procedure: DILATATION AND EVACUATION;  Surgeon: Woodroe Mode, MD;  Location: [facility];  Service: Gynecology;  Laterality: N/A;  . Laparoscopic bilateral salpingectomy Bilateral 10/10/2013    Procedure: LAPAROSCOPIC BILATERAL SALPINGECTOMY;  Surgeon: Osborne Oman, MD;  Location: [facility];  Service: Gynecology;  Laterality: Bilateral;  with single site port  . Cholecystectomy open  2007  . Salpingoophorectomy Bilateral 08/2013  . Dilation and curettage of uterus  07/2013    "not a D&E"  . Laparoscopic assisted vaginal hysterectomy N/A 09/17/2014    Procedure: DIAGNOSTIC LAPAROSCOPIC, VAGINAL HYSTERECTOMY;  Surgeon: Osborne Oman, MD;  Location: [facility];  Service: Gynecology;  Laterality: N/A;  . Abdominal hysterectomy     Family History  Problem Relation Age of Onset  . Lung cancer Father    Social History  Substance Use Topics  . Smoking status: Current Every Day Smoker -- 0.25 packs/day for 5 years    Types: Cigarettes  . Smokeless tobacco: Never Used  . Alcohol Use: No   OB History    Gravida Para Term Preterm AB TAB SAB Ectopic Multiple Living   6 6 6  0 0 0 0 0 0 6     Review of Systems  Constitutional: Negative for fever, chills and fatigue.  HENT: Negative for congestion.   Eyes: Negative for pain.  Respiratory: Negative for cough and shortness of breath.   Cardiovascular: Negative for chest pain.  Gastrointestinal:  Positive for nausea, vomiting, abdominal pain and diarrhea. Negative for constipation, blood in stool, melena, hematochezia, abdominal distention, rectal pain, anorexia, flatus and hematemesis.  Genitourinary: Negative for dysuria, hematuria, vaginal bleeding and vaginal discharge.  Musculoskeletal: Negative for back pain, joint swelling and gait problem.  Neurological: Negative for dizziness.      Allergies  Codeine and Other  Home Medications   Prior to Admission medications   Medication Sig Start Date End Date Taking? Authorizing Provider   acetaminophen (TYLENOL) 500 MG tablet Take 1,000 mg by mouth every 6 (six) hours as needed for moderate pain.   Yes Historical Provider, MD  calcium-vitamin D (OSCAL WITH D) 500-200 MG-UNIT per tablet Take 1 tablet by mouth daily with breakfast.   Yes Historical Provider, MD  diclofenac sodium (VOLTAREN) 1 % GEL Apply 2 g topically 4 (four) times daily.   Yes Historical Provider, MD  ferrous sulfate 325 (65 FE) MG tablet Take 1 tablet (325 mg total) by mouth 2 (two) times daily with a meal. 07/03/14  Yes Domenic Polite, MD  ibuprofen (ADVIL,MOTRIN) 200 MG tablet Take 400 mg by mouth every 6 (six) hours as needed for moderate pain.   Yes Historical Provider, MD  meclizine (ANTIVERT) 50 MG tablet Take 1 tablet (50 mg total) by mouth 3 (three) times daily as needed. 04/03/15  Yes Tatyana Kirichenko, PA-C  Multiple Vitamins-Minerals (MULTIVITAMIN ADULT PO) Take 1 tablet by mouth daily.   Yes Historical Provider, MD  ondansetron (ZOFRAN ODT) 8 MG disintegrating tablet Take 1 tablet (8 mg total) by mouth every 8 (eight) hours as needed for nausea or vomiting. 04/03/15  Yes Tatyana Kirichenko, PA-C  ciprofloxacin (CIPRO) 500 MG tablet Take 1 tablet (500 mg total) by mouth 2 (two) times daily. 04/08/15 04/12/15  Esaw Grandchild, MD  HYDROcodone-acetaminophen (NORCO/VICODIN) 5-325 MG tablet Take 2 tablets by mouth every 4 (four) hours as needed. 04/08/15   Esaw Grandchild, MD  ondansetron (ZOFRAN) 4 MG tablet Take 1 tablet (4 mg total) by mouth every 6 (six) hours. 04/08/15   Esaw Grandchild, MD   BP 111/77 mmHg  Pulse 62  Temp(Src) 98.5 F (36.9 C) (Oral)  Resp 16  Ht 5\' 6"  (1.676 m)  Wt 100.699 kg  BMI 35.85 kg/m2  SpO2 99%  LMP 08/22/2014 (Exact Date) Physical Exam  Constitutional: She is oriented to person, place, and time. She appears well-developed and well-nourished. No distress.  HENT:  Head: Normocephalic and atraumatic.  Eyes: Conjunctivae and EOM are normal. Pupils are equal, round, and  reactive to light.  Neck: Normal range of motion.  Cardiovascular: Normal rate and regular rhythm.  Exam reveals friction rub. Exam reveals no gallop.   No murmur heard. Pulmonary/Chest: Effort normal and breath sounds normal. No respiratory distress. She has no wheezes. She has no rales. She exhibits no tenderness.  Abdominal: Soft. Normal appearance. She exhibits no distension and no mass. There is tenderness in the left upper quadrant and left lower quadrant. There is no rigidity, no rebound, no guarding, no CVA tenderness, no tenderness at McBurney's point and negative Murphy's sign. No hernia.  Musculoskeletal: Normal range of motion.  Neurological: She is alert and oriented to person, place, and time. No cranial nerve deficit.  Skin: Skin is warm and dry. She is not diaphoretic.  Psychiatric: She has a normal mood and affect. Her behavior is normal.    ED Course  Procedures (including critical care time) Labs Review Labs Reviewed  COMPREHENSIVE METABOLIC PANEL - Abnormal; Notable  for the following:    Potassium 3.3 (*)    Glucose, Bld 126 (*)    All other components within normal limits  CBC - Abnormal; Notable for the following:    Hemoglobin 8.3 (*)    HCT 28.9 (*)    MCV 67.4 (*)    MCH 19.3 (*)    MCHC 28.7 (*)    RDW 17.9 (*)    All other components within normal limits  URINALYSIS, ROUTINE W REFLEX MICROSCOPIC (NOT AT [facility]) - Abnormal; Notable for the following:    Color, Urine AMBER (*)    Specific Gravity, Urine 1.034 (*)    Bilirubin Urine SMALL (*)    Ketones, ur 15 (*)    Protein, ur 30 (*)    All other components within normal limits  URINE MICROSCOPIC-ADD ON - Abnormal; Notable for the following:    Squamous Epithelial / LPF 6-30 (*)    Bacteria, UA MANY (*)    Casts HYALINE CASTS (*)    All other components within normal limits  GASTROINTESTINAL PANEL BY PCR, STOOL (REPLACES STOOL CULTURE)  LIPASE, BLOOD  I-STAT BETA HCG BLOOD, ED ([facility], [facility], [facility] ONLY)     Imaging Review Ct Abdomen Pelvis W Contrast  04/08/2015  ADDENDUM REPORT: 04/08/2015 22:46 ADDENDUM: Please note there is a typographical error in the impression of the report. There is NO evidence of bowel obstruction. Electronically Signed   By: Anner Crete M.D.   On: 04/08/2015 22:46  04/08/2015  CLINICAL DATA:  39 year old female with lower abdominal pain radiating to the back. Diarrhea. EXAM: CT ABDOMEN AND PELVIS WITH CONTRAST TECHNIQUE: Multidetector CT imaging of the abdomen and pelvis was performed using the standard protocol following bolus administration of intravenous contrast. CONTRAST:  144mL OMNIPAQUE IOHEXOL 300 MG/ML  SOLN COMPARISON:  CT dated 09/06/2012 FINDINGS: The visualized lung bases are clear. No intra-abdominal free air or free fluid. Cholecystectomy. Mild biliary ductal dilatation, likely post cholecystectomy. Mild splenomegaly measuring 15 cm in greatest dimension The pancreas, and the left adrenal gland appear unremarkable. There is a grossly stable appearing 3.2 cm indeterminate right adrenal nodule, likely an adenoma. There is no hydronephrosis or nephrolithiasis. The visualized ureters and urinary bladder appear grossly unremarkable. Hysterectomy. 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 Oral contrast seen within the included portion of the stomach and multiple loops of distal small bowel. There is no evidence of bowel obstruction. There is a focal area of small bowel dilatation in the left upper abdomen adjacent to and ileo ileal anastomosis. There is apparent thickening and nodularity of the wall of this loop of bowel which may be related to postsurgical changes and scarring. Oral contrast however passes through this loop of bowel in the more distal small bowel without definite evidence of obstruction. The There is minimal prominence of the wall of the distal colon and sigmoid, possibly related to underdistention. There is no significant  pericolonic inflammatory changes. Colitis therefore is less likely. Clinical correlation is recommended. The appendix appears unremarkable. The visualized abdominal aorta and IVC appear patent. No portal venous gas identified. There is no lymphadenopathy. Midline vertical anterior pelvic wall incisional scar. The osseous structures otherwise appear unremarkable. IMPRESSION: Postsurgical changes of gastric bypass. A focally dilated loop of small bowel in the left upper abdomen and ileal anastomosis may represent a degree of anastomotic stricture evidence of obstruction. Minimal prominence of the distal colon likely related to underdistention. Mild colitis is not excluded. Clinical correlation is recommended. Electronically Signed:  By: Anner Crete M.D. On: 04/08/2015 21:08   I have personally reviewed and evaluated these images and lab results as part of my medical decision-making.   EKG Interpretation None      MDM   Final diagnoses:  Nausea vomiting and diarrhea  Gastroenteritis  Colitis    39 year old female with past medical history of gastric bypass surgery presents to the setting of one week of nausea, vomiting, diarrhea, abdominal pain. Patient reports this began gradually and due to continued symptoms she presented to the emergency department. Patient denies any fevers, chest pain, shortness of breath, recent sick contacts or recent travel. She denies any blood in her stool. The patient has been able to tolerate by mouth intake during this time.  Patient has mild tenderness to palpation of left upper and left lower quadrant. No signs of peritonitis on examination. Patient not tachycardic on arrival. No signs of fever.  In setting of findings we'll obtain abdominal laboratory analysis as well as CT abdomen and pelvis with contrast. Due to patient's previous surgery will check to ensure no complications from Roux-en-Y surgery. Additionally we'll give patient IV fluids, antiemetics and  pain medication.  No significant electrolyte abnormalities and no significant elevation in white blood cell count. Patient reported improvement in symptoms with fluids and pain medications. Patient did have bowel movement while in emergency department and GI pathogen panel was sent. CT scan which I discussed personally with the radiologist reveals no obvious obstruction or other complication from surgery. she does have signs consistent with likely colitis. In setting of patient's symptoms is likely colitis. Due to the length of symptoms will give patient one dose of ciprofloxacin in emergency department and discharged home with short course of ciprofloxacin. Additionally we'll discharge home with short course of pain and antiemetic medication. Patient will be contacted if GI pathogen panel reveals significant abnormality. Patient's given strict return precautions and stable at time of discharge. Patient and family in agreement with plan.  Attending has seen and evaluated patient and doctor Kathrynn Humble is in agreement with plan.    Esaw Grandchild, MD 04/08/15 [place], MD 04/10/15 [phone]

## 2015-04-09 LAB — GASTROINTESTINAL PANEL BY PCR, STOOL (REPLACES STOOL CULTURE)
ADENOVIRUS F40/41: NOT DETECTED
Astrovirus: NOT DETECTED
CAMPYLOBACTER SPECIES: DETECTED — AB
CRYPTOSPORIDIUM: NOT DETECTED
Cyclospora cayetanensis: NOT DETECTED
E. coli O157: NOT DETECTED
ENTAMOEBA HISTOLYTICA: NOT DETECTED
ENTEROAGGREGATIVE E COLI (EAEC): NOT DETECTED
ENTEROPATHOGENIC E COLI (EPEC): NOT DETECTED
Enterotoxigenic E coli (ETEC): NOT DETECTED
GIARDIA LAMBLIA: NOT DETECTED
Norovirus GI/GII: NOT DETECTED
PLESIMONAS SHIGELLOIDES: NOT DETECTED
Rotavirus A: NOT DETECTED
SALMONELLA SPECIES: NOT DETECTED
SHIGELLA/ENTEROINVASIVE E COLI (EIEC): NOT DETECTED
Sapovirus (I, II, IV, and V): NOT DETECTED
Shiga like toxin producing E coli (STEC): NOT DETECTED
VIBRIO CHOLERAE: NOT DETECTED
Vibrio species: NOT DETECTED
YERSINIA ENTEROCOLITICA: NOT DETECTED

## 2015-04-26 ENCOUNTER — Emergency Department (HOSPITAL_COMMUNITY)
Admission: EM | Admit: 2015-04-26 | Discharge: 2015-04-26 | Disposition: A | Discharge status: Home / Self Care | Payer: Medicaid Other | Attending: Emergency Medicine | Admitting: Emergency Medicine

## 2015-04-26 ENCOUNTER — Encounter (HOSPITAL_COMMUNITY): Payer: Self-pay

## 2015-04-26 DIAGNOSIS — Z79899 Other long term (current) drug therapy: Secondary | ICD-10-CM | POA: Insufficient documentation

## 2015-04-26 DIAGNOSIS — M1990 Unspecified osteoarthritis, unspecified site: Secondary | ICD-10-CM | POA: Diagnosis not present

## 2015-04-26 DIAGNOSIS — F419 Anxiety disorder, unspecified: Secondary | ICD-10-CM | POA: Diagnosis not present

## 2015-04-26 DIAGNOSIS — G8929 Other chronic pain: Secondary | ICD-10-CM

## 2015-04-26 DIAGNOSIS — Z8619 Personal history of other infectious and parasitic diseases: Secondary | ICD-10-CM | POA: Insufficient documentation

## 2015-04-26 DIAGNOSIS — D509 Iron deficiency anemia, unspecified: Secondary | ICD-10-CM | POA: Diagnosis not present

## 2015-04-26 DIAGNOSIS — F329 Major depressive disorder, single episode, unspecified: Secondary | ICD-10-CM | POA: Diagnosis not present

## 2015-04-26 DIAGNOSIS — G500 Trigeminal neuralgia: Secondary | ICD-10-CM

## 2015-04-26 DIAGNOSIS — H9201 Otalgia, right ear: Secondary | ICD-10-CM | POA: Diagnosis present

## 2015-04-26 DIAGNOSIS — F17210 Nicotine dependence, cigarettes, uncomplicated: Secondary | ICD-10-CM | POA: Diagnosis not present

## 2015-04-26 MED ORDER — GABAPENTIN 100 MG PO CAPS: 100.0000 mg | ORAL_CAPSULE | Freq: Three times a day (TID) | ORAL | Status: DC | PRN
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
## 2015-04-26 NOTE — ED Notes (Signed)
She states she has had a few flares of pain in right ear this week--very much worse since yesterday.  She is in no distress.  Pain increases with movement of ear pinna.

## 2015-04-26 NOTE — Discharge Instructions (Signed)
Read the information below.  Use the prescribed medication as directed.  Please discuss all new medications with your pharmacist.  You may return to the Emergency Department at any time for worsening condition or any new symptoms that concern you.   If you develop fevers, uncontrolled ear pain, bleeding or discharge from your ear, see your doctor or return for a recheck.      Chronic Pain Chronic pain can be defined as pain that is off and on and lasts for 3-6 months or longer. Many things cause chronic pain, which can make it difficult to make a diagnosis. There are many treatment options available for chronic pain. However, finding a treatment that works well for you may require trying various approaches until the right one is found. Many people benefit from a combination of two or more types of treatment to control their pain. SYMPTOMS  Chronic pain can occur anywhere in the body and can range from mild to very severe. Some types of chronic pain include:  Headache.  Low back pain.  Cancer pain.  Arthritis pain.  Neurogenic pain. This is pain resulting from damage to nerves. People with chronic pain may also have other symptoms such as:  Depression.  Anger.  Insomnia.  Anxiety. DIAGNOSIS  Your health care provider will help diagnose your condition over time. In many cases, the initial focus will be on excluding possible conditions that could be causing the pain. Depending on your symptoms, your health care provider may order tests to diagnose your condition. Some of these tests may include:   Blood tests.   CT scan.   MRI.   X-rays.   Ultrasounds.   Nerve conduction studies.  You may need to see a specialist.  TREATMENT  Finding treatment that works well may take time. You may be referred to a pain specialist. He or she may prescribe medicine or therapies, such as:   Mindful meditation or yoga.  Shots (injections) of numbing or pain-relieving medicines into the  spine or area of pain.  Local electrical stimulation.  Acupuncture.   Massage therapy.   Aroma, color, light, or sound therapy.   Biofeedback.   Working with a physical therapist to keep from getting stiff.   Regular, gentle exercise.   Cognitive or behavioral therapy.   Group support.  Sometimes, surgery may be recommended.  HOME CARE INSTRUCTIONS   Take all medicines as directed by your health care provider.   Lessen stress in your life by relaxing and doing things such as listening to calming music.   Exercise or be active as directed by your health care provider.   Eat a healthy diet and include things such as vegetables, fruits, fish, and lean meats in your diet.   Keep all follow-up appointments with your health care provider.   Attend a support group with others suffering from chronic pain. SEEK MEDICAL CARE IF:   Your pain gets worse.   You develop a new pain that was not there before.   You cannot tolerate medicines given to you by your health care provider.   You have new symptoms since your last visit with your health care provider.  SEEK IMMEDIATE MEDICAL CARE IF:   You feel weak.   You have decreased sensation or numbness.   You lose control of bowel or bladder function.   Your pain suddenly gets much worse.   You develop shaking.  You develop chills.  You develop confusion.  You develop chest pain.  You develop shortness of breath.  MAKE SURE YOU:  Understand these instructions.  Will watch your condition.  Will get help right away if you are not doing well or get worse.   This information is not intended to replace advice given to you by your health care provider. Make sure you discuss any questions you have with your health care provider.   Document Released: 12/19/2001 Document Revised: 11/29/2012 Document Reviewed: 09/22/2012 Elsevier Interactive Patient Education 2016 Elsevier Inc.  Trigeminal  Neuralgia Trigeminal neuralgia is a nerve disorder that causes attacks of severe facial pain. The attacks last from a few seconds to several minutes. They can happen for days, weeks, or months and then go away for months or years. Trigeminal neuralgia is also called tic douloureux. CAUSES This condition is caused by damage to a nerve in the face that is called the trigeminal nerve. An attack can be triggered by:  Talking.  Chewing.  Putting on makeup.  Washing your face.  Shaving your face.  Brushing your teeth.  Touching your face. RISK FACTORS This condition is more likely to develop in:  Women.  People who are 41 years of age or older. SYMPTOMS The main symptom of this condition is pain in the jaw, lips, eyes, nose, scalp, forehead, and face. The pain may be intense, stabbing, electric, or shock-like. DIAGNOSIS This condition is diagnosed with a physical exam. A CT scan or MRI may be done to rule out other conditions that can cause facial pain. TREATMENT This condition may be treated with:  Avoiding the things that trigger your attacks.  Pain medicine.  Surgery. This may be done in severe cases if other medical treatment does not provide relief. HOME CARE INSTRUCTIONS  Take over-the-counter and prescription medicines only as told by your health care provider.  If you wish to get pregnant, talk with your health care provider before you start trying to get pregnant.  Avoid the things that trigger your attacks. It may help to:  Chew on the unaffected side of your mouth.  Avoid touching your face.  Avoid blasts of hot or cold air. SEEK MEDICAL CARE IF:  Your pain medicine is not helping.  You develop new, unexplained symptoms, such as:  Double vision.  Facial weakness.  Changes in hearing or balance.  You become pregnant. SEEK IMMEDIATE MEDICAL CARE IF:  Your pain is unbearable, and your pain medicine does not help.   This information is not intended  to replace advice given to you by your health care provider. Make sure you discuss any questions you have with your health care provider.   Document Released: 03/26/2000 Document Revised: 12/18/2014 Document Reviewed: 07/22/2014 Elsevier Interactive Patient Education [employer].

## 2015-04-30 ENCOUNTER — Ambulatory Visit (INDEPENDENT_AMBULATORY_CARE_PROVIDER_SITE_OTHER): Payer: Medicaid Other | Admitting: Diagnostic Neuroimaging

## 2015-04-30 ENCOUNTER — Encounter: Payer: Self-pay | Admitting: Diagnostic Neuroimaging

## 2015-04-30 VITALS — BP 123/84 | HR 78 | Ht 66.0 in | Wt 227.2 lb

## 2015-04-30 DIAGNOSIS — G500 Trigeminal neuralgia: Secondary | ICD-10-CM | POA: Diagnosis not present

## 2015-04-30 MED ORDER — CARBAMAZEPINE 200 MG PO TABS: 200.0000 mg | ORAL_TABLET | Freq: Two times a day (BID) | ORAL | Status: DC

## 2015-04-30 NOTE — Progress Notes (Signed)
[facility]  PATIENT: Carrie Douglas DOB: 09-28-1975  REFERRING CLINICIAN: ER  HISTORY FROM: patient  REASON FOR VISIT: new consult    HISTORICAL  CHIEF COMPLAINT:  Chief Complaint  Patient presents with  . R ear pain    rm 7, New pt, son Brooke Bonito, "R ear pain, intermittent x 1 month"    HISTORY OF PRESENT ILLNESS:   40 year old female here for evaluation of right ear pain. At age 68 years old patient had onset of intermittent pain around her ear, sometimes stabbing and shocking, sometimes associated with right parietal pain, right eyelid drooping, photophobia and phonophobia. Symptoms can last seconds or up to 2 minutes at a time. In 2013 she was evaluated by neurology and treated with carbamazepine which improved symptoms. In 2014 she stopped carbamazepine due to resolution of symptoms. In 2017 symptoms have returned.  Patient having at least 2 attacks per week. No specific triggering or aggravating factors.   REVIEW OF SYSTEMS: Full 14 system review of systems performed and notable only for numbness dizziness insomnia anemia joint pain joint swelling aching muscles swelling in legs.  ALLERGIES: Allergies  Allergen Reactions  . Codeine Nausea And Vomiting    Tylenol w/codeine makes her very sick.  . Other Itching    Burning Antibiotic for urinary tract infection:  Thinks cipro    HOME MEDICATIONS: Outpatient Prescriptions Prior to Visit  Medication Sig Dispense Refill  . acetaminophen (TYLENOL) 500 MG tablet Take 1,000 mg by mouth every 6 (six) hours as needed for moderate pain.    . calcium-vitamin D (OSCAL WITH D) 500-200 MG-UNIT per tablet Take 1 tablet by mouth daily with breakfast.    . diclofenac sodium (VOLTAREN) 1 % GEL Apply 2 g topically 4 (four) times daily.    . ferrous sulfate 325 (65 FE) MG tablet Take 1 tablet (325 mg total) by mouth 2 (two) times daily with a meal. 60 tablet 3  . gabapentin (NEURONTIN) 100 MG capsule Take 1 capsule (100 mg  total) by mouth 3 (three) times daily as needed (pain). 30 capsule 0  . HYDROcodone-acetaminophen (NORCO/VICODIN) 5-325 MG tablet Take 2 tablets by mouth every 4 (four) hours as needed. 15 tablet 0  . ibuprofen (ADVIL,MOTRIN) 200 MG tablet Take 600 mg by mouth every 6 (six) hours as needed for moderate pain.     . Multiple Vitamins-Minerals (MULTIVITAMIN ADULT PO) Take 1 tablet by mouth daily.    . meclizine (ANTIVERT) 50 MG tablet Take 1 tablet (50 mg total) by mouth 3 (three) times daily as needed. (Patient not taking: Reported on 04/30/2015) 30 tablet 0  . ondansetron (ZOFRAN ODT) 8 MG disintegrating tablet Take 1 tablet (8 mg total) by mouth every 8 (eight) hours as needed for nausea or vomiting. (Patient not taking: Reported on 04/30/2015) 10 tablet 0  . ondansetron (ZOFRAN) 4 MG tablet Take 1 tablet (4 mg total) by mouth every 6 (six) hours. (Patient not taking: Reported on 04/30/2015) 20 tablet 0   No facility-administered medications prior to visit.    PAST MEDICAL HISTORY: Past Medical History  Diagnosis Date  . Rubella     5 months old  . Varicella     54 1/40 years old  . Anxiety   . Depression   . Vaginal delivery 1997, 1999, 2002, 2006, 2008, 2012  . Anemia     blood transfusion 09/14/10  . Iron deficiency anemia   . History of blood transfusion "lots"    "related  to my periods & unable to digest iron since gastric bypass"  . Arthritis     "knees" (07/02/2014)  . Sleep apnea     PAST SURGICAL HISTORY: Past Surgical History  Procedure Laterality Date  . Roux-en-y gastric bypass  2004  . Dilation and evacuation N/A 07/13/2013    Procedure: DILATATION AND EVACUATION;  Surgeon: Woodroe Mode, MD;  Location: [facility];  Service: Gynecology;  Laterality: N/A;  . Laparoscopic bilateral salpingectomy Bilateral 10/10/2013    Procedure: LAPAROSCOPIC BILATERAL SALPINGECTOMY;  Surgeon: Osborne Oman, MD;  Location: [facility];  Service: Gynecology;  Laterality: Bilateral;  with single site  port  . Cholecystectomy open  2007  . Salpingoophorectomy Bilateral 08/2013  . Dilation and curettage of uterus  07/2013    "not a D&E"  . Laparoscopic assisted vaginal hysterectomy N/A 09/17/2014    Procedure: DIAGNOSTIC LAPAROSCOPIC, VAGINAL HYSTERECTOMY;  Surgeon: Osborne Oman, MD;  Location: [facility];  Service: Gynecology;  Laterality: N/A;  . Abdominal hysterectomy      FAMILY HISTORY: Family History  Problem Relation Age of Onset  . Lung cancer Father   . Hypertension Father   . Heart disease Maternal Grandmother   . Heart disease Maternal Grandfather   . Heart disease Paternal Grandmother   . Heart disease Paternal Grandfather     SOCIAL HISTORY:  Social History   Social History  . Marital Status: Married    Spouse Name: Aristeo  . Number of Children: 5  . Years of Education: 14   Occupational History  .      self employed Optometrist   Social History Main Topics  . Smoking status: Current Every Day Smoker -- 0.25 packs/day for 5 years    Types: Cigarettes  . Smokeless tobacco: Never Used     Comment: 04/30/15 10 cigs daily  . Alcohol Use: No  . Drug Use: No  . Sexual Activity: Not Currently    Birth Control/ Protection: Condom, Surgical     Comment: IUD removed in 2010, period method   Other Topics Concern  . Not on file   Social History Narrative   Husband and 5 children at home   Caffeine use- coffee 2 cups daily, diet pepsi     PHYSICAL EXAM  GENERAL EXAM/CONSTITUTIONAL: Vitals:  Filed Vitals:   04/30/15 1034  BP: 123/84  Pulse: 78  Height: 5\' 6"  (1.676 m)  Weight: 227 lb 3.2 oz (103.057 kg)     Body mass index is 36.69 kg/(m^2).  No exam data present  Patient is in no distress; well developed, nourished and groomed; neck is supple  CARDIOVASCULAR:  Examination of carotid arteries is normal; no carotid bruits  Regular rate and rhythm, no murmurs  Examination of peripheral vascular system by observation and palpation is  normal  EYES:  Ophthalmoscopic exam of optic discs and posterior segments is normal; no papilledema or hemorrhages  MUSCULOSKELETAL:  Gait, strength, tone, movements noted in Neurologic exam below  NEUROLOGIC: MENTAL STATUS:  No flowsheet data found.  awake, alert, oriented to person, place and time  recent and remote memory intact  normal attention and concentration  language fluent, comprehension intact, naming intact,   fund of knowledge appropriate  CRANIAL NERVE:   2nd - no papilledema on fundoscopic exam  2nd, 3rd, 4th, 6th - pupils equal and reactive to light, visual fields full to confrontation, extraocular muscles intact, no nystagmus  5th - facial sensation symmetric  7th - facial strength symmetric  8th - hearing intact  9th - palate elevates symmetrically, uvula midline  11th - shoulder shrug symmetric  12th - tongue protrusion midline  MOTOR:   normal bulk and tone, full strength in the BUE, BLE  SENSORY:   normal and symmetric to light touch, pinprick, temperature, vibration    COORDINATION:   finger-nose-finger, fine finger movements normal  REFLEXES:   deep tendon reflexes present and symmetric  GAIT/STATION:   narrow based gait; SLIGHTLY ANTALGIC; LEFT ANKLE SWELLING    DIAGNOSTIC DATA (LABS, IMAGING, TESTING) - I reviewed patient records, labs, notes, testing and imaging myself where available.  Lab Results  Component Value Date   WBC 5.4 04/08/2015   HGB 8.3* 04/08/2015   HCT 28.9* 04/08/2015   MCV 67.4* 04/08/2015   PLT 345 04/08/2015      Component Value Date/Time   NA 141 04/08/2015 1636   K 3.3* 04/08/2015 1636   CL 106 04/08/2015 1636   CO2 26 04/08/2015 1636   GLUCOSE 126* 04/08/2015 1636   BUN 6 04/08/2015 1636   CREATININE 0.82 04/08/2015 1636   CALCIUM 9.2 04/08/2015 1636   PROT 7.0 04/08/2015 1636   ALBUMIN 3.6 04/08/2015 1636   AST 19 04/08/2015 1636   ALT 24 04/08/2015 1636   ALKPHOS 86 04/08/2015  1636   BILITOT 0.3 04/08/2015 1636   GFRNONAA >60 04/08/2015 1636   GFRAA >60 04/08/2015 1636   No results found for: CHOL, HDL, LDLCALC, LDLDIRECT, TRIG, CHOLHDL No results found for: HGBA1C Lab Results  Component Value Date   VITAMINB12 231 07/02/2014   Lab Results  Component Value Date   TSH 1.224 08/07/2014    08/18/11 MRI brain - normal  08/18/11 MRA head - normal    ASSESSMENT AND PLAN  40 y.o. year old female here with intermittent right ear and head/scalp pain shock/stab symptoms since age 29 years old. Previously responded well to carbamazepine.   Ddx: atypical trigeminal neuralgia vs glossopharyngeal neuralgia vs SUNCT vs other trigeminal autonomic cephalgia   PLAN: - restart carbamazepine  Meds ordered this encounter  Medications  . carbamazepine (TEGRETOL) 200 MG tablet    Sig: Take 1-2 tablets (200-400 mg total) by mouth 2 (two) times daily.    Dispense:  120 tablet    Refill:  6   Return in about 3 months (around 07/29/2015).    Penni Bombard, MD [id number], [id number] Certified in Neurology, Neurophysiology and Neuroimaging  [facility] [address], [place], [place] [address] [phone]

## 2015-04-30 NOTE — Patient Instructions (Signed)
Thank you for coming to see [place] at [facility]. I hope we have been able to provide you high quality care today.  You may receive a patient satisfaction survey over the next few weeks. We would appreciate your feedback and comments so that we may continue to improve ourselves and the health of our patients.  - start carbamazepine 26m twice a day; after 1-2 weeks, may increase to 4044mtwice a day if needed   ~~~~~~~~~~~~~~~~~~~~~~~~~~~~~~~~~~~~~~~~~~~~~~~~~~~~~~~~~~~~~~~~~  DR. PENUMALLI'S GUIDE TO HAPPY AND HEALTHY LIVING These are some of my general health and wellness recommendations. Some of them may apply to you better than others. Please use common sense as you try these suggestions and feel free to ask me any questions.   ACTIVITY/FITNESS Mental, social, emotional and physical stimulation are very important for brain and body health. Try learning a new activity (arts, music, language, sports, games).  Keep moving your body to the best of your abilities. You can do this at home, inside or outside, the park, community center, gym or anywhere you like. Consider a physical therapist or personal trainer to get started. Consider the app Sworkit. Fitness trackers such as smart-watches, smart-phones or Fitbits can help as well.   NUTRITION Eat more plants: colorful vegetables, nuts, seeds and berries.  Eat less sugar, salt, preservatives and processed foods.  Avoid toxins such as cigarettes and alcohol.  Drink water when you are thirsty. Warm water with a slice of lemon is an excellent morning drink to start the day.  Consider these websites for more information The Nutrition Source (hthttps://www.henry-hernandez.biz/Precision Nutrition (wwWindowBlog.ch  RELAXATION Consider practicing mindfulness meditation or other relaxation techniques such as deep breathing, prayer, yoga, tai chi, massage. See website mindful.org or  the apps Headspace or Calm to help get started.   SLEEP Try to get at least 7-8+ hours sleep per day. Regular exercise and reduced caffeine will help you sleep better. Practice good sleep hygeine techniques. See website sleep.org for more information.   PLANNING Prepare estate planning, living will, healthcare POA documents. Sometimes this is best planned with the help of an attorney. Theconversationproject.org and agingwithdignity.org are excellent resources.

## 2015-05-02 DIAGNOSIS — G500 Trigeminal neuralgia: Secondary | ICD-10-CM | POA: Insufficient documentation

## 2015-05-14 ENCOUNTER — Ambulatory Visit
Admission: RE | Admit: 2015-05-14 | Discharge: 2015-05-14 | Disposition: A | Discharge status: Home / Self Care | Payer: Medicaid Other | Source: Ambulatory Visit | Attending: Obstetrics & Gynecology | Admitting: Obstetrics & Gynecology

## 2015-05-14 DIAGNOSIS — Z1231 Encounter for screening mammogram for malignant neoplasm of breast: Secondary | ICD-10-CM

## 2015-05-15 ENCOUNTER — Telehealth: Payer: Self-pay | Admitting: General Practice

## 2015-05-15 DIAGNOSIS — B379 Candidiasis, unspecified: Secondary | ICD-10-CM

## 2015-05-15 MED ORDER — FLUCONAZOLE 150 MG PO TABS: 150.0000 mg | ORAL_TABLET | Freq: Once | ORAL | Status: DC

## 2015-05-15 NOTE — Telephone Encounter (Signed)
Patient called and left message stating she keeps getting recurrent yeast infections and is getting quite sore down there. Patient requests medication called in. Called patient and asked if she was having itching and discharge. Patient states yes. Discussed ways to reduce incidence of yeast infections such as avoiding unnecessary antibiotics, decreasing sugary foods and eating healthy, taking a probiotic supplement, no douching or scented products and to use dove unscented soap to clean. Patient verbalized understanding to all & had no questions

## 2015-06-11 ENCOUNTER — Encounter: Payer: Self-pay | Admitting: *Deleted

## 2015-07-29 ENCOUNTER — Encounter: Payer: Self-pay | Admitting: Diagnostic Neuroimaging

## 2015-07-29 ENCOUNTER — Ambulatory Visit (INDEPENDENT_AMBULATORY_CARE_PROVIDER_SITE_OTHER): Payer: Medicaid Other | Admitting: Diagnostic Neuroimaging

## 2015-07-29 VITALS — BP 108/83 | HR 79 | Ht 66.0 in | Wt 230.4 lb

## 2015-07-29 DIAGNOSIS — G500 Trigeminal neuralgia: Secondary | ICD-10-CM

## 2015-07-29 MED ORDER — CARBAMAZEPINE 200 MG PO TABS: 200.0000 mg | ORAL_TABLET | Freq: Two times a day (BID) | ORAL | Status: DC

## 2015-07-29 NOTE — Progress Notes (Signed)
[facility]  PATIENT: Carrie Douglas DOB: 28-Jan-1976  REFERRING CLINICIAN: ER  HISTORY FROM: patient  REASON FOR VISIT: follow up   HISTORICAL  CHIEF COMPLAINT:  Chief Complaint  Patient presents with  . Trigeminal neuralgia, right side    rm 7, son -JR, "medicine very helpful, less episodes of pain, much less severe"  . Follow-up    3 month    HISTORY OF PRESENT ILLNESS:   UPDATE 07/29/15: Since last visit right face pain improved. CBZ doing well. Only 2-3 spasms of pain in last 3 months.   PRIOR HPI (04/30/15): 40 year old female here for evaluation of right ear pain. At age 48 years old patient had onset of intermittent pain around her ear, sometimes stabbing and shocking, sometimes associated with right parietal pain, right eyelid drooping, photophobia and phonophobia. Symptoms can last seconds or up to 2 minutes at a time. In 2013 she was evaluated by neurology and treated with carbamazepine which improved symptoms. In 2014 she stopped carbamazepine due to resolution of symptoms. In 2017 symptoms have returned. Patient having at least 2 attacks per week. No specific triggering or aggravating factors.   REVIEW OF SYSTEMS: Full 14 system review of systems performed and negative except for: joint pain aching muscles restless legs.   ALLERGIES: Allergies  Allergen Reactions  . Codeine Nausea And Vomiting    Tylenol w/codeine makes her very sick.  . Other Itching    Burning Antibiotic for urinary tract infection:  Thinks cipro    HOME MEDICATIONS: Outpatient Prescriptions Prior to Visit  Medication Sig Dispense Refill  . acetaminophen (TYLENOL) 500 MG tablet Take 1,000 mg by mouth every 6 (six) hours as needed for moderate pain.    . calcium-vitamin D (OSCAL WITH D) 500-200 MG-UNIT per tablet Take 1 tablet by mouth daily with breakfast.    . diclofenac sodium (VOLTAREN) 1 % GEL Apply 2 g topically 4 (four) times daily.    . ferrous sulfate 325 (65 FE)  MG tablet Take 1 tablet (325 mg total) by mouth 2 (two) times daily with a meal. 60 tablet 3  . fluconazole (DIFLUCAN) 150 MG tablet Take 1 tablet (150 mg total) by mouth once. 1 tablet 0  . gabapentin (NEURONTIN) 100 MG capsule Take 1 capsule (100 mg total) by mouth 3 (three) times daily as needed (pain). 30 capsule 0  . HYDROcodone-acetaminophen (NORCO/VICODIN) 5-325 MG tablet Take 2 tablets by mouth every 4 (four) hours as needed. 15 tablet 0  . ibuprofen (ADVIL,MOTRIN) 200 MG tablet Take 600 mg by mouth every 6 (six) hours as needed for moderate pain.     . meclizine (ANTIVERT) 50 MG tablet Take 1 tablet (50 mg total) by mouth 3 (three) times daily as needed. 30 tablet 0  . Multiple Vitamins-Minerals (MULTIVITAMIN ADULT PO) Take 1 tablet by mouth daily.    . ondansetron (ZOFRAN ODT) 8 MG disintegrating tablet Take 1 tablet (8 mg total) by mouth every 8 (eight) hours as needed for nausea or vomiting. 10 tablet 0  . ondansetron (ZOFRAN) 4 MG tablet Take 1 tablet (4 mg total) by mouth every 6 (six) hours. 20 tablet 0  . carbamazepine (TEGRETOL) 200 MG tablet Take 1-2 tablets (200-400 mg total) by mouth 2 (two) times daily. 120 tablet 6   No facility-administered medications prior to visit.    PAST MEDICAL HISTORY: Past Medical History  Diagnosis Date  . Rubella     5 months old  . Varicella  40 1/41 years old  . Anxiety   . Depression   . Vaginal delivery 1997, 1999, 2002, 2006, 2008, 2012  . Anemia     blood transfusion 09/14/10  . Iron deficiency anemia   . 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 Arthritis     "knees" (07/02/2014)  . Sleep apnea     PAST SURGICAL HISTORY: Past Surgical History  Procedure Laterality Date  . Roux-en-y gastric bypass  2004  . Dilation and evacuation N/A 07/13/2013    Procedure: DILATATION AND EVACUATION;  Surgeon: Woodroe Mode, MD;  Location: [facility];  Service: Gynecology;   Laterality: N/A;  . Laparoscopic bilateral salpingectomy Bilateral 10/10/2013    Procedure: LAPAROSCOPIC BILATERAL SALPINGECTOMY;  Surgeon: Osborne Oman, MD;  Location: [facility];  Service: Gynecology;  Laterality: Bilateral;  with single site port  . Cholecystectomy open  2007  . Salpingoophorectomy Bilateral 08/2013  . Dilation and curettage of uterus  07/2013    "not a D&E"  . Laparoscopic assisted vaginal hysterectomy N/A 09/17/2014    Procedure: DIAGNOSTIC LAPAROSCOPIC, VAGINAL HYSTERECTOMY;  Surgeon: Osborne Oman, MD;  Location: [facility];  Service: Gynecology;  Laterality: N/A;  . Abdominal hysterectomy      FAMILY HISTORY: Family History  Problem Relation Age of Onset  . Lung cancer Father   . Hypertension Father   . Heart disease Maternal Grandmother   . Heart disease Maternal Grandfather   . Heart disease Paternal Grandmother   . Heart disease Paternal Grandfather     SOCIAL HISTORY:  Social History   Social History  . Marital Status: Married    Spouse Name: Aristeo  . Number of Children: 5  . Years of Education: 14   Occupational History  .      self employed Optometrist   Social History Main Topics  . Smoking status: Current Every Day Smoker -- 0.25 packs/day for 5 years    Types: Cigarettes  . Smokeless tobacco: Never Used     Comment: 04/30/15 10 cigs daily  . Alcohol Use: No  . Drug Use: No  . Sexual Activity: Not Currently    Birth Control/ Protection: Condom, Surgical     Comment: IUD removed in 2010, period method   Other Topics Concern  . Not on file   Social History Narrative   Husband and 5 children at home   Caffeine use- coffee 2 cups daily, diet pepsi     PHYSICAL EXAM  GENERAL EXAM/CONSTITUTIONAL: Vitals:  Filed Vitals:   07/29/15 1301  BP: 108/83  Pulse: 79  Height: 5\' 6"  (1.676 m)  Weight: 230 lb 6.4 oz (104.509 kg)   Body mass index is 37.21 kg/(m^2). No exam data present  Patient is in no distress; well developed, nourished  and groomed; neck is supple  CARDIOVASCULAR:  Examination of carotid arteries is normal; no carotid bruits  Regular rate and rhythm, no murmurs  Examination of peripheral vascular system by observation and palpation is normal  EYES:  Ophthalmoscopic exam of optic discs and posterior segments is normal; no papilledema or hemorrhages  MUSCULOSKELETAL:  Gait, strength, tone, movements noted in Neurologic exam below  NEUROLOGIC: MENTAL STATUS:  No flowsheet data found.  awake, alert, oriented to person, place and time  recent and remote memory intact  normal attention and concentration  language fluent, comprehension intact, naming intact,   fund of knowledge appropriate  CRANIAL NERVE:   2nd - no papilledema on fundoscopic exam  2nd, 3rd, 4th, 6th - pupils equal and reactive to light, visual fields full to confrontation, extraocular muscles intact, no nystagmus  5th - facial sensation symmetric  7th - facial strength symmetric  8th - hearing intact  9th - palate elevates symmetrically, uvula midline  11th - shoulder shrug symmetric  12th - tongue protrusion midline  MOTOR:   normal bulk and tone, full strength in the BUE, BLE  SENSORY:   normal and symmetric to light touch, pinprick, temperature, vibration    COORDINATION:   finger-nose-finger, fine finger movements normal  REFLEXES:   deep tendon reflexes present and symmetric  GAIT/STATION:   narrow based gait; SLIGHTLY ANTALGIC    DIAGNOSTIC DATA (LABS, IMAGING, TESTING) - I reviewed patient records, labs, notes, testing and imaging myself where available.  Lab Results  Component Value Date   WBC 5.4 04/08/2015   HGB 8.3* 04/08/2015   HCT 28.9* 04/08/2015   MCV 67.4* 04/08/2015   PLT 345 04/08/2015      Component Value Date/Time   NA 141 04/08/2015 1636   K 3.3* 04/08/2015 1636   CL 106 04/08/2015 1636   CO2 26 04/08/2015 1636   GLUCOSE 126* 04/08/2015 1636   BUN 6 04/08/2015  1636   CREATININE 0.82 04/08/2015 1636   CALCIUM 9.2 04/08/2015 1636   PROT 7.0 04/08/2015 1636   ALBUMIN 3.6 04/08/2015 1636   AST 19 04/08/2015 1636   ALT 24 04/08/2015 1636   ALKPHOS 86 04/08/2015 1636   BILITOT 0.3 04/08/2015 1636   GFRNONAA >60 04/08/2015 1636   GFRAA >60 04/08/2015 1636   No results found for: CHOL, HDL, LDLCALC, LDLDIRECT, TRIG, CHOLHDL No results found for: HGBA1C Lab Results  Component Value Date   VITAMINB12 231 07/02/2014   Lab Results  Component Value Date   TSH 1.224 08/07/2014    08/18/11 MRI brain - normal  08/18/11 MRA head - normal    ASSESSMENT AND PLAN  40 y.o. year old female here with intermittent right ear and head/scalp pain shock/stab symptoms since age 65 years old. Doing well on carbamazepine.   Dx: atypical trigeminal neuralgia   PLAN: - continue carbamazepine  Orders Placed This Encounter  Procedures  . CBC with Differential/Platelet  . Comprehensive metabolic panel   Meds ordered this encounter  Medications  . carbamazepine (TEGRETOL) 200 MG tablet    Sig: Take 1-2 tablets (200-400 mg total) by mouth 2 (two) times daily.    Dispense:  360 tablet    Refill:  4   Return in about 6 months (around 01/28/2016).    Penni Bombard, MD [id number], [id number] PM Certified in Neurology, Neurophysiology and Neuroimaging  [facility] [address], [place], [place] [address] [phone]

## 2015-07-29 NOTE — Patient Instructions (Signed)
-   continue carbamazepine 

## 2015-07-30 ENCOUNTER — Telehealth: Payer: Self-pay | Admitting: *Deleted

## 2015-07-30 LAB — CBC WITH DIFFERENTIAL/PLATELET
BASOS: 0 %
Basophils Absolute: 0 10*3/uL (ref 0.0–0.2)
EOS (ABSOLUTE): 0.1 10*3/uL (ref 0.0–0.4)
EOS: 1 %
HEMATOCRIT: 28.2 % — AB (ref 34.0–46.6)
Hemoglobin: 8.2 g/dL — ABNORMAL LOW (ref 11.1–15.9)
IMMATURE GRANS (ABS): 0 10*3/uL (ref 0.0–0.1)
Immature Granulocytes: 0 %
Lymphocytes Absolute: 2.2 10*3/uL (ref 0.7–3.1)
Lymphs: 38 %
MCH: 18.9 pg — AB (ref 26.6–33.0)
MCHC: 29.1 g/dL — ABNORMAL LOW (ref 31.5–35.7)
MCV: 65 fL — AB (ref 79–97)
MONOS ABS: 0.4 10*3/uL (ref 0.1–0.9)
Monocytes: 8 %
NEUTROS ABS: 3 10*3/uL (ref 1.4–7.0)
NEUTROS PCT: 53 %
PLATELETS: 285 10*3/uL (ref 150–379)
RBC: 4.35 x10E6/uL (ref 3.77–5.28)
RDW: 17.9 % — AB (ref 12.3–15.4)
WBC: 5.7 10*3/uL (ref 3.4–10.8)

## 2015-07-30 LAB — COMPREHENSIVE METABOLIC PANEL
A/G RATIO: 1.7 (ref 1.2–2.2)
ALT: 5 IU/L (ref 0–32)
AST: 10 IU/L (ref 0–40)
Albumin: 4.3 g/dL (ref 3.5–5.5)
Alkaline Phosphatase: 68 IU/L (ref 39–117)
BUN/Creatinine Ratio: 17 (ref 9–23)
BUN: 11 mg/dL (ref 6–24)
CALCIUM: 9 mg/dL (ref 8.7–10.2)
CHLORIDE: 105 mmol/L (ref 96–106)
CO2: 22 mmol/L (ref 18–29)
Creatinine, Ser: 0.65 mg/dL (ref 0.57–1.00)
GFR, EST AFRICAN AMERICAN: 128 mL/min/{1.73_m2} (ref >59)
GFR, EST NON AFRICAN AMERICAN: 111 mL/min/{1.73_m2} (ref >59)
GLOBULIN, TOTAL: 2.5 g/dL (ref 1.5–4.5)
Glucose: 87 mg/dL (ref 65–99)
POTASSIUM: 5.1 mmol/L (ref 3.5–5.2)
SODIUM: 141 mmol/L (ref 134–144)
TOTAL PROTEIN: 6.8 g/dL (ref 6.0–8.5)

## 2015-07-30 NOTE — Telephone Encounter (Signed)
Per Dr Leta Baptist, LVM for patient informing her that she needs to follow up with her PCP re: chronic anemia. Informed her the other lab results are normal. Left name, number for any questions.

## 2015-09-10 ENCOUNTER — Emergency Department (HOSPITAL_COMMUNITY): Payer: Medicaid Other

## 2015-09-10 ENCOUNTER — Encounter (HOSPITAL_COMMUNITY): Payer: Self-pay | Admitting: Emergency Medicine

## 2015-09-10 ENCOUNTER — Emergency Department (HOSPITAL_COMMUNITY)
Admission: EM | Admit: 2015-09-10 | Discharge: 2015-09-10 | Disposition: A | Discharge status: Home / Self Care | Payer: Medicaid Other | Attending: Emergency Medicine | Admitting: Emergency Medicine

## 2015-09-10 DIAGNOSIS — F419 Anxiety disorder, unspecified: Secondary | ICD-10-CM | POA: Diagnosis not present

## 2015-09-10 DIAGNOSIS — Z8669 Personal history of other diseases of the nervous system and sense organs: Secondary | ICD-10-CM | POA: Diagnosis not present

## 2015-09-10 DIAGNOSIS — Z79899 Other long term (current) drug therapy: Secondary | ICD-10-CM | POA: Diagnosis not present

## 2015-09-10 DIAGNOSIS — I959 Hypotension, unspecified: Secondary | ICD-10-CM | POA: Diagnosis not present

## 2015-09-10 DIAGNOSIS — F17210 Nicotine dependence, cigarettes, uncomplicated: Secondary | ICD-10-CM | POA: Diagnosis not present

## 2015-09-10 DIAGNOSIS — D649 Anemia, unspecified: Secondary | ICD-10-CM | POA: Insufficient documentation

## 2015-09-10 DIAGNOSIS — Z8619 Personal history of other infectious and parasitic diseases: Secondary | ICD-10-CM | POA: Insufficient documentation

## 2015-09-10 DIAGNOSIS — R42 Dizziness and giddiness: Secondary | ICD-10-CM | POA: Diagnosis present

## 2015-09-10 DIAGNOSIS — Z791 Long term (current) use of non-steroidal anti-inflammatories (NSAID): Secondary | ICD-10-CM | POA: Insufficient documentation

## 2015-09-10 DIAGNOSIS — Z3202 Encounter for pregnancy test, result negative: Secondary | ICD-10-CM | POA: Diagnosis not present

## 2015-09-10 DIAGNOSIS — R001 Bradycardia, unspecified: Secondary | ICD-10-CM | POA: Insufficient documentation

## 2015-09-10 DIAGNOSIS — M170 Bilateral primary osteoarthritis of knee: Secondary | ICD-10-CM | POA: Diagnosis not present

## 2015-09-10 DIAGNOSIS — F329 Major depressive disorder, single episode, unspecified: Secondary | ICD-10-CM | POA: Insufficient documentation

## 2015-09-10 LAB — COMPREHENSIVE METABOLIC PANEL
ALT: 9 U/L — ABNORMAL LOW (ref 14–54)
ANION GAP: 6 (ref 5–15)
AST: 15 U/L (ref 15–41)
Albumin: 3.8 g/dL (ref 3.5–5.0)
Alkaline Phosphatase: 68 U/L (ref 38–126)
BILIRUBIN TOTAL: 0.1 mg/dL — AB (ref 0.3–1.2)
BUN: 10 mg/dL (ref 6–20)
CHLORIDE: 109 mmol/L (ref 101–111)
CO2: 22 mmol/L (ref 22–32)
Calcium: 8.9 mg/dL (ref 8.9–10.3)
Creatinine, Ser: 0.65 mg/dL (ref 0.44–1.00)
GFR calc Af Amer: >60 mL/min (ref >60)
Glucose, Bld: 132 mg/dL — ABNORMAL HIGH (ref 65–99)
POTASSIUM: 4.1 mmol/L (ref 3.5–5.1)
Sodium: 137 mmol/L (ref 135–145)
TOTAL PROTEIN: 6.6 g/dL (ref 6.5–8.1)

## 2015-09-10 LAB — CBC WITH DIFFERENTIAL/PLATELET
BASOS ABS: 0 10*3/uL (ref 0.0–0.1)
Basophils Relative: 0 %
EOS PCT: 1 %
Eosinophils Absolute: 0.1 10*3/uL (ref 0.0–0.7)
HCT: 28.6 % — ABNORMAL LOW (ref 36.0–46.0)
HEMOGLOBIN: 8.1 g/dL — AB (ref 12.0–15.0)
LYMPHS PCT: 32 %
Lymphs Abs: 2 10*3/uL (ref 0.7–4.0)
MCH: 18.8 pg — AB (ref 26.0–34.0)
MCHC: 28.3 g/dL — ABNORMAL LOW (ref 30.0–36.0)
MCV: 66.2 fL — AB (ref 78.0–100.0)
MONOS PCT: 8 %
Monocytes Absolute: 0.5 10*3/uL (ref 0.1–1.0)
NEUTROS PCT: 59 %
Neutro Abs: 3.7 10*3/uL (ref 1.7–7.7)
PLATELETS: 261 10*3/uL (ref 150–400)
RBC: 4.32 MIL/uL (ref 3.87–5.11)
RDW: 17.8 % — ABNORMAL HIGH (ref 11.5–15.5)
WBC: 6.3 10*3/uL (ref 4.0–10.5)

## 2015-09-10 LAB — URINALYSIS, ROUTINE W REFLEX MICROSCOPIC
Bilirubin Urine: NEGATIVE
Glucose, UA: 100 mg/dL — AB
Hgb urine dipstick: NEGATIVE
Ketones, ur: NEGATIVE mg/dL
LEUKOCYTES UA: NEGATIVE
NITRITE: NEGATIVE
PROTEIN: NEGATIVE mg/dL
Specific Gravity, Urine: 1.02 (ref 1.005–1.030)
pH: 5.5 (ref 5.0–8.0)

## 2015-09-10 LAB — CARBAMAZEPINE LEVEL, TOTAL: CARBAMAZEPINE LVL: 2.3 ug/mL — AB (ref 4.0–12.0)

## 2015-09-10 LAB — TYPE AND SCREEN
ABO/RH(D): A POS
Antibody Screen: NEGATIVE

## 2015-09-10 LAB — PREGNANCY, URINE: PREG TEST UR: NEGATIVE

## 2015-09-10 MED ORDER — SODIUM CHLORIDE 0.9 % IV BOLUS (SEPSIS)
1000.0000 mL | Freq: Once | INTRAVENOUS | Status: AC
  Administered 2015-09-10: 1000 mL via INTRAVENOUS

## 2015-09-10 NOTE — ED Notes (Signed)
Pt aware urine sample is needed 

## 2015-09-10 NOTE — ED Provider Notes (Signed)
CSN: [id number]     Arrival date & time 09/10/15  1259 History   First MD Initiated Contact with Patient 09/10/15 1740     Chief Complaint  Patient presents with  . Dizziness   PT SAID THAT SHE'S BEEN DIZZY FOR THE PAST 2 WEEKS.  PT HAS A HX OF ANEMIA WITH A CHRONIC HGB ON 8.  SHE IS WORRIED THAT IT IS LOWER.  PT SAID THAT SHE PASSED OUT TODAY.    (Consider location/radiation/quality/duration/timing/severity/associated sxs/prior Treatment) Patient is a 40 y.o. female presenting with dizziness. The history is provided by the patient.  Dizziness Quality:  Lightheadedness Severity:  Moderate Onset quality:  Sudden Timing:  Intermittent Progression:  Worsening Context: standing up   Relieved by:  Nothing   Past Medical History  Diagnosis Date  . Rubella     5 months old  . Varicella     32 1/41 years old  . Anxiety   . Depression   . Vaginal delivery 1997, 1999, 2002, 2006, 2008, 2012  . Anemia     blood transfusion 09/14/10  . Iron deficiency anemia   . 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 Arthritis     "knees" (07/02/2014)  . Sleep apnea    Past Surgical History  Procedure Laterality Date  . Roux-en-y gastric bypass  2004  . Dilation and evacuation N/A 07/13/2013    Procedure: DILATATION AND EVACUATION;  Surgeon: Woodroe Mode, MD;  Location: [facility];  Service: Gynecology;  Laterality: N/A;  . Laparoscopic bilateral salpingectomy Bilateral 10/10/2013    Procedure: LAPAROSCOPIC BILATERAL SALPINGECTOMY;  Surgeon: Osborne Oman, MD;  Location: [facility];  Service: Gynecology;  Laterality: Bilateral;  with single site port  . Cholecystectomy open  2007  . Salpingoophorectomy Bilateral 08/2013  . Dilation and curettage of uterus  07/2013    "not a D&E"  . Laparoscopic assisted vaginal hysterectomy N/A 09/17/2014    Procedure: DIAGNOSTIC LAPAROSCOPIC, VAGINAL HYSTERECTOMY;  Surgeon: Osborne Oman, MD;   Location: [facility];  Service: Gynecology;  Laterality: N/A;  . Abdominal hysterectomy     Family History  Problem Relation Age of Onset  . Lung cancer Father   . Hypertension Father   . Heart disease Maternal Grandmother   . Heart disease Maternal Grandfather   . Heart disease Paternal Grandmother   . Heart disease Paternal Grandfather    Social History  Substance Use Topics  . Smoking status: Current Every Day Smoker -- 0.25 packs/day for 5 years    Types: Cigarettes  . Smokeless tobacco: Never Used     Comment: 04/30/15 10 cigs daily  . Alcohol Use: No   OB History    Gravida Para Term Preterm AB TAB SAB Ectopic Multiple Living   6 6 6  0 0 0 0 0 0 6     Review of Systems  Neurological: Positive for dizziness.  All other systems reviewed and are negative.     Allergies  Codeine and Other  Home Medications   Prior to Admission medications   Medication Sig Start Date End Date Taking? Authorizing Provider  acetaminophen (TYLENOL) 500 MG tablet Take 1,000 mg by mouth every 6 (six) hours as needed for moderate pain.   Yes Historical Provider, MD  calcium-vitamin D (OSCAL WITH D) 500-200 MG-UNIT per tablet Take 1 tablet by mouth daily with breakfast.   Yes Historical Provider, MD  carbamazepine (  TEGRETOL) 200 MG tablet Take 1-2 tablets (200-400 mg total) by mouth 2 (two) times daily. 07/29/15  Yes Penni Bombard, MD  diclofenac sodium (VOLTAREN) 1 % GEL Apply 2 g topically 4 (four) times daily.   Yes Historical Provider, MD  ferrous sulfate 325 (65 FE) MG tablet Take 1 tablet (325 mg total) by mouth 2 (two) times daily with a meal. 07/03/14  Yes Domenic Polite, MD  ibuprofen (ADVIL,MOTRIN) 200 MG tablet Take 600 mg by mouth every 6 (six) hours as needed for moderate pain.    Yes Historical Provider, MD  Multiple Vitamins-Minerals (MULTIVITAMIN ADULT PO) Take 1 tablet by mouth daily.   Yes Historical Provider, MD  fluconazole (DIFLUCAN) 150 MG tablet Take 1 tablet (150 mg  total) by mouth once. 05/15/15   Osborne Oman, MD  gabapentin (NEURONTIN) 100 MG capsule Take 1 capsule (100 mg total) by mouth 3 (three) times daily as needed (pain). 04/26/15   Clayton Bibles, PA-C  HYDROcodone-acetaminophen (NORCO/VICODIN) 5-325 MG tablet Take 2 tablets by mouth every 4 (four) hours as needed. 04/08/15   Esaw Grandchild, MD  meclizine (ANTIVERT) 50 MG tablet Take 1 tablet (50 mg total) by mouth 3 (three) times daily as needed. 04/03/15   Tatyana Kirichenko, PA-C  ondansetron (ZOFRAN ODT) 8 MG disintegrating tablet Take 1 tablet (8 mg total) by mouth every 8 (eight) hours as needed for nausea or vomiting. 04/03/15   Tatyana Kirichenko, PA-C  ondansetron (ZOFRAN) 4 MG tablet Take 1 tablet (4 mg total) by mouth every 6 (six) hours. 04/08/15   Esaw Grandchild, MD   BP 93/57 mmHg  Pulse 68  Temp(Src) 98.1 F (36.7 C) (Oral)  Resp 17  SpO2 92%  LMP 08/22/2014 (Exact Date) Physical Exam  Constitutional: She is oriented to person, place, and time. She appears well-developed and well-nourished.  HENT:  Head: Normocephalic and atraumatic.  Right Ear: External ear normal.  Left Ear: External ear normal.  Nose: Nose normal.  Mouth/Throat: Oropharynx is clear and moist.  Eyes: Conjunctivae and EOM are normal. Pupils are equal, round, and reactive to light.  Neck: Normal range of motion. Neck supple.  Cardiovascular: Regular rhythm, normal heart sounds and intact distal pulses.  Bradycardia present.   Pulmonary/Chest: Effort normal and breath sounds normal.  Abdominal: Soft. Bowel sounds are normal.  Musculoskeletal: Normal range of motion.  Neurological: She is alert and oriented to person, place, and time.  Skin: Skin is warm and dry.  Psychiatric: She has a normal mood and affect. Her behavior is normal. Judgment and thought content normal.  Nursing note and vitals reviewed.   ED Course  Procedures (including critical care time) Labs Review Labs Reviewed  COMPREHENSIVE  METABOLIC PANEL - Abnormal; Notable for the following:    Glucose, Bld 132 (*)    ALT 9 (*)    Total Bilirubin 0.1 (*)    All other components within normal limits  CBC WITH DIFFERENTIAL/PLATELET - Abnormal; Notable for the following:    Hemoglobin 8.1 (*)    HCT 28.6 (*)    MCV 66.2 (*)    MCH 18.8 (*)    MCHC 28.3 (*)    RDW 17.8 (*)    All other components within normal limits  URINALYSIS, ROUTINE W REFLEX MICROSCOPIC (NOT AT [facility]) - Abnormal; Notable for the following:    Glucose, UA 100 (*)    All other components within normal limits  CARBAMAZEPINE LEVEL, TOTAL - Abnormal; Notable for the following:  Carbamazepine Lvl 2.3 (*)    All other components within normal limits  PREGNANCY, URINE  TYPE AND SCREEN    Imaging Review Dg Chest 2 View  09/10/2015  CLINICAL DATA:  Syncope, smoker EXAM: CHEST  2 VIEW COMPARISON:  07/02/2014 FINDINGS: The heart size and mediastinal contours are within normal limits. Both lungs are clear. The visualized skeletal structures are unremarkable. IMPRESSION: No active cardiopulmonary disease. Electronically Signed   By: Skipper Cliche M.D.   On: 09/10/2015 19:27   Ct Head Wo Contrast  09/10/2015  CLINICAL DATA:  Chronic episodes of dizziness and generalized weakness. Initial encounter. EXAM: CT HEAD WITHOUT CONTRAST TECHNIQUE: Contiguous axial images were obtained from the base of the skull through the vertex without intravenous contrast. COMPARISON:  CT of the head performed 09/06/2012 FINDINGS: There is no evidence of acute infarction, mass lesion, or intra- or extra-axial hemorrhage on CT. The posterior fossa, including the cerebellum, brainstem and fourth ventricle, is within normal limits. The third and lateral ventricles, and basal ganglia are unremarkable in appearance. The cerebral hemispheres are symmetric in appearance, with normal gray-white differentiation. No mass effect or midline shift is seen. There is no evidence of fracture; visualized  osseous structures are unremarkable in appearance. The visualized portions of the orbits are within normal limits. The paranasal sinuses and mastoid air cells are well-aerated. No significant soft tissue abnormalities are seen. IMPRESSION: Unremarkable noncontrast CT of the head. Electronically Signed   By: Garald Balding M.D.   On: 09/10/2015 19:51   I have personally reviewed and evaluated these images and lab results as part of my medical decision-making.   EKG Interpretation   Date/Time:  Wednesday Sep 10 2015 17:36:29 EDT Ventricular Rate:  48 PR Interval:  202 QRS Duration: 98 QT Interval:  499 QTC Calculation: 446 R Axis:   40 Text Interpretation:  Sinus bradycardia Confirmed by Donnette Macmullen MD, Randilyn Foisy  ([id number]) on 09/10/2015 6:38:02 PM      MDM  PT HAS CHRONIC ANEMIA AND HGB IS STABLE FOR PT.  PT REQUESTS IV FERAHEME HERE, BUT I THINK PT NEEDS TO F/U WITH A HEMATOLOGIST TO DECIDE WHAT IS RIGHT FOR PT.  PT GIVEN NUMBER TO F/U.  PT ALSO INSTR TO TAKE HER ORAL IRON, DRINK LOTS OF FLUIDS, AND INCREASE SALT INTAKE. Final diagnoses:  Symptomatic anemia  Hypotension, unspecified hypotension type       Isla Pence, MD 09/10/15 2350

## 2015-09-10 NOTE — ED Notes (Signed)
MD aware of othostatic vitals.

## 2015-09-10 NOTE — ED Notes (Signed)
Pt sts dizziness upon standing x 2 weeks and appears pale; pt sts hx of anemia; pt sts generalized weakness

## 2015-09-10 NOTE — ED Notes (Signed)
Unable to tolerate orthostatics at this time due to dizziness, BP after standing 84/55, P-61

## 2015-09-10 NOTE — ED Notes (Signed)
MD at bedside. 

## 2015-09-11 ENCOUNTER — Telehealth: Payer: Self-pay | Admitting: Hematology and Oncology

## 2015-09-11 NOTE — Telephone Encounter (Signed)
Pt called in after a visit to the ED and called to schedule an hem appt.  Referral was created and in basket Dr. Alvy Bimler.

## 2015-09-12 ENCOUNTER — Telehealth: Payer: Self-pay | Admitting: Hematology and Oncology

## 2015-09-12 NOTE — Telephone Encounter (Signed)
Lt mess for new pt appt 6/6 at 10:30.

## 2015-09-16 ENCOUNTER — Ambulatory Visit (HOSPITAL_BASED_OUTPATIENT_CLINIC_OR_DEPARTMENT_OTHER): Payer: Medicaid Other | Admitting: Hematology and Oncology

## 2015-09-16 ENCOUNTER — Telehealth: Payer: Self-pay | Admitting: Hematology and Oncology

## 2015-09-16 ENCOUNTER — Other Ambulatory Visit (HOSPITAL_BASED_OUTPATIENT_CLINIC_OR_DEPARTMENT_OTHER): Payer: Medicaid Other

## 2015-09-16 VITALS — BP 109/52 | HR 69 | Temp 98.2°F | Resp 18 | Ht 66.0 in | Wt 230.2 lb

## 2015-09-16 DIAGNOSIS — E538 Deficiency of other specified B group vitamins: Secondary | ICD-10-CM

## 2015-09-16 DIAGNOSIS — E559 Vitamin D deficiency, unspecified: Secondary | ICD-10-CM

## 2015-09-16 DIAGNOSIS — D509 Iron deficiency anemia, unspecified: Secondary | ICD-10-CM

## 2015-09-16 DIAGNOSIS — Z9884 Bariatric surgery status: Secondary | ICD-10-CM | POA: Diagnosis not present

## 2015-09-16 DIAGNOSIS — Z720 Tobacco use: Secondary | ICD-10-CM | POA: Diagnosis not present

## 2015-09-16 DIAGNOSIS — Z801 Family history of malignant neoplasm of trachea, bronchus and lung: Secondary | ICD-10-CM | POA: Diagnosis not present

## 2015-09-16 LAB — CBC & DIFF AND RETIC
BASO%: 0.1 % (ref 0.0–2.0)
BASOS ABS: 0 10*3/uL (ref 0.0–0.1)
EOS%: 1.3 % (ref 0.0–7.0)
Eosinophils Absolute: 0.1 10*3/uL (ref 0.0–0.5)
HEMATOCRIT: 29.4 % — AB (ref 34.8–46.6)
HGB: 8.4 g/dL — ABNORMAL LOW (ref 11.6–15.9)
IMMATURE RETIC FRACT: 17.4 % — AB (ref 1.60–10.00)
LYMPH%: 34.4 % (ref 14.0–49.7)
MCH: 19 pg — ABNORMAL LOW (ref 25.1–34.0)
MCHC: 28.6 g/dL — ABNORMAL LOW (ref 31.5–36.0)
MCV: 66.5 fL — ABNORMAL LOW (ref 79.5–101.0)
MONO#: 0.5 10*3/uL (ref 0.1–0.9)
MONO%: 6.6 % (ref 0.0–14.0)
NEUT#: 4 10*3/uL (ref 1.5–6.5)
NEUT%: 57.6 % (ref 38.4–76.8)
PLATELETS: 255 10*3/uL (ref 145–400)
RBC: 4.42 10*6/uL (ref 3.70–5.45)
RDW: 17.8 % — ABNORMAL HIGH (ref 11.2–14.5)
RETIC CT ABS: 45.53 10*3/uL (ref 33.70–90.70)
Retic %: 1.03 % (ref 0.70–2.10)
WBC: 7 10*3/uL (ref 3.9–10.3)
lymph#: 2.4 10*3/uL (ref 0.9–3.3)

## 2015-09-16 LAB — FERRITIN: Ferritin: 5 ng/ml — ABNORMAL LOW (ref 9–269)

## 2015-09-16 NOTE — Assessment & Plan Note (Signed)
This is a most likely culprit for her severe anemia and several mineral deficiencies. I will order vitamin D level to exclude vitamin D deficiency If she has persistent iron deficiency despite IV iron replacement, she might need repeat EGD to exclude bleeding at the anastomosis site

## 2015-09-16 NOTE — Telephone Encounter (Signed)
Gave pt apt & avs °

## 2015-09-16 NOTE — Assessment & Plan Note (Signed)
Her previous B-12 level was low and with her history of gastric bypass surgery, she will need B-12 injection otherwise she would not have effective erythropoiesis I recommend vitamin B 12 injection with each iron replacement therapy for effective erythropoiesis

## 2015-09-16 NOTE — Assessment & Plan Note (Signed)
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 We discussed some of the risks, benefits, and alternatives of intravenous iron infusions. The patient is symptomatic from anemia and the iron level is critically low. She tolerated oral iron supplement poorly and desires to achieved higher levels of iron faster for adequate hematopoesis. Some of the side-effects to be expected including risks of infusion reactions, phlebitis, headaches, nausea and fatigue.  The patient is willing to proceed. Patient education material was dispensed.  Goal is to keep ferritin level greater than 50  

## 2015-09-16 NOTE — Progress Notes (Signed)
[facility] CONSULT NOTE  Patient Care Team: Nolene Ebbs, MD as PCP - General (Internal Medicine)  CHIEF COMPLAINTS/PURPOSE OF CONSULTATION:  Severe iron deficiency anemia  HISTORY OF PRESENTING ILLNESS:  Carrie Douglas 40 y.o. female is here because of severe iron deficiency anemia. 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 She had gastric bypass surgery after the birth of her daughter. She has 6 children in total. The range from 57-year-old to 41 year old. 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 She has received numerous blood transfusions over the past 7 years and also intermittent IV iron replacement therapy. She moved from [place] to [place] 5 years ago and has no consistent follow-up with hematologist.  She was found to have abnormal CBC from recent blood work. CBC dated back the past few years range from as low as 6.2 to as high as 10.8 She went to the emergency department recently complaining of dizziness. She complained of chest discomfort on exertion, shortness of breath on minimal exertion, pre-syncopal episodes, and palpitations. She had not noticed any recent bleeding such as epistaxis, hematuria or hematochezia The patient denies regular over the counter NSAID ingestion. She takes ibuprofen once or twice a month only.She is not on antiplatelets agents.  She had no prior history or diagnosis of cancer. Her age appropriate screening programs are up-to-date. She has significant pica with excessive chewing of ice but eats a variety of diet. She never donated blood The patient was prescribed oral iron supplements and she takes consistently 3 times a day with food. The iron pill causes significant constipation.  MEDICAL HISTORY:  Past Medical History  Diagnosis Date  . Rubella     5 months old  . Varicella     27 1/41 years old  . Anxiety   . Depression   . Vaginal delivery  1997, 1999, 2002, 2006, 2008, 2012  . Anemia     blood transfusion 09/14/10  . Iron deficiency anemia   . 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 Arthritis     "knees" (07/02/2014)  . Sleep apnea     SURGICAL HISTORY: Past Surgical History  Procedure Laterality Date  . Roux-en-y gastric bypass  2004  . Dilation and evacuation N/A 07/13/2013    Procedure: DILATATION AND EVACUATION;  Surgeon: Woodroe Mode, MD;  Location: [facility];  Service: Gynecology;  Laterality: N/A;  . Laparoscopic bilateral salpingectomy Bilateral 10/10/2013    Procedure: LAPAROSCOPIC BILATERAL SALPINGECTOMY;  Surgeon: Osborne Oman, MD;  Location: [facility];  Service: Gynecology;  Laterality: Bilateral;  with single site port  . Cholecystectomy open  2007  . Salpingoophorectomy Bilateral 08/2013  . Dilation and curettage of uterus  07/2013    "not a D&E"  . Laparoscopic assisted vaginal hysterectomy N/A 09/17/2014    Procedure: DIAGNOSTIC LAPAROSCOPIC, VAGINAL HYSTERECTOMY;  Surgeon: Osborne Oman, MD;  Location: [facility];  Service: Gynecology;  Laterality: N/A;  . Abdominal hysterectomy      SOCIAL HISTORY: Social History   Social History  . Marital Status: Married    Spouse Name: Aristeo  . Number of Children: 5  . Years of Education: 14   Occupational History  .      self employed Optometrist   Social History Main Topics  . Smoking status: Current Every Day Smoker -- 0.25 packs/day for 5 years  Types: Cigarettes  . Smokeless tobacco: Never Used     Comment: 04/30/15 10 cigs daily  . Alcohol Use: No  . Drug Use: No  . Sexual Activity: Not Currently    Birth Control/ Protection: Condom, Surgical     Comment: IUD removed in 2010, period method   Other Topics Concern  . Not on file   Social History Narrative   Husband and 5 children at home   Caffeine use- coffee 2 cups daily, diet pepsi    FAMILY HISTORY: Family History   Problem Relation Age of Onset  . Lung cancer Father   . Hypertension Father   . Heart disease Maternal Grandmother   . Heart disease Maternal Grandfather   . Heart disease Paternal Grandmother   . Heart disease Paternal Grandfather     ALLERGIES:  is allergic to codeine and other.  MEDICATIONS:  Current Outpatient Prescriptions  Medication Sig Dispense Refill  . acetaminophen (TYLENOL) 500 MG tablet Take 1,000 mg by mouth every 6 (six) hours as needed for moderate pain.    . calcium-vitamin D (OSCAL WITH D) 500-200 MG-UNIT per tablet Take 1 tablet by mouth daily with breakfast.    . carbamazepine (TEGRETOL) 200 MG tablet Take 1-2 tablets (200-400 mg total) by mouth 2 (two) times daily. 360 tablet 4  . diclofenac sodium (VOLTAREN) 1 % GEL Apply 2 g topically 4 (four) times daily.    . ferrous sulfate 325 (65 FE) MG tablet Take 1 tablet (325 mg total) by mouth 2 (two) times daily with a meal. 60 tablet 3  . fluconazole (DIFLUCAN) 150 MG tablet Take 1 tablet (150 mg total) by mouth once. 1 tablet 0  . gabapentin (NEURONTIN) 100 MG capsule Take 1 capsule (100 mg total) by mouth 3 (three) times daily as needed (pain). 30 capsule 0  . HYDROcodone-acetaminophen (NORCO/VICODIN) 5-325 MG tablet Take 2 tablets by mouth every 4 (four) hours as needed. 15 tablet 0  . ibuprofen (ADVIL,MOTRIN) 200 MG tablet Take 600 mg by mouth every 6 (six) hours as needed for moderate pain.     . meclizine (ANTIVERT) 50 MG tablet Take 1 tablet (50 mg total) by mouth 3 (three) times daily as needed. 30 tablet 0  . Multiple Vitamins-Minerals (MULTIVITAMIN ADULT PO) Take 1 tablet by mouth daily.    . ondansetron (ZOFRAN ODT) 8 MG disintegrating tablet Take 1 tablet (8 mg total) by mouth every 8 (eight) hours as needed for nausea or vomiting. 10 tablet 0  . ondansetron (ZOFRAN) 4 MG tablet Take 1 tablet (4 mg total) by mouth every 6 (six) hours. 20 tablet 0   No current facility-administered medications for this visit.     REVIEW OF SYSTEMS:   Constitutional: Denies fevers, chills or abnormal night sweats Eyes: Denies blurriness of vision, double vision or watery eyes Ears, nose, mouth, throat, and face: Denies mucositis or sore throat Skin: Denies abnormal skin rashes Lymphatics: Denies new lymphadenopathy or easy bruising Neurological:Denies numbness, tingling or new weaknesses Behavioral/Psych: Mood is stable, no new changes  All other systems were reviewed with the patient and are negative.  PHYSICAL EXAMINATION: ECOG PERFORMANCE STATUS: 1 - Symptomatic but completely ambulatory  Filed Vitals:   09/16/15 0948  BP: 109/52  Pulse: 69  Temp: 98.2 F (36.8 C)  Resp: 18   Filed Weights   09/16/15 0948  Weight: 230 lb 3.2 oz (104.418 kg)    GENERAL:alert, no distress and comfortable. She is obese SKIN: skin color is  pale, texture, turgor are normal, no rashes or significant lesions EYES: normal, conjunctiva are pale and non-injected, sclera clear OROPHARYNX:no exudate, no erythema and lips, buccal mucosa, and tongue normal  NECK: supple, thyroid normal size, non-tender, without nodularity LYMPH:  no palpable lymphadenopathy in the cervical, axillary or inguinal LUNGS: clear to auscultation and percussion with normal breathing effort HEART: regular rate & rhythm and no murmurs and no lower extremity edema ABDOMEN:abdomen soft, non-tender and normal bowel sounds. Well-healed surgical scar Musculoskeletal:no cyanosis of digits and no clubbing  PSYCH: alert & oriented x 3 with fluent speech NEURO: no focal motor/sensory deficits  RADIOGRAPHIC STUDIES: I have personally reviewed the radiological images as listed and agreed with the findings in the report. Dg Chest 2 View  09/10/2015  CLINICAL DATA:  Syncope, smoker EXAM: CHEST  2 VIEW COMPARISON:  07/02/2014 FINDINGS: The heart size and mediastinal contours are within normal limits. Both lungs are clear. The visualized skeletal structures are  unremarkable. IMPRESSION: No active cardiopulmonary disease. Electronically Signed   By: Skipper Cliche M.D.   On: 09/10/2015 19:27   Ct Head Wo Contrast  09/10/2015  CLINICAL DATA:  Chronic episodes of dizziness and generalized weakness. Initial encounter. EXAM: CT HEAD WITHOUT CONTRAST TECHNIQUE: Contiguous axial images were obtained from the base of the skull through the vertex without intravenous contrast. COMPARISON:  CT of the head performed 09/06/2012 FINDINGS: There is no evidence of acute infarction, mass lesion, or intra- or extra-axial hemorrhage on CT. The posterior fossa, including the cerebellum, brainstem and fourth ventricle, is within normal limits. The third and lateral ventricles, and basal ganglia are unremarkable in appearance. The cerebral hemispheres are symmetric in appearance, with normal gray-white differentiation. No mass effect or midline shift is seen. There is no evidence of fracture; visualized osseous structures are unremarkable in appearance. The visualized portions of the orbits are within normal limits. The paranasal sinuses and mastoid air cells are well-aerated. No significant soft tissue abnormalities are seen. IMPRESSION: Unremarkable noncontrast CT of the head. Electronically Signed   By: Garald Balding M.D.   On: 09/10/2015 19:51    ASSESSMENT & PLAN:  Iron deficiency anemia The most likely cause of her anemia is due to malabsorption syndrome. We discussed some of the risks, benefits, and alternatives of intravenous iron infusions. The patient is symptomatic from anemia and the iron level is critically low. She tolerated oral iron supplement poorly and desires to achieved higher levels of iron faster for adequate hematopoesis. Some of the side-effects to be expected including risks of infusion reactions, phlebitis, headaches, nausea and fatigue.  The patient is willing to proceed. Patient education material was dispensed.  Goal is to keep ferritin level greater  than 50   Vitamin B12 deficiency Her previous B-12 level was low and with her history of gastric bypass surgery, she will need B-12 injection otherwise she would not have effective erythropoiesis I recommend vitamin B 12 injection with each iron replacement therapy for effective erythropoiesis  S/P gastric bypass This is a most likely culprit for her severe anemia and several mineral deficiencies. I will order vitamin D level to exclude vitamin D deficiency If she has persistent iron deficiency despite IV iron replacement, she might need repeat EGD to exclude bleeding at the anastomosis site  Vitamin D deficiency She is likely to have vitamin D deficiency due to gastric bypass surgery. I will call the patient with results and if she has severe vitamin D deficiency, she would need prescription strength vitamin  D replacement therapy    All questions were answered. The patient knows to call the clinic with any problems, questions or concerns. I spent 40 minutes counseling the patient face to face. The total time spent in the appointment was 55 minutes and more than 50% was on counseling.     [place], [place], MD 09/16/2015 11:45 AM

## 2015-09-16 NOTE — Assessment & Plan Note (Signed)
She is likely to have vitamin D deficiency due to gastric bypass surgery. I will call the patient with results and if she has severe vitamin D deficiency, she would need prescription strength vitamin D replacement therapy

## 2015-09-17 ENCOUNTER — Other Ambulatory Visit: Payer: Self-pay | Admitting: Hematology and Oncology

## 2015-09-17 ENCOUNTER — Telehealth: Payer: Self-pay | Admitting: *Deleted

## 2015-09-17 LAB — VITAMIN D 25 HYDROXY (VIT D DEFICIENCY, FRACTURES): Vitamin D, 25-Hydroxy: 24 ng/mL — ABNORMAL LOW (ref 30.0–100.0)

## 2015-09-17 LAB — VITAMIN B12: VITAMIN B 12: 177 pg/mL — AB (ref 211–946)

## 2015-09-17 MED ORDER — ERGOCALCIFEROL 1.25 MG (50000 UT) PO CAPS: 50000.0000 [IU] | ORAL_CAPSULE | ORAL | Status: DC

## 2015-09-17 NOTE — Telephone Encounter (Signed)
-----   Message from Heath Lark, MD sent at 09/17/2015  7:26 AM EDT ----- Regarding: vitamin D low Pls call in vitamin d 50,000 units weekly  Dispense 12, no refills Please let her know to start ----- Message -----    From: [facility]    Sent: 09/16/2015  11:08 AM      To: Heath Lark, MD

## 2015-09-17 NOTE — Telephone Encounter (Signed)
LVM for pt informing of low Vitamin D level and new Rx for weekly Vitamin D sent to [facility].  Asked her to call nurse back w/ any questions.

## 2015-09-23 ENCOUNTER — Ambulatory Visit (HOSPITAL_BASED_OUTPATIENT_CLINIC_OR_DEPARTMENT_OTHER): Payer: Medicaid Other

## 2015-09-23 ENCOUNTER — Ambulatory Visit: Payer: Medicaid Other

## 2015-09-23 VITALS — BP 119/65 | HR 72 | Temp 98.4°F | Resp 20

## 2015-09-23 DIAGNOSIS — D509 Iron deficiency anemia, unspecified: Secondary | ICD-10-CM

## 2015-09-23 DIAGNOSIS — E538 Deficiency of other specified B group vitamins: Secondary | ICD-10-CM

## 2015-09-23 MED ORDER — CYANOCOBALAMIN 1000 MCG/ML IJ SOLN
INTRAMUSCULAR | Status: AC
  Filled 2015-09-23: qty 1

## 2015-09-23 MED ORDER — CYANOCOBALAMIN 1000 MCG/ML IJ SOLN
1000.0000 ug | Freq: Once | INTRAMUSCULAR | Status: AC
  Administered 2015-09-23: 1000 ug via INTRAMUSCULAR

## 2015-09-23 MED ORDER — SODIUM CHLORIDE 0.9 % IV SOLN
Freq: Once | INTRAVENOUS | Status: AC
  Administered 2015-09-23: 10:00:00 via INTRAVENOUS

## 2015-09-23 MED ORDER — SODIUM CHLORIDE 0.9 % IV SOLN
510.0000 mg | Freq: Once | INTRAVENOUS | Status: AC
  Administered 2015-09-23: 510 mg via INTRAVENOUS
  Filled 2015-09-23: qty 17

## 2015-09-23 NOTE — Patient Instructions (Signed)
Ferumoxytol injection What is this medicine? FERUMOXYTOL is an iron complex. Iron is used to make healthy red blood cells, which carry oxygen and nutrients throughout the body. This medicine is used to treat iron deficiency anemia in people with chronic kidney disease. This medicine may be used for other purposes; ask your health care provider or pharmacist if you have questions. What should I tell my health care provider before I take this medicine? They need to know if you have any of these conditions: -anemia not caused by low iron levels -high levels of iron in the blood -magnetic resonance imaging (MRI) test scheduled -an unusual or allergic reaction to iron, other medicines, foods, dyes, or preservatives -pregnant or trying to get pregnant -breast-feeding How should I use this medicine? This medicine is for injection into a vein. It is given by a health care professional in a hospital or clinic setting. Talk to your pediatrician regarding the use of this medicine in children. Special care may be needed. Overdosage: If you think you have taken too much of this medicine contact a poison control center or emergency room at once. NOTE: This medicine is only for you. Do not share this medicine with others. What if I miss a dose? It is important not to miss your dose. Call your doctor or health care professional if you are unable to keep an appointment. What may interact with this medicine? This medicine may interact with the following medications: -other iron products This list may not describe all possible interactions. Give your health care provider a list of all the medicines, herbs, non-prescription drugs, or dietary supplements you use. Also tell them if you smoke, drink alcohol, or use illegal drugs. Some items may interact with your medicine. What should I watch for while using this medicine? Visit your doctor or healthcare professional regularly. Tell your doctor or healthcare  professional if your symptoms do not start to get better or if they get worse. You may need blood work done while you are taking this medicine. You may need to follow a special diet. Talk to your doctor. Foods that contain iron include: whole grains/cereals, dried fruits, beans, or peas, leafy green vegetables, and organ meats (liver, kidney). What side effects may I notice from receiving this medicine? Side effects that you should report to your doctor or health care professional as soon as possible: -allergic reactions like skin rash, itching or hives, swelling of the face, lips, or tongue -breathing problems -changes in blood pressure -feeling faint or lightheaded, falls -fever or chills -flushing, sweating, or hot feelings -swelling of the ankles or feet Side effects that usually do not require medical attention (Report these to your doctor or health care professional if they continue or are bothersome.): -diarrhea -headache -nausea, vomiting -stomach pain This list may not describe all possible side effects. Call your doctor for medical advice about side effects. You may report side effects to FDA at 1-800-FDA-1088. Where should I keep my medicine? This drug is given in a hospital or clinic and will not be stored at home. NOTE: This sheet is a summary. It may not cover all possible information. If you have questions about this medicine, talk to your doctor, pharmacist, or health care provider.    2016, Elsevier/Gold Standard. (2011-11-12 15:23:36)   Cyclophosphamide injection What is this medicine? CYCLOPHOSPHAMIDE (sye kloe FOSS fa mide) is a chemotherapy drug. It slows the growth of cancer cells. This medicine is used to treat many types of cancer like lymphoma,  myeloma, leukemia, breast cancer, and ovarian cancer, to name a few. This medicine may be used for other purposes; ask your health care provider or pharmacist if you have questions. What should I tell my health care  provider before I take this medicine? They need to know if you have any of these conditions: -blood disorders -history of other chemotherapy -infection -kidney disease -liver disease -recent or ongoing radiation therapy -tumors in the bone marrow -an unusual or allergic reaction to cyclophosphamide, other chemotherapy, other medicines, foods, dyes, or preservatives -pregnant or trying to get pregnant -breast-feeding How should I use this medicine? This drug is usually given as an injection into a vein or muscle or by infusion into a vein. It is administered in a hospital or clinic by a specially trained health care professional. Talk to your pediatrician regarding the use of this medicine in children. Special care may be needed. Overdosage: If you think you have taken too much of this medicine contact a poison control center or emergency room at once. NOTE: This medicine is only for you. Do not share this medicine with others. What if I miss a dose? It is important not to miss your dose. Call your doctor or health care professional if you are unable to keep an appointment. What may interact with this medicine? This medicine may interact with the following medications: -amiodarone -amphotericin B -azathioprine -certain antiviral medicines for HIV or AIDS such as protease inhibitors (e.g., indinavir, ritonavir) and zidovudine -certain blood pressure medications such as benazepril, captopril, enalapril, fosinopril, lisinopril, moexipril, monopril, perindopril, quinapril, ramipril, trandolapril -certain cancer medications such as anthracyclines (e.g., daunorubicin, doxorubicin), busulfan, cytarabine, paclitaxel, pentostatin, tamoxifen, trastuzumab -certain diuretics such as chlorothiazide, chlorthalidone, hydrochlorothiazide, indapamide, metolazone -certain medicines that treat or prevent blood clots like warfarin -certain muscle relaxants such as  succinylcholine -cyclosporine -etanercept -indomethacin -medicines to increase blood counts like filgrastim, pegfilgrastim, sargramostim -medicines used as general anesthesia -metronidazole -natalizumab This list may not describe all possible interactions. Give your health care provider a list of all the medicines, herbs, non-prescription drugs, or dietary supplements you use. Also tell them if you smoke, drink alcohol, or use illegal drugs. Some items may interact with your medicine. What should I watch for while using this medicine? Visit your doctor for checks on your progress. This drug may make you feel generally unwell. This is not uncommon, as chemotherapy can affect healthy cells as well as cancer cells. Report any side effects. Continue your course of treatment even though you feel ill unless your doctor tells you to stop. Drink water or other fluids as directed. Urinate often, even at night. In some cases, you may be given additional medicines to help with side effects. Follow all directions for their use. Call your doctor or health care professional for advice if you get a fever, chills or sore throat, or other symptoms of a cold or flu. Do not treat yourself. This drug decreases your body's ability to fight infections. Try to avoid being around people who are sick. This medicine may increase your risk to bruise or bleed. Call your doctor or health care professional if you notice any unusual bleeding. Be careful brushing and flossing your teeth or using a toothpick because you may get an infection or bleed more easily. If you have any dental work done, tell your dentist you are receiving this medicine. You may get drowsy or dizzy. Do not drive, use machinery, or do anything that needs mental alertness until you know how this  medicine affects you. Do not become pregnant while taking this medicine or for 1 year after stopping it. Women should inform their doctor if they wish to become  pregnant or think they might be pregnant. Men should not father a child while taking this medicine and for 4 months after stopping it. There is a potential for serious side effects to an unborn child. Talk to your health care professional or pharmacist for more information. Do not breast-feed an infant while taking this medicine. This medicine may interfere with the ability to have a child. This medicine has caused ovarian failure in some women. This medicine has caused reduced sperm counts in some men. You should talk with your doctor or health care professional if you are concerned about your fertility. If you are going to have surgery, tell your doctor or health care professional that you have taken this medicine. What side effects may I notice from receiving this medicine? Side effects that you should report to your doctor or health care professional as soon as possible: -allergic reactions like skin rash, itching or hives, swelling of the face, lips, or tongue -low blood counts - this medicine may decrease the number of white blood cells, red blood cells and platelets. You may be at increased risk for infections and bleeding. -signs of infection - fever or chills, cough, sore throat, pain or difficulty passing urine -signs of decreased platelets or bleeding - bruising, pinpoint red spots on the skin, black, tarry stools, blood in the urine -signs of decreased red blood cells - unusually weak or tired, fainting spells, lightheadedness -breathing problems -dark urine -dizziness -palpitations -swelling of the ankles, feet, hands -trouble passing urine or change in the amount of urine -weight gain -yellowing of the eyes or skin Side effects that usually do not require medical attention (report to your doctor or health care professional if they continue or are bothersome): -changes in nail or skin color -hair loss -missed menstrual periods -mouth sores -nausea, vomiting This list may not  describe all possible side effects. Call your doctor for medical advice about side effects. You may report side effects to FDA at 1-800-FDA-1088. Where should I keep my medicine? This drug is given in a hospital or clinic and will not be stored at home. NOTE: This sheet is a summary. It may not cover all possible information. If you have questions about this medicine, talk to your doctor, pharmacist, or health care provider.    2016, Elsevier/Gold Standard. (2012-02-11 16:22:58)

## 2015-09-23 NOTE — Progress Notes (Signed)
To be completed in Infusion Room 

## 2015-09-30 ENCOUNTER — Ambulatory Visit (HOSPITAL_BASED_OUTPATIENT_CLINIC_OR_DEPARTMENT_OTHER): Payer: Medicaid Other

## 2015-09-30 ENCOUNTER — Ambulatory Visit: Payer: Medicaid Other

## 2015-09-30 VITALS — BP 109/64 | HR 64 | Temp 98.5°F | Resp 18

## 2015-09-30 DIAGNOSIS — E538 Deficiency of other specified B group vitamins: Secondary | ICD-10-CM | POA: Diagnosis not present

## 2015-09-30 DIAGNOSIS — D509 Iron deficiency anemia, unspecified: Secondary | ICD-10-CM

## 2015-09-30 MED ORDER — SODIUM CHLORIDE 0.9 % IV SOLN
510.0000 mg | Freq: Once | INTRAVENOUS | Status: AC
  Administered 2015-09-30: 510 mg via INTRAVENOUS
  Filled 2015-09-30: qty 17

## 2015-09-30 MED ORDER — CYANOCOBALAMIN 1000 MCG/ML IJ SOLN
1000.0000 ug | Freq: Once | INTRAMUSCULAR | Status: AC
  Administered 2015-09-30: 1000 ug via INTRAMUSCULAR

## 2015-09-30 MED ORDER — SODIUM CHLORIDE 0.9 % IV SOLN
Freq: Once | INTRAVENOUS | Status: AC
  Administered 2015-09-30: 13:00:00 via INTRAVENOUS

## 2015-09-30 MED ORDER — CYANOCOBALAMIN 1000 MCG/ML IJ SOLN
INTRAMUSCULAR | Status: AC
  Filled 2015-09-30: qty 1

## 2015-09-30 NOTE — Progress Notes (Signed)
Completed in Infusion Room.

## 2015-09-30 NOTE — Patient Instructions (Signed)
Ferumoxytol injection What is this medicine? FERUMOXYTOL is an iron complex. Iron is used to make healthy red blood cells, which carry oxygen and nutrients throughout the body. This medicine is used to treat iron deficiency anemia in people with chronic kidney disease. This medicine may be used for other purposes; ask your health care provider or pharmacist if you have questions. What should I tell my health care provider before I take this medicine? They need to know if you have any of these conditions: -anemia not caused by low iron levels -high levels of iron in the blood -magnetic resonance imaging (MRI) test scheduled -an unusual or allergic reaction to iron, other medicines, foods, dyes, or preservatives -pregnant or trying to get pregnant -breast-feeding How should I use this medicine? This medicine is for injection into a vein. It is given by a health care professional in a hospital or clinic setting. Talk to your pediatrician regarding the use of this medicine in children. Special care may be needed. Overdosage: If you think you have taken too much of this medicine contact a poison control center or emergency room at once. NOTE: This medicine is only for you. Do not share this medicine with others. What if I miss a dose? It is important not to miss your dose. Call your doctor or health care professional if you are unable to keep an appointment. What may interact with this medicine? This medicine may interact with the following medications: -other iron products This list may not describe all possible interactions. Give your health care provider a list of all the medicines, herbs, non-prescription drugs, or dietary supplements you use. Also tell them if you smoke, drink alcohol, or use illegal drugs. Some items may interact with your medicine. What should I watch for while using this medicine? Visit your doctor or healthcare professional regularly. Tell your doctor or healthcare  professional if your symptoms do not start to get better or if they get worse. You may need blood work done while you are taking this medicine. You may need to follow a special diet. Talk to your doctor. Foods that contain iron include: whole grains/cereals, dried fruits, beans, or peas, leafy green vegetables, and organ meats (liver, kidney). What side effects may I notice from receiving this medicine? Side effects that you should report to your doctor or health care professional as soon as possible: -allergic reactions like skin rash, itching or hives, swelling of the face, lips, or tongue -breathing problems -changes in blood pressure -feeling faint or lightheaded, falls -fever or chills -flushing, sweating, or hot feelings -swelling of the ankles or feet Side effects that usually do not require medical attention (Report these to your doctor or health care professional if they continue or are bothersome.): -diarrhea -headache -nausea, vomiting -stomach pain This list may not describe all possible side effects. Call your doctor for medical advice about side effects. You may report side effects to FDA at 1-800-FDA-1088. Where should I keep my medicine? This drug is given in a hospital or clinic and will not be stored at home. NOTE: This sheet is a summary. It may not cover all possible information. If you have questions about this medicine, talk to your doctor, pharmacist, or health care provider.    2016, Elsevier/Gold Standard. (2011-11-12 15:23:36) Cyanocobalamin, Vitamin B12 injection What is this medicine? CYANOCOBALAMIN (sye an oh koe BAL a min) is a man made form of vitamin B12. Vitamin B12 is used in the growth of healthy blood cells, nerve cells,   cells, and proteins in the body. It also helps with the metabolism of fats and carbohydrates. This medicine is used to treat people who can not absorb vitamin B12. This medicine may be used for other purposes; ask your health care provider or  pharmacist if you have questions. What should I tell my health care provider before I take this medicine? They need to know if you have any of these conditions: -kidney disease -Leber's disease -megaloblastic anemia -an unusual or allergic reaction to cyanocobalamin, cobalt, other medicines, foods, dyes, or preservatives -pregnant or trying to get pregnant -breast-feeding How should I use this medicine? This medicine is injected into a muscle or deeply under the skin. It is usually given by a health care professional in a clinic or doctor's office. However, your doctor may teach you how to inject yourself. Follow all instructions. Talk to your pediatrician regarding the use of this medicine in children. Special care may be needed. Overdosage: If you think you have taken too much of this medicine contact a poison control center or emergency room at once. NOTE: This medicine is only for you. Do not share this medicine with others. What if I miss a dose? If you are given your dose at a clinic or doctor's office, call to reschedule your appointment. If you give your own injections and you miss a dose, take it as soon as you can. If it is almost time for your next dose, take only that dose. Do not take double or extra doses. What may interact with this medicine? -colchicine -heavy alcohol intake This list may not describe all possible interactions. Give your health care provider a list of all the medicines, herbs, non-prescription drugs, or dietary supplements you use. Also tell them if you smoke, drink alcohol, or use illegal drugs. Some items may interact with your medicine. What should I watch for while using this medicine? Visit your doctor or health care professional regularly. You may need blood work done while you are taking this medicine. You may need to follow a special diet. Talk to your doctor. Limit your alcohol intake and avoid smoking to get the best benefit. What side effects may I  notice from receiving this medicine? Side effects that you should report to your doctor or health care professional as soon as possible: -allergic reactions like skin rash, itching or hives, swelling of the face, lips, or tongue -blue tint to skin -chest tightness, pain -difficulty breathing, wheezing -dizziness -red, swollen painful area on the leg Side effects that usually do not require medical attention (report to your doctor or health care professional if they continue or are bothersome): -diarrhea -headache This list may not describe all possible side effects. Call your doctor for medical advice about side effects. You may report side effects to FDA at 1-800-FDA-1088. Where should I keep my medicine? Keep out of the reach of children. Store at room temperature between 15 and 30 degrees C (59 and 85 degrees F). Protect from light. Throw away any unused medicine after the expiration date. NOTE: This sheet is a summary. It may not cover all possible information. If you have questions about this medicine, talk to your doctor, pharmacist, or health care provider.    2016, Elsevier/Gold Standard. (2007-07-10 22:10:20)

## 2015-10-28 ENCOUNTER — Other Ambulatory Visit (HOSPITAL_BASED_OUTPATIENT_CLINIC_OR_DEPARTMENT_OTHER): Payer: Medicaid Other

## 2015-10-28 DIAGNOSIS — E559 Vitamin D deficiency, unspecified: Secondary | ICD-10-CM | POA: Diagnosis not present

## 2015-10-28 DIAGNOSIS — D509 Iron deficiency anemia, unspecified: Secondary | ICD-10-CM | POA: Diagnosis not present

## 2015-10-28 DIAGNOSIS — E538 Deficiency of other specified B group vitamins: Secondary | ICD-10-CM

## 2015-10-28 DIAGNOSIS — Z9884 Bariatric surgery status: Secondary | ICD-10-CM

## 2015-10-28 LAB — CBC & DIFF AND RETIC
BASO%: 0.2 % (ref 0.0–2.0)
BASOS ABS: 0 10*3/uL (ref 0.0–0.1)
EOS%: 2.1 % (ref 0.0–7.0)
Eosinophils Absolute: 0.1 10*3/uL (ref 0.0–0.5)
HEMATOCRIT: 43.7 % (ref 34.8–46.6)
HEMOGLOBIN: 13.9 g/dL (ref 11.6–15.9)
Immature Retic Fract: 5.6 % (ref 1.60–10.00)
LYMPH#: 1.8 10*3/uL (ref 0.9–3.3)
LYMPH%: 29.4 % (ref 14.0–49.7)
MCH: 25 pg — ABNORMAL LOW (ref 25.1–34.0)
MCHC: 31.8 g/dL (ref 31.5–36.0)
MCV: 78.6 fL — ABNORMAL LOW (ref 79.5–101.0)
MONO#: 0.4 10*3/uL (ref 0.1–0.9)
MONO%: 6.3 % (ref 0.0–14.0)
NEUT#: 3.9 10*3/uL (ref 1.5–6.5)
NEUT%: 62 % (ref 38.4–76.8)
Platelets: 196 10*3/uL (ref 145–400)
RBC: 5.56 10*6/uL — ABNORMAL HIGH (ref 3.70–5.45)
RETIC %: 0.4 % — AB (ref 0.70–2.10)
RETIC CT ABS: 22.24 10*3/uL — AB (ref 33.70–90.70)
WBC: 6.2 10*3/uL (ref 3.9–10.3)
nRBC: 0 % (ref 0–0)

## 2015-10-28 LAB — FERRITIN: Ferritin: 34 ng/ml (ref 9–269)

## 2015-10-30 ENCOUNTER — Other Ambulatory Visit: Payer: Self-pay | Admitting: Hematology and Oncology

## 2015-11-04 ENCOUNTER — Telehealth: Payer: Self-pay | Admitting: Hematology and Oncology

## 2015-11-04 ENCOUNTER — Ambulatory Visit (HOSPITAL_BASED_OUTPATIENT_CLINIC_OR_DEPARTMENT_OTHER): Payer: Medicaid Other | Admitting: Hematology and Oncology

## 2015-11-04 ENCOUNTER — Ambulatory Visit: Payer: Medicaid Other

## 2015-11-04 ENCOUNTER — Encounter: Payer: Self-pay | Admitting: Hematology and Oncology

## 2015-11-04 ENCOUNTER — Ambulatory Visit (HOSPITAL_BASED_OUTPATIENT_CLINIC_OR_DEPARTMENT_OTHER): Payer: Medicaid Other

## 2015-11-04 VITALS — BP 112/68 | HR 64 | Temp 98.7°F | Resp 16

## 2015-11-04 VITALS — BP 130/69 | HR 76 | Temp 98.3°F | Resp 18 | Ht 66.0 in | Wt 231.2 lb

## 2015-11-04 DIAGNOSIS — Z9884 Bariatric surgery status: Secondary | ICD-10-CM | POA: Diagnosis not present

## 2015-11-04 DIAGNOSIS — K909 Intestinal malabsorption, unspecified: Secondary | ICD-10-CM | POA: Diagnosis not present

## 2015-11-04 DIAGNOSIS — D509 Iron deficiency anemia, unspecified: Secondary | ICD-10-CM | POA: Diagnosis not present

## 2015-11-04 DIAGNOSIS — E538 Deficiency of other specified B group vitamins: Secondary | ICD-10-CM

## 2015-11-04 DIAGNOSIS — E559 Vitamin D deficiency, unspecified: Secondary | ICD-10-CM

## 2015-11-04 MED ORDER — SODIUM CHLORIDE 0.9 % IV SOLN
510.0000 mg | Freq: Once | INTRAVENOUS | Status: AC
  Administered 2015-11-04: 510 mg via INTRAVENOUS
  Filled 2015-11-04: qty 17

## 2015-11-04 MED ORDER — CYANOCOBALAMIN 1000 MCG/ML IJ SOLN
1000.0000 ug | Freq: Once | INTRAMUSCULAR | Status: AC
  Administered 2015-11-04: 1000 ug via INTRAMUSCULAR

## 2015-11-04 MED ORDER — CYANOCOBALAMIN 1000 MCG/ML IJ SOLN
1000.0000 ug | Freq: Once | INTRAMUSCULAR | Status: DC
Start: 2015-11-04 — End: 2015-11-04

## 2015-11-04 MED ORDER — CYANOCOBALAMIN 1000 MCG/ML IJ SOLN
INTRAMUSCULAR | Status: AC
Start: 2015-11-04 — End: 2015-11-04
  Filled 2015-11-04: qty 1

## 2015-11-04 MED ORDER — SODIUM CHLORIDE 0.9 % IV SOLN
Freq: Once | INTRAVENOUS | Status: AC
  Administered 2015-11-04: 12:00:00 via INTRAVENOUS

## 2015-11-04 NOTE — Assessment & Plan Note (Signed)
Her previous B-12 level was low and with her history of gastric bypass surgery, she will need B-12 injection otherwise she would not have effective erythropoiesis I recommend vitamin B 12 injection with each iron replacement therapy for effective erythropoiesis

## 2015-11-04 NOTE — Patient Instructions (Signed)
[facility] Discharge Instructions for Patients Receiving Chemotherapy  Today you received the following chemotherapy agents Feraheme.  To help prevent nausea and vomiting after your treatment, we encourage you to take your nausea medication as directed.  If you develop nausea and vomiting that is not controlled by your nausea medication, call the clinic.   BELOW ARE SYMPTOMS THAT SHOULD BE REPORTED IMMEDIATELY:  *FEVER GREATER THAN 100.5 F  *CHILLS WITH OR WITHOUT FEVER  NAUSEA AND VOMITING THAT IS NOT CONTROLLED WITH YOUR NAUSEA MEDICATION  *UNUSUAL SHORTNESS OF BREATH  *UNUSUAL BRUISING OR BLEEDING  TENDERNESS IN MOUTH AND THROAT WITH OR WITHOUT PRESENCE OF ULCERS  *URINARY PROBLEMS  *BOWEL PROBLEMS  UNUSUAL RASH Items with * indicate a potential emergency and should be followed up as soon as possible.  Feel free to call the clinic you have any questions or concerns. The clinic phone number is [phone].  Please show the [facility] at check-in to the Emergency Department and triage nurse.

## 2015-11-04 NOTE — Assessment & Plan Note (Signed)
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 We discussed some of the risks, benefits, and alternatives of intravenous iron infusions. The patient is symptomatic from anemia and the iron level is critically low. She tolerated oral iron supplement poorly and desires to achieved higher levels of iron faster for adequate hematopoesis. Some of the side-effects to be expected including risks of infusion reactions, phlebitis, headaches, nausea and fatigue.  The patient is willing to proceed. Patient education material was dispensed.  Goal is to keep ferritin level greater than 50  

## 2015-11-04 NOTE — Progress Notes (Signed)
[place], [place] SUMMARY OF HEMATOLOGIC HISTORY:  Severe iron deficiency anemia  HISTORY OF PRESENTING ILLNESS:  Carrie Douglas 40 y.o. female is here because of severe iron deficiency anemia. 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 She had gastric bypass surgery after the birth of her daughter. She has 6 children in total. The range from 38-year-old to 41 year old. 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 She has received numerous blood transfusions over the past 7 years and also intermittent IV iron replacement therapy. She moved from [place] to [place] 5 years ago and has no consistent follow-up with hematologist.  She was found to have abnormal CBC from recent blood work. CBC dated back the past few years range from as low as 6.2 to as high as 10.8 She went to the emergency department recently complaining of dizziness. She complained of chest discomfort on exertion, shortness of breath on minimal exertion, pre-syncopal episodes, and palpitations. She had not noticed any recent bleeding such as epistaxis, hematuria or hematochezia The patient denies regular over the counter NSAID ingestion. She takes ibuprofen once or twice a month only.She is not on antiplatelets agents.  She had no prior history or diagnosis of cancer. Her age appropriate screening programs are up-to-date. She has significant pica with excessive chewing of ice but eats a variety of diet. She never donated blood The patient was prescribed oral iron supplements and she takes consistently 3 times a day with food. The iron pill causes significant constipation. She received multiple mineral replacement therapies in June 2017 INTERVAL HISTORY: Carrie Douglas 40 y.o. female returns for follow-up She is compliant taking vitamin supplements She has no infusion reactions but had mild  extravasation on her skin. The patient denies any recent signs or symptoms of bleeding such as spontaneous epistaxis, hematuria or hematochezia. She has improvement of energy levels after recent iron therapy  I have reviewed the past medical history, past surgical history, social history and family history with the patient and they are unchanged from previous note.  ALLERGIES:  is allergic to codeine and other.  MEDICATIONS:  Current Outpatient Prescriptions  Medication Sig Dispense Refill  . acetaminophen (TYLENOL) 500 MG tablet Take 1,000 mg by mouth every 6 (six) hours as needed for moderate pain.    . calcium-vitamin D (OSCAL WITH D) 500-200 MG-UNIT per tablet Take 1 tablet by mouth daily with breakfast.    . carbamazepine (TEGRETOL) 200 MG tablet Take 1-2 tablets (200-400 mg total) by mouth 2 (two) times daily. 360 tablet 4  . diclofenac sodium (VOLTAREN) 1 % GEL Apply 2 g topically 4 (four) times daily.    . ergocalciferol (VITAMIN D2) 50000 units capsule Take 1 capsule (50,000 Units total) by mouth once a week. 12 capsule 0  . ferrous sulfate 325 (65 FE) MG tablet Take 1 tablet (325 mg total) by mouth 2 (two) times daily with a meal. 60 tablet 3  . fluconazole (DIFLUCAN) 150 MG tablet Take 1 tablet (150 mg total) by mouth once. 1 tablet 0  . gabapentin (NEURONTIN) 100 MG capsule Take 1 capsule (100 mg total) by mouth 3 (three) times daily as needed (pain). 30 capsule 0  . HYDROcodone-acetaminophen (NORCO/VICODIN) 5-325 MG tablet Take 2 tablets by mouth every 4 (four) hours as needed. 15 tablet 0  . ibuprofen (ADVIL,MOTRIN) 200 MG tablet Take 600 mg by mouth every 6 (six) hours as  needed for moderate pain.     . meclizine (ANTIVERT) 50 MG tablet Take 1 tablet (50 mg total) by mouth 3 (three) times daily as needed. 30 tablet 0  . Multiple Vitamins-Minerals (MULTIVITAMIN ADULT PO) Take 1 tablet by mouth daily.    . ondansetron (ZOFRAN ODT) 8 MG disintegrating tablet Take 1 tablet (8 mg  total) by mouth every 8 (eight) hours as needed for nausea or vomiting. 10 tablet 0  . ondansetron (ZOFRAN) 4 MG tablet Take 1 tablet (4 mg total) by mouth every 6 (six) hours. 20 tablet 0   No current facility-administered medications for this visit.      REVIEW OF SYSTEMS:   Constitutional: Denies fevers, chills or night sweats Eyes: Denies blurriness of vision Ears, nose, mouth, throat, and face: Denies mucositis or sore throat Respiratory: Denies cough, dyspnea or wheezes Cardiovascular: Denies palpitation, chest discomfort or lower extremity swelling Gastrointestinal:  Denies nausea, heartburn or change in bowel habits Skin: Denies abnormal skin rashes Lymphatics: Denies new lymphadenopathy or easy bruising Neurological:Denies numbness, tingling or new weaknesses Behavioral/Psych: Mood is stable, no new changes  All other systems were reviewed with the patient and are negative.  PHYSICAL EXAMINATION: ECOG PERFORMANCE STATUS: 0 - Asymptomatic  Vitals:   11/04/15 1057  BP: 130/69  Pulse: 76  Resp: 18  Temp: 98.3 F (36.8 C)   Filed Weights   11/04/15 1057  Weight: 231 lb 3.2 oz (104.9 kg)    GENERAL:alert, no distress and comfortable SKIN: skin color, texture, turgor are normal, no rashes or significant lesions. Noted iron staining on her skin EYES: normal, Conjunctiva are pink and non-injected, sclera clear Musculoskeletal:no cyanosis of digits and no clubbing  NEURO: alert & oriented x 3 with fluent speech, no focal motor/sensory deficits  LABORATORY DATA:  I have reviewed the data as listed     Component Value Date/Time   NA 137 09/10/2015 1317   NA 141 07/29/2015 1344   K 4.1 09/10/2015 1317   CL 109 09/10/2015 1317   CO2 22 09/10/2015 1317   GLUCOSE 132 (H) 09/10/2015 1317   BUN 10 09/10/2015 1317   BUN 11 07/29/2015 1344   CREATININE 0.65 09/10/2015 1317   CALCIUM 8.9 09/10/2015 1317   PROT 6.6 09/10/2015 1317   PROT 6.8 07/29/2015 1344   ALBUMIN 3.8  09/10/2015 1317   ALBUMIN 4.3 07/29/2015 1344   AST 15 09/10/2015 1317   ALT 9 (L) 09/10/2015 1317   ALKPHOS 68 09/10/2015 1317   BILITOT 0.1 (L) 09/10/2015 1317   BILITOT <0.2 07/29/2015 1344   GFRNONAA >60 09/10/2015 1317   GFRAA >60 09/10/2015 1317    No results found for: SPEP, UPEP  Lab Results  Component Value Date   WBC 6.2 10/28/2015   NEUTROABS 3.9 10/28/2015   HGB 13.9 10/28/2015   HCT 43.7 10/28/2015   MCV 78.6 (L) 10/28/2015   PLT 196 10/28/2015      Chemistry      Component Value Date/Time   NA 137 09/10/2015 1317   NA 141 07/29/2015 1344   K 4.1 09/10/2015 1317   CL 109 09/10/2015 1317   CO2 22 09/10/2015 1317   BUN 10 09/10/2015 1317   BUN 11 07/29/2015 1344   CREATININE 0.65 09/10/2015 1317      Component Value Date/Time   CALCIUM 8.9 09/10/2015 1317   ALKPHOS 68 09/10/2015 1317   AST 15 09/10/2015 1317   ALT 9 (L) 09/10/2015 1317   BILITOT 0.1 (L)  09/10/2015 1317   BILITOT <0.2 07/29/2015 1344       ASSESSMENT & PLAN:  Iron deficiency anemia The most likely cause of her anemia is due to malabsorption syndrome. We discussed some of the risks, benefits, and alternatives of intravenous iron infusions. The patient is symptomatic from anemia and the iron level is critically low. She tolerated oral iron supplement poorly and desires to achieved higher levels of iron faster for adequate hematopoesis. Some of the side-effects to be expected including risks of infusion reactions, phlebitis, headaches, nausea and fatigue.  The patient is willing to proceed. Patient education material was dispensed.  Goal is to keep ferritin level greater than 50   Vitamin B12 deficiency Her previous B-12 level was low and with her history of gastric bypass surgery, she will need B-12 injection otherwise she would not have effective erythropoiesis I recommend vitamin B 12 injection with each iron replacement therapy for effective erythropoiesis  S/P gastric bypass This  is a most likely culprit for her severe anemia and several mineral deficiencies. If she has persistent iron deficiency despite IV iron replacement, she might need repeat EGD to exclude bleeding at the anastomosis site  Vitamin D deficiency She is likely to have vitamin D deficiency due to gastric bypass surgery. She will continue vitamin D replacement therapy   All questions were answered. The patient knows to call the clinic with any problems, questions or concerns. No barriers to learning was detected.  I spent 15 minutes counseling the patient face to face. The total time spent in the appointment was 20 minutes and more than 50% was on counseling.     [facility], Tywone Bembenek, MD 7/25/20179:28 PM

## 2015-11-04 NOTE — Assessment & Plan Note (Signed)
She is likely to have vitamin D deficiency due to gastric bypass surgery. She will continue vitamin D replacement therapy

## 2015-11-04 NOTE — Telephone Encounter (Signed)
Pt will get updated sched in tx room °

## 2015-11-04 NOTE — Assessment & Plan Note (Signed)
This is a most likely culprit for her severe anemia and several mineral deficiencies. If she has persistent iron deficiency despite IV iron replacement, she might need repeat EGD to exclude bleeding at the anastomosis site

## 2015-11-04 NOTE — Patient Instructions (Signed)

## 2015-11-04 NOTE — Progress Notes (Signed)
Injection to be completed in Infusion Room

## 2016-01-09 ENCOUNTER — Encounter (HOSPITAL_COMMUNITY): Payer: Self-pay | Admitting: Emergency Medicine

## 2016-01-09 ENCOUNTER — Ambulatory Visit (HOSPITAL_COMMUNITY)
Admission: EM | Admit: 2016-01-09 | Discharge: 2016-01-09 | Disposition: A | Discharge status: Home / Self Care | Payer: Medicaid Other | Attending: Family Medicine | Admitting: Family Medicine

## 2016-01-09 ENCOUNTER — Ambulatory Visit (INDEPENDENT_AMBULATORY_CARE_PROVIDER_SITE_OTHER): Payer: Medicaid Other

## 2016-01-09 DIAGNOSIS — S93402A Sprain of unspecified ligament of left ankle, initial encounter: Secondary | ICD-10-CM

## 2016-01-09 MED ORDER — KETOROLAC TROMETHAMINE 60 MG/2ML IM SOLN
INTRAMUSCULAR | Status: AC
  Filled 2016-01-09: qty 2

## 2016-01-09 MED ORDER — KETOROLAC TROMETHAMINE 60 MG/2ML IM SOLN
60.0000 mg | Freq: Once | INTRAMUSCULAR | Status: AC
  Administered 2016-01-09: 60 mg via INTRAMUSCULAR

## 2016-01-09 NOTE — ED Triage Notes (Signed)
Pt reports she twisted left ankle 3 days ago while going down steps  Sx today include; swelling and pain  A&O x4... NAD

## 2016-01-09 NOTE — Discharge Instructions (Signed)
Wear the ankle splint/support as needed for comfort with weight bearing. Take Ibuprofen 600-800 mg three times a day for 5 days then only as needed. If pain persist after a week arrnage follow up with the orthopedic referral provided.

## 2016-01-09 NOTE — ED Provider Notes (Signed)
CSN: [id number]     Arrival date & time 01/09/16  1550 History   None    Chief Complaint  Patient presents with  . Ankle Pain   (Consider location/radiation/quality/duration/timing/severity/associated sxs/prior Treatment) The history is provided by the patient.  Ankle Pain  Location:  Ankle Time since incident:  3 days Injury: yes   Mechanism of injury: fall   Fall:    Fall occurred:  Down stairs   Height of fall:  3-4 ft   Impact surface:  Hard floor   Point of impact:  Unable to specify Ankle location:  L ankle Pain details:    Quality:  Pressure, throbbing and sharp   Onset quality:  Gradual   Duration:  3 days   Timing:  Constant   Progression:  Worsening Chronicity:  New Dislocation: no   Relieved by:  Nothing Worsened by:  Bearing weight Ineffective treatments:  Acetaminophen, ice and heat Risk factors: obesity   Risk factors: no concern for non-accidental trauma, no frequent fractures, no known bone disorder and no recent illness     Past Medical History:  Diagnosis Date  . Anemia    blood transfusion 09/14/10  . Anxiety   . Arthritis    "knees" (07/02/2014)  . Depression   . 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 Iron deficiency anemia   . Rubella    5 months old  . Sleep apnea   . Vaginal delivery 1997, 1999, 2002, 2006, 2008, 2012  . Varicella    21 1/41 years old   Past Surgical History:  Procedure Laterality Date  . ABDOMINAL HYSTERECTOMY    . CHOLECYSTECTOMY OPEN  2007  . DILATION AND CURETTAGE OF UTERUS  07/2013   "not a D&E"  . DILATION AND EVACUATION N/A 07/13/2013   Procedure: DILATATION AND EVACUATION;  Surgeon: Woodroe Mode, MD;  Location: [facility];  Service: Gynecology;  Laterality: N/A;  . LAPAROSCOPIC ASSISTED VAGINAL HYSTERECTOMY N/A 09/17/2014   Procedure: DIAGNOSTIC LAPAROSCOPIC, VAGINAL HYSTERECTOMY;  Surgeon: Osborne Oman, MD;  Location: [facility];  Service:  Gynecology;  Laterality: N/A;  . LAPAROSCOPIC BILATERAL SALPINGECTOMY Bilateral 10/10/2013   Procedure: LAPAROSCOPIC BILATERAL SALPINGECTOMY;  Surgeon: Osborne Oman, MD;  Location: [facility];  Service: Gynecology;  Laterality: Bilateral;  with single site port  . ROUX-EN-Y GASTRIC BYPASS  2004  . SALPINGOOPHORECTOMY Bilateral 08/2013   Family History  Problem Relation Age of Onset  . Lung cancer Father   . Hypertension Father   . Heart disease Maternal Grandmother   . Heart disease Maternal Grandfather   . Heart disease Paternal Grandmother   . Heart disease Paternal Grandfather    Social History  Substance Use Topics  . Smoking status: Current Every Day Smoker    Packs/day: 0.25    Years: 5.00    Types: Cigarettes  . Smokeless tobacco: Never Used     Comment: 04/30/15 10 cigs daily  . Alcohol use No   OB History    Gravida Para Term Preterm AB Living   6 6 6  0 0 6   SAB TAB Ectopic Multiple Live Births   0 0 0 0 6     Review of Systems  All other systems reviewed and are negative.   Allergies  Codeine and Other  Home Medications   Prior to Admission medications   Medication Sig Start Date End Date Taking? Authorizing Provider  acetaminophen (TYLENOL) 500 MG tablet Take 1,000 mg by mouth every 6 (six) hours as needed for moderate pain.    Historical Provider, MD  calcium-vitamin D (OSCAL WITH D) 500-200 MG-UNIT per tablet Take 1 tablet by mouth daily with breakfast.    Historical Provider, MD  carbamazepine (TEGRETOL) 200 MG tablet Take 1-2 tablets (200-400 mg total) by mouth 2 (two) times daily. 07/29/15   Penni Bombard, MD  diclofenac sodium (VOLTAREN) 1 % GEL Apply 2 g topically 4 (four) times daily.    Historical Provider, MD  ergocalciferol (VITAMIN D2) 50000 units capsule Take 1 capsule (50,000 Units total) by mouth once a week. 09/17/15   Heath Lark, MD  ferrous sulfate 325 (65 FE) MG tablet Take 1 tablet (325 mg total) by mouth 2 (two) times daily with a meal.  07/03/14   Domenic Polite, MD  fluconazole (DIFLUCAN) 150 MG tablet Take 1 tablet (150 mg total) by mouth once. 05/15/15   Osborne Oman, MD  gabapentin (NEURONTIN) 100 MG capsule Take 1 capsule (100 mg total) by mouth 3 (three) times daily as needed (pain). 04/26/15   Clayton Bibles, PA-C  HYDROcodone-acetaminophen (NORCO/VICODIN) 5-325 MG tablet Take 2 tablets by mouth every 4 (four) hours as needed. 04/08/15   Esaw Grandchild, MD  ibuprofen (ADVIL,MOTRIN) 200 MG tablet Take 600 mg by mouth every 6 (six) hours as needed for moderate pain.     Historical Provider, MD  meclizine (ANTIVERT) 50 MG tablet Take 1 tablet (50 mg total) by mouth 3 (three) times daily as needed. 04/03/15   Tatyana Kirichenko, PA-C  Multiple Vitamins-Minerals (MULTIVITAMIN ADULT PO) Take 1 tablet by mouth daily.    Historical Provider, MD  ondansetron (ZOFRAN ODT) 8 MG disintegrating tablet Take 1 tablet (8 mg total) by mouth every 8 (eight) hours as needed for nausea or vomiting. 04/03/15   Tatyana Kirichenko, PA-C  ondansetron (ZOFRAN) 4 MG tablet Take 1 tablet (4 mg total) by mouth every 6 (six) hours. 04/08/15   Esaw Grandchild, MD   Meds Ordered and Administered this Visit   Medications  ketorolac (TORADOL) injection 60 mg (60 mg Intramuscular Given 01/09/16 1823)    BP 121/74 (BP Location: Left Arm)   Pulse 73   Temp 98.1 F (36.7 C) (Oral)   Resp 20   LMP 08/22/2014 (Exact Date)   SpO2 97%  No data found.   Physical Exam  Constitutional: She is oriented to person, place, and time. She appears well-developed and well-nourished.  HENT:  Head: Normocephalic and atraumatic.  Cardiovascular: Normal rate.   Pulmonary/Chest: Effort normal.  Musculoskeletal:  Swelling over the medial and lateral malleolas of (L) foot. No obvious deformity or open wound.  Neurological: She is alert and oriented to person, place, and time.  Skin: Skin is dry.  Psychiatric: She has a normal mood and affect.    Urgent Care Course    Clinical Course    Procedures (including critical care time)  Labs Review Labs Reviewed - No data to display  Imaging Review Dg Ankle Complete Left  Result Date: 01/09/2016 CLINICAL DATA:  Fall, injury to left ankle. EXAM: LEFT ANKLE COMPLETE - 3+ VIEW COMPARISON:  None. FINDINGS: Osseous alignment is normal. Bone mineralization is normal. No fracture line or displaced fracture fragment identified. Ankle mortise is symmetric. Visualized portions of the hindfoot and midfoot appear intact and normally aligned. Adjacent soft tissues are unremarkable. IMPRESSION: Negative. Electronically Signed   By: Franki Cabot M.D.   On:  01/09/2016 18:07     Visual Acuity Review  Right Eye Distance:   Left Eye Distance:   Bilateral Distance:    Right Eye Near:   Left Eye Near:    Bilateral Near:         MDM   1. Left ankle sprain, initial encounter   Fall 3 days ago. Persistent pain and swelling to (L) ankle. Xrays negative for fx. Pt placed in ASO splint and provided an ortho referral for f/u if needed. Home care discussed and provided in print.    Jeryl Columbia, NP 01/09/16 1916

## 2016-01-27 ENCOUNTER — Ambulatory Visit (INDEPENDENT_AMBULATORY_CARE_PROVIDER_SITE_OTHER): Payer: Medicaid Other | Admitting: Diagnostic Neuroimaging

## 2016-01-27 ENCOUNTER — Telehealth: Payer: Self-pay | Admitting: *Deleted

## 2016-01-27 ENCOUNTER — Encounter: Payer: Self-pay | Admitting: Diagnostic Neuroimaging

## 2016-01-27 VITALS — BP 106/71 | HR 64 | Wt 240.0 lb

## 2016-01-27 DIAGNOSIS — G500 Trigeminal neuralgia: Secondary | ICD-10-CM | POA: Diagnosis not present

## 2016-01-27 MED ORDER — CARBAMAZEPINE 200 MG PO TABS: 200.0000 mg | ORAL_TABLET | Freq: Two times a day (BID) | ORAL | 4 refills | Status: DC

## 2016-01-27 NOTE — Progress Notes (Signed)
[facility]  PATIENT: Carrie Douglas DOB: 1975-06-11  REFERRING CLINICIAN: ER  HISTORY FROM: patient  REASON FOR VISIT: follow up   HISTORICAL  CHIEF COMPLAINT:  Chief Complaint  Patient presents with  . Trigeminal neuralgia R side of face    rm 6, sig other- Aristeo, "I still get pain in my face but only once in a while; pain level much lower when I do have it"  . Follow-up    6 month    HISTORY OF PRESENT ILLNESS:   UPDATE 01/27/16: Since last visit, doing well. Pain levels much less. Tolerating CBZ. Only 1-2 facial pain spasms, and those were less intense than previously.   UPDATE 07/29/15: Since last visit right face pain improved. CBZ doing well. Only 2-3 spasms of pain in last 3 months.   PRIOR HPI (04/30/15): 40 year old female here for evaluation of right ear pain. At age 33 years old patient had onset of intermittent pain around her ear, sometimes stabbing and shocking, sometimes associated with right parietal pain, right eyelid drooping, photophobia and phonophobia. Symptoms can last seconds or up to 2 minutes at a time. In 2013 she was evaluated by neurology and treated with carbamazepine which improved symptoms. In 2014 she stopped carbamazepine due to resolution of symptoms. In 2017 symptoms have returned. Patient having at least 2 attacks per week. No specific triggering or aggravating factors.   REVIEW OF SYSTEMS: Full 14 system review of systems performed and negative except for: joint pain aching muscles insomnia.    ALLERGIES: Allergies  Allergen Reactions  . Codeine Nausea And Vomiting    Tylenol w/codeine makes her very sick.  . Other Itching    Burning Antibiotic for urinary tract infection:  Thinks cipro    HOME MEDICATIONS: Outpatient Medications Prior to Visit  Medication Sig Dispense Refill  . acetaminophen (TYLENOL) 500 MG tablet Take 1,000 mg by mouth every 6 (six) hours as needed for moderate pain.    . calcium-vitamin D  (OSCAL WITH D) 500-200 MG-UNIT per tablet Take 1 tablet by mouth daily with breakfast.    . carbamazepine (TEGRETOL) 200 MG tablet Take 1-2 tablets (200-400 mg total) by mouth 2 (two) times daily. 360 tablet 4  . diclofenac sodium (VOLTAREN) 1 % GEL Apply 2 g topically 4 (four) times daily.    . ferrous sulfate 325 (65 FE) MG tablet Take 1 tablet (325 mg total) by mouth 2 (two) times daily with a meal. 60 tablet 3  . HYDROcodone-acetaminophen (NORCO/VICODIN) 5-325 MG tablet Take 2 tablets by mouth every 4 (four) hours as needed. 15 tablet 0  . ibuprofen (ADVIL,MOTRIN) 200 MG tablet Take 600 mg by mouth every 6 (six) hours as needed for moderate pain.     . Multiple Vitamins-Minerals (MULTIVITAMIN ADULT PO) Take 1 tablet by mouth daily.    . ergocalciferol (VITAMIN D2) 50000 units capsule Take 1 capsule (50,000 Units total) by mouth once a week. (Patient not taking: Reported on 01/27/2016) 12 capsule 0  . fluconazole (DIFLUCAN) 150 MG tablet Take 1 tablet (150 mg total) by mouth once. 1 tablet 0  . gabapentin (NEURONTIN) 100 MG capsule Take 1 capsule (100 mg total) by mouth 3 (three) times daily as needed (pain). 30 capsule 0  . meclizine (ANTIVERT) 50 MG tablet Take 1 tablet (50 mg total) by mouth 3 (three) times daily as needed. 30 tablet 0  . ondansetron (ZOFRAN ODT) 8 MG disintegrating tablet Take 1 tablet (8 mg total) by mouth every  8 (eight) hours as needed for nausea or vomiting. 10 tablet 0  . ondansetron (ZOFRAN) 4 MG tablet Take 1 tablet (4 mg total) by mouth every 6 (six) hours. 20 tablet 0   No facility-administered medications prior to visit.     PAST MEDICAL HISTORY: Past Medical History:  Diagnosis Date  . Anemia    blood transfusion 09/14/10  . Anxiety   . Arthritis    "knees" (07/02/2014)  . Depression   . 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 Iron deficiency anemia   . Rubella    5 months old  . Sleep  apnea   . Vaginal delivery 1997, 1999, 2002, 2006, 2008, 2012  . Varicella    12 1/41 years old    PAST SURGICAL HISTORY: Past Surgical History:  Procedure Laterality Date  . ABDOMINAL HYSTERECTOMY    . CHOLECYSTECTOMY OPEN  2007  . DILATION AND CURETTAGE OF UTERUS  07/2013   "not a D&E"  . DILATION AND EVACUATION N/A 07/13/2013   Procedure: DILATATION AND EVACUATION;  Surgeon: Woodroe Mode, MD;  Location: [facility];  Service: Gynecology;  Laterality: N/A;  . LAPAROSCOPIC ASSISTED VAGINAL HYSTERECTOMY N/A 09/17/2014   Procedure: DIAGNOSTIC LAPAROSCOPIC, VAGINAL HYSTERECTOMY;  Surgeon: Osborne Oman, MD;  Location: [facility];  Service: Gynecology;  Laterality: N/A;  . LAPAROSCOPIC BILATERAL SALPINGECTOMY Bilateral 10/10/2013   Procedure: LAPAROSCOPIC BILATERAL SALPINGECTOMY;  Surgeon: Osborne Oman, MD;  Location: [facility];  Service: Gynecology;  Laterality: Bilateral;  with single site port  . ROUX-EN-Y GASTRIC BYPASS  2004  . SALPINGOOPHORECTOMY Bilateral 08/2013    FAMILY HISTORY: Family History  Problem Relation Age of Onset  . Lung cancer Father   . Hypertension Father   . Heart disease Maternal Grandmother   . Heart disease Maternal Grandfather   . Heart disease Paternal Grandmother   . Heart disease Paternal Grandfather     SOCIAL HISTORY:  Social History   Social History  . Marital status: Married    Spouse name: Aristeo  . Number of children: 5  . Years of education: 14   Occupational History  .      self employed Optometrist   Social History Main Topics  . Smoking status: Current Every Day Smoker    Packs/day: 0.25    Years: 5.00    Types: Cigarettes  . Smokeless tobacco: Never Used     Comment: 04/30/15 10 cigs daily  . Alcohol use No  . Drug use: No  . Sexual activity: Not Currently    Birth control/ protection: Condom, Surgical     Comment: IUD removed in 2010, period method   Other Topics Concern  . Not on file   Social History Narrative   Husband and  5 children at home   Caffeine use- coffee 2 cups daily, diet pepsi     PHYSICAL EXAM  GENERAL EXAM/CONSTITUTIONAL: Vitals:  Vitals:   01/27/16 1041  BP: 106/71  Pulse: 64  Weight: 240 lb (108.9 kg)   Body mass index is 38.74 kg/m. No exam data present  Patient is in no distress; well developed, nourished and groomed; neck is supple  CARDIOVASCULAR:  Examination of carotid arteries is normal; no carotid bruits  Regular rate and rhythm, no murmurs  Examination of peripheral vascular system by observation and palpation is normal  EYES:  Ophthalmoscopic exam of optic discs and posterior segments is normal; no  papilledema or hemorrhages  MUSCULOSKELETAL:  Gait, strength, tone, movements noted in Neurologic exam below  NEUROLOGIC: MENTAL STATUS:  No flowsheet data found.  awake, alert, oriented to person, place and time  recent and remote memory intact  normal attention and concentration  language fluent, comprehension intact, naming intact,   fund of knowledge appropriate  CRANIAL NERVE:   2nd - no papilledema on fundoscopic exam  2nd, 3rd, 4th, 6th - pupils equal and reactive to light, visual fields full to confrontation, extraocular muscles intact, no nystagmus  5th - facial sensation symmetric  7th - facial strength symmetric  8th - hearing intact  9th - palate elevates symmetrically, uvula midline  11th - shoulder shrug symmetric  12th - tongue protrusion midline  MOTOR:   normal bulk and tone, full strength in the BUE, BLE  SENSORY:   normal and symmetric to light touch, pinprick, temperature, vibration    COORDINATION:   finger-nose-finger, fine finger movements normal  REFLEXES:   deep tendon reflexes present and symmetric  GAIT/STATION:   narrow based gait; SLIGHTLY ANTALGIC    DIAGNOSTIC DATA (LABS, IMAGING, TESTING) - I reviewed patient records, labs, notes, testing and imaging myself where available.  Lab Results    Component Value Date   WBC 6.2 10/28/2015   HGB 13.9 10/28/2015   HCT 43.7 10/28/2015   MCV 78.6 (L) 10/28/2015   PLT 196 10/28/2015      Component Value Date/Time   NA 137 09/10/2015 1317   NA 141 07/29/2015 1344   K 4.1 09/10/2015 1317   CL 109 09/10/2015 1317   CO2 22 09/10/2015 1317   GLUCOSE 132 (H) 09/10/2015 1317   BUN 10 09/10/2015 1317   BUN 11 07/29/2015 1344   CREATININE 0.65 09/10/2015 1317   CALCIUM 8.9 09/10/2015 1317   PROT 6.6 09/10/2015 1317   PROT 6.8 07/29/2015 1344   ALBUMIN 3.8 09/10/2015 1317   ALBUMIN 4.3 07/29/2015 1344   AST 15 09/10/2015 1317   ALT 9 (L) 09/10/2015 1317   ALKPHOS 68 09/10/2015 1317   BILITOT 0.1 (L) 09/10/2015 1317   BILITOT <0.2 07/29/2015 1344   GFRNONAA >60 09/10/2015 1317   GFRAA >60 09/10/2015 1317   No results found for: CHOL, HDL, LDLCALC, LDLDIRECT, TRIG, CHOLHDL No results found for: HGBA1C Lab Results  Component Value Date   VITAMINB12 177 (L) 09/16/2015   Lab Results  Component Value Date   TSH 1.224 08/07/2014    08/18/11 MRI brain - normal  08/18/11 MRA head - normal    ASSESSMENT AND PLAN  40 y.o. year old female here with intermittent right ear and head/scalp pain shock/stab symptoms since age 52 years old. Doing well on carbamazepine.   Dx: atypical trigeminal neuralgia   PLAN: I spent 15 minutes of face to face time with patient. Greater than 50% of time was spent in counseling and coordination of care with patient. In summary we discussed:  - continue carbamazepine 200mg  BID (may increase to 400mg  BID if needed) - CBC, CMP every 6-12 months per PCP  Meds ordered this encounter  Medications  . carbamazepine (TEGRETOL) 200 MG tablet    Sig: Take 1-2 tablets (200-400 mg total) by mouth 2 (two) times daily.    Dispense:  360 tablet    Refill:  4   Return in about 6 months (around 07/27/2016).     Penni Bombard, MD [id number], [id number] AM Certified in Neurology, Neurophysiology and  Neuroimaging  [facility] [phone]  [address] [place], [place] [address] [phone]

## 2016-01-27 NOTE — Patient Instructions (Signed)
-   continue carbamazepine 

## 2016-01-27 NOTE — Telephone Encounter (Signed)
Called  [facility], spoke with Cristie Hem, and PA started for carbamazepine. PA [id number] . Alex stated she sent request to pharmacy for review, will need to call back after 24 hours for decision. Alex stated if med is not approved, pharmacy can submit request for brand name Tegretol, and it will be approved.

## 2016-01-28 NOTE — Telephone Encounter (Signed)
Called [facility] to check status of PA for carbamazepine. Spoke with Joellen Jersey who stated medication was approved from 01/27/16 through 01/21/17.  PA # [id number] [facility] and spoke with Ronalee Belts; advised him Medicaid approved medication. He verbalized understanding, appreciation.

## 2016-02-10 ENCOUNTER — Other Ambulatory Visit (HOSPITAL_BASED_OUTPATIENT_CLINIC_OR_DEPARTMENT_OTHER): Payer: Medicaid Other

## 2016-02-10 DIAGNOSIS — Z9884 Bariatric surgery status: Secondary | ICD-10-CM

## 2016-02-10 DIAGNOSIS — D509 Iron deficiency anemia, unspecified: Secondary | ICD-10-CM

## 2016-02-10 DIAGNOSIS — K909 Intestinal malabsorption, unspecified: Secondary | ICD-10-CM | POA: Diagnosis not present

## 2016-02-10 DIAGNOSIS — E559 Vitamin D deficiency, unspecified: Secondary | ICD-10-CM

## 2016-02-10 DIAGNOSIS — E538 Deficiency of other specified B group vitamins: Secondary | ICD-10-CM

## 2016-02-10 LAB — CBC & DIFF AND RETIC
BASO%: 0.1 % (ref 0.0–2.0)
BASOS ABS: 0 10*3/uL (ref 0.0–0.1)
EOS%: 1.3 % (ref 0.0–7.0)
Eosinophils Absolute: 0.1 10*3/uL (ref 0.0–0.5)
HEMATOCRIT: 42.9 % (ref 34.8–46.6)
HGB: 14.4 g/dL (ref 11.6–15.9)
Immature Retic Fract: 3.1 % (ref 1.60–10.00)
LYMPH%: 29.5 % (ref 14.0–49.7)
MCH: 30.5 pg (ref 25.1–34.0)
MCHC: 33.6 g/dL (ref 31.5–36.0)
MCV: 90.9 fL (ref 79.5–101.0)
MONO#: 0.4 10*3/uL (ref 0.1–0.9)
MONO%: 6.1 % (ref 0.0–14.0)
NEUT#: 4.2 10*3/uL (ref 1.5–6.5)
NEUT%: 63 % (ref 38.4–76.8)
PLATELETS: 217 10*3/uL (ref 145–400)
RBC: 4.72 10*6/uL (ref 3.70–5.45)
RDW: 14 % (ref 11.2–14.5)
RETIC %: 1.11 % (ref 0.70–2.10)
Retic Ct Abs: 52.39 10*3/uL (ref 33.70–90.70)
WBC: 6.7 10*3/uL (ref 3.9–10.3)
lymph#: 2 10*3/uL (ref 0.9–3.3)

## 2016-02-10 LAB — FERRITIN: Ferritin: 39 ng/ml (ref 9–269)

## 2016-02-11 LAB — VITAMIN D 25 HYDROXY (VIT D DEFICIENCY, FRACTURES): Vitamin D, 25-Hydroxy: 15 ng/mL — ABNORMAL LOW (ref 30.0–100.0)

## 2016-02-11 LAB — VITAMIN B12: Vitamin B12: 176 pg/mL — ABNORMAL LOW (ref 211–946)

## 2016-02-17 ENCOUNTER — Telehealth: Payer: Self-pay | Admitting: Hematology and Oncology

## 2016-02-17 ENCOUNTER — Telehealth: Payer: Self-pay | Admitting: *Deleted

## 2016-02-17 ENCOUNTER — Encounter: Payer: Self-pay | Admitting: Hematology and Oncology

## 2016-02-17 ENCOUNTER — Ambulatory Visit (HOSPITAL_BASED_OUTPATIENT_CLINIC_OR_DEPARTMENT_OTHER): Payer: Medicaid Other | Admitting: Hematology and Oncology

## 2016-02-17 ENCOUNTER — Ambulatory Visit (HOSPITAL_BASED_OUTPATIENT_CLINIC_OR_DEPARTMENT_OTHER): Payer: Medicaid Other

## 2016-02-17 ENCOUNTER — Ambulatory Visit: Payer: Medicaid Other

## 2016-02-17 VITALS — BP 120/68 | HR 78 | Temp 97.5°F | Resp 18 | Ht 66.0 in | Wt 239.9 lb

## 2016-02-17 DIAGNOSIS — E538 Deficiency of other specified B group vitamins: Secondary | ICD-10-CM | POA: Diagnosis present

## 2016-02-17 DIAGNOSIS — Z9884 Bariatric surgery status: Secondary | ICD-10-CM

## 2016-02-17 DIAGNOSIS — E559 Vitamin D deficiency, unspecified: Secondary | ICD-10-CM

## 2016-02-17 DIAGNOSIS — D509 Iron deficiency anemia, unspecified: Secondary | ICD-10-CM

## 2016-02-17 MED ORDER — ERGOCALCIFEROL 1.25 MG (50000 UT) PO CAPS: 50000.0000 [IU] | ORAL_CAPSULE | ORAL | 1 refills | Status: DC

## 2016-02-17 MED ORDER — CYANOCOBALAMIN 1000 MCG/ML IJ SOLN: 1000.0000 ug | Freq: Once | INTRAMUSCULAR | 16 refills | Status: AC

## 2016-02-17 MED ORDER — CYANOCOBALAMIN 1000 MCG/ML IJ SOLN
1000.0000 ug | Freq: Once | INTRAMUSCULAR | Status: AC
  Administered 2016-02-17: 1000 ug via INTRAMUSCULAR

## 2016-02-17 NOTE — Assessment & Plan Note (Signed)
Her iron deficiency anemia has resolved. She does not need IV iron today I will recheck her blood work again in 3 months

## 2016-02-17 NOTE — Assessment & Plan Note (Signed)
Her previous B-12 level was low and with her history of gastric bypass surgery, she will need B-12 injection otherwise she would not have effective erythropoiesis I recommend vitamin B 12 injection weekly x 1 month and then once a month I will get my injection nurse teach her how to self administer vitamin B-12 and I plan to recheck her blood work again in 3 months

## 2016-02-17 NOTE — Telephone Encounter (Signed)
Today's Infusion was cancelled per Dr Alvy Bimler, per 02/17/16 los. Message was sent to Infusion scheduler to add Infusion for 06/09/16, per 02/17/16 los. Appointments scheduled per 02/17/16 los. AVS report and appointment schedule given to patient, per 02/17/16 los.

## 2016-02-17 NOTE — Assessment & Plan Note (Signed)
She is likely to have vitamin D deficiency due to gastric bypass surgery. She will continue vitamin D replacement therapy

## 2016-02-17 NOTE — Patient Instructions (Addendum)
Cyanocobalamin, Vitamin B12 injection What is this medicine? CYANOCOBALAMIN (sye an oh koe BAL a min) is a man made form of vitamin B12. Vitamin B12 is used in the growth of healthy blood cells, nerve cells, and proteins in the body. It also helps with the metabolism of fats and carbohydrates. This medicine is used to treat people who can not absorb vitamin B12. This medicine may be used for other purposes; ask your health care provider or pharmacist if you have questions. What should I tell my health care provider before I take this medicine? They need to know if you have any of these conditions: -kidney disease -Leber's disease -megaloblastic anemia -an unusual or allergic reaction to cyanocobalamin, cobalt, other medicines, foods, dyes, or preservatives -pregnant or trying to get pregnant -breast-feeding How should I use this medicine? This medicine is injected into a muscle or deeply under the skin. It is usually given by a health care professional in a clinic or doctor's office. However, your doctor may teach you how to inject yourself. Follow all instructions. Talk to your pediatrician regarding the use of this medicine in children. Special care may be needed. Overdosage: If you think you have taken too much of this medicine contact a poison control center or emergency room at once. NOTE: This medicine is only for you. Do not share this medicine with others. What if I miss a dose? If you are given your dose at a clinic or doctor's office, call to reschedule your appointment. If you give your own injections and you miss a dose, take it as soon as you can. If it is almost time for your next dose, take only that dose. Do not take double or extra doses. What may interact with this medicine? -colchicine -heavy alcohol intake This list may not describe all possible interactions. Give your health care provider a list of all the medicines, herbs, non-prescription drugs, or dietary supplements you  use. Also tell them if you smoke, drink alcohol, or use illegal drugs. Some items may interact with your medicine. What should I watch for while using this medicine? Visit your doctor or health care professional regularly. You may need blood work done while you are taking this medicine. You may need to follow a special diet. Talk to your doctor. Limit your alcohol intake and avoid smoking to get the best benefit. What side effects may I notice from receiving this medicine? Side effects that you should report to your doctor or health care professional as soon as possible: -allergic reactions like skin rash, itching or hives, swelling of the face, lips, or tongue -blue tint to skin -chest tightness, pain -difficulty breathing, wheezing -dizziness -red, swollen painful area on the leg Side effects that usually do not require medical attention (report to your doctor or health care professional if they continue or are bothersome): -diarrhea -headache This list may not describe all possible side effects. Call your doctor for medical advice about side effects. You may report side effects to FDA at 1-800-FDA-1088. Where should I keep my medicine? Keep out of the reach of children. Store at room temperature between 15 and 30 degrees C (59 and 85 degrees F). Protect from light. Throw away any unused medicine after the expiration date. NOTE: This sheet is a summary. It may not cover all possible information. If you have questions about this medicine, talk to your doctor, pharmacist, or health care provider.    2016, Elsevier/Gold Standard. (2007-07-10 22:10:20)  Intramuscular Injections, How and Where  to Give An intramuscular (IM) injection is a shot of medicine given into a muscle. Medicines that must be given into a muscle include:  Medicines that need to take effect quickly.  Medicines that cannot be taken in other ways, such as swallowed by mouth, injected into a blood vessel, or absorbed  through the skin. CHOOSING A SITE FOR INJECTION There are 4 main sites that can be used for IM injections. If many injections need to be given, injections should be given in different sites (rotated) each time. An injection should be separated from the previous site by 1 inch (2.5 cm). The ideal site depends on your age, size, and amount of medicine in the injection.  Thigh (vastus lateralis muscle). This site is located by dividing the thigh into thirds between the knee and hip. The site is located in the middle, outer sides of the thighs. This site is usually used for infants and toddlers. The maximum amount of medicine that should be injected in this muscle is 4 mL.  Top of upper arm (deltoid muscle). One method to locate this site is to place the palm of a hand centered on the person's shoulder. Your fingers should be pointing toward the floor. The thumb and the other fingers are separated to make an upside down V shape. The IM injection should go in the middle of the V shape. This site is usually used for children age 73 and older and adults. This site can be used in children under 11 years of age if the muscle mass is adequate. The maximum amount of medicine that should be injected in this muscle is 1 mL.  Hip (ventrogluteal or gluteus medius muscle).One method to locate this site is to place the heel of your hand on the person's hip, on the upper side of his or her leg. Your hand should be placed so your fingers are facing his or her head. You should feel the upper edge of the bony pelvis with your ring finger and little finger. Point your thumb to his or her groin. Spread your index finger and middle finger into a V shape and give the injection between those fingers. This site can be used for children age 81 months and older and adults. There is no maximum amount of medicine for this injection site.  Buttocks (dorsogluteal muscle). One method to locate this site is to divide one buttock into 4  quarters. The quarters are formed by dividing the buttock in half from top to bottom and also in half from side to side. The injection should be given in the upper, outer quarter. This site can be used for children and adults. The maximum amount of medicine that should be injected in this muscle is 4 mL. This site does have a high risk of hitting a nerve, blood vessel, or bone. CHOOSING A NEEDLE FOR INJECTION A needle used for an IM injection needs to be long enough to go through the skin and tissue and end deep in the muscle. Needles have two measurements: a needle diameter (called a gauge) and a needle length (denoted in inches or mm). The bigger the gauge, the smaller the needle diameter. The smaller the gauge, the bigger the needle diameter. The needle gauge for an IM injection should be 22 to 25 gauge. Needle length depends upon age and size of the individual and the injection muscle. Age: Newborn (Birth to 1 month)  Weight: Any weight  Injection Site: Thigh  Needle Length:  inch (84mm) Age: Infant (1 month to 1 year)  Weight:  Any weight  Injection Site: Thigh  Needle Length: 1 inch (36mm) Age: Child (1 to 2 years)  Weight: Any weight  Injection Site: Thigh  Needle Length: 1 to 1  inch (25 to 31 mm) Age: Child (1 to 18 years)  Weight: Any weight  Injection Site: Deltoid  Needle Length:  to 1 inch (16 to 25 mm) Age: Child (2 to 18 years)  Weight: Any weight  Injection Site: Any site, except deltoid  Needle Length: 1 to 1  inch (25 to 31 mm) Age: Adult  Weight: Less than 130 lb (less than 60 kg)  Injection Site: Any site  Needle Length:  inch (16 mm) Age: Adult  Weight: 130 to 152 lb (60 to 70 kg)  Injection Site: Any site  Needle Length:  1 inch (25 mm) Age: Adult  Weight: Women: 152 to 200 lb (70 to 90 kg), Men: 152 to 260 lb (70 to 118 kg)  Injection Site: Any site  Needle Length: 1 to 1  inch (25 to 38.1 mm) Age: Adult  Weight: Women: greater  than 200 lb (greater than 90 kg), Men: greater than 260 lb (greater than 118 kg)  Injection Site: Any site  Needle Length: 1  inch (38.1 mm) ADMINISTERING AN IM INJECTION USING MEDICINE FROM A VIAL 1. Gather supplies: medicine vial, alcohol wipe, syringe with the appropriate gauge and inch needle. 2. Wash your hands. 3. Check the date on the medicine vial to make sure the medicine is not expired. 4. Clean the top of vial with an alcohol wipe. Be sure that the cover has been removed on new vials. 5. Let the top of the vial dry. 6. Carefully remove the needle cover. Do not touch the needle. 7. Pull air into the syringe equal to the amount of the medicine dose you need. 8. Stick the syringe needle into the top of the medicine vial. 9. Inject the air into the vial. Leave the vial upright. 10. Leave the needle in the vial and turn the vial and syringe upside down. 11. Pull down slowly on the plunger, drawing the medicine into the syringe. You may need to push the plunger up and down 2 to 3 times to slowly rid the syringe of air bubbles. 12. Pull the medicine into the syringe to the dose you need. 13. Remove the needle and syringe from the vial. 14. Use alcohol to clean the area of the body to be injected. 15. Hold the muscle of the injection site firmly between your thumb and fingers of one hand. 16. With the other hand, hold the needle and syringe like a pencil. Using a quick dart-like motion, insert the needle at a 90-degree angle through the skin and into the muscle. 17. Release your hold on the skin and muscle. 18. Pull back slightly on the plunger to see if blood is present. If there is blood, remove the needle and syringe and start over with a new needle and syringe. If there is no blood, inject the medicine. 19. Push the plunger slowly down to inject the medicine into the muscle. Some medicines are gels and require force to inject into the muscle. 20. Take the needle out. 21. Apply  pressure at the injection site and gently rub the site. 22. Apply a bandage if needed. 23. Discard the used needle and syringe properly. 24. Store the medicine vial as directed. SEEK MEDICAL CARE IF:  The patient feels like he or she is having side effects or an allergic reaction from the medicine.  The medicine was injected incorrectly.  You or someone else is unable to give the injection.  The patient has a rash, swelling, or continued bleeding or pain at an injection site.   This information is not intended to replace advice given to you by your health care provider. Make sure you discuss any questions you have with your health care provider.   Document Released: 07/14/2010 Document Revised: 04/19/2014 Document Reviewed: 10/11/2014 Elsevier Interactive Patient Education [employer].

## 2016-02-17 NOTE — Telephone Encounter (Signed)
Per LOS I have scheduled appts and notified the scheduler 

## 2016-02-17 NOTE — Progress Notes (Signed)
[facility] OFFICE PROGRESS NOTE  Philis Fendt, MD SUMMARY OF HEMATOLOGIC HISTORY:  Carrie Douglas 40 y.o. female is here because of severe iron deficiency anemia. 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 She had gastric bypass surgery after the birth of her daughter. She has 6 children in total. The range from 60-year-old to 41 year old. 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 She has received numerous blood transfusions over the past 7 years and also intermittent IV iron replacement therapy. She moved from [place] to [place] 5 years ago and has no consistent follow-up with hematologist.  She was found to have abnormal CBC from recent blood work. CBC dated back the past few years range from as low as 6.2 to as high as 10.8 She went to the emergency department recently complaining of dizziness. She complained of chest discomfort on exertion, shortness of breath on minimal exertion, pre-syncopal episodes, and palpitations. She had not noticed any recent bleeding such as epistaxis, hematuria or hematochezia The patient denies regular over the counter NSAID ingestion. She takes ibuprofen once or twice a month only.She is not on antiplatelets agents.  She had no prior history or diagnosis of cancer. Her age appropriate screening programs are up-to-date. She has significant pica with excessive chewing of ice but eats a variety of diet. She never donated blood The patient was prescribed oral iron supplements and she takes consistently 3 times a day with food. The iron pill causes significant constipation. She received multiple mineral replacement therapies in June 2017 INTERVAL HISTORY: Carrie Douglas 40 y.o. female returns for follow-up. She has excellent energy level. The patient denies any recent signs or symptoms of bleeding such as spontaneous epistaxis, hematuria or hematochezia. She ran  out of her prescription vitamin D recently  I have reviewed the past medical history, past surgical history, social history and family history with the patient and they are unchanged from previous note.  ALLERGIES:  is allergic to codeine and other.  MEDICATIONS:  Current Outpatient Prescriptions  Medication Sig Dispense Refill  . acetaminophen (TYLENOL) 500 MG tablet Take 1,000 mg by mouth every 6 (six) hours as needed for moderate pain.    . calcium-vitamin D (OSCAL WITH D) 500-200 MG-UNIT per tablet Take 1 tablet by mouth daily with breakfast.    . carbamazepine (TEGRETOL) 200 MG tablet Take 1-2 tablets (200-400 mg total) by mouth 2 (two) times daily. 360 tablet 4  . diclofenac sodium (VOLTAREN) 1 % GEL Apply 2 g topically 4 (four) times daily.    Derrill Memo ON 02/19/2016] ergocalciferol (VITAMIN D2) 50000 units capsule Take 1 capsule (50,000 Units total) by mouth 2 (two) times a week. 24 capsule 1  . ferrous sulfate 325 (65 FE) MG tablet Take 1 tablet (325 mg total) by mouth 2 (two) times daily with a meal. 60 tablet 3  . ibuprofen (ADVIL,MOTRIN) 200 MG tablet Take 600 mg by mouth every 6 (six) hours as needed for moderate pain.     . Multiple Vitamins-Minerals (MULTIVITAMIN ADULT PO) Take 1 tablet by mouth daily.    . phentermine (ADIPEX-P) 37.5 MG tablet Take 37.5 mg by mouth every morning.  0  . cyanocobalamin (,VITAMIN B-12,) 1000 MCG/ML injection Inject 1 mL (1,000 mcg total) into the muscle once. Weekly injection x 4, then monthly 1 mL 16  . HYDROcodone-acetaminophen (NORCO/VICODIN) 5-325 MG tablet Take 2 tablets by mouth every 4 (four) hours as needed. (Patient  not taking: Reported on 02/17/2016) 15 tablet 0   No current facility-administered medications for this visit.      REVIEW OF SYSTEMS:   Constitutional: Denies fevers, chills or night sweats Eyes: Denies blurriness of vision Ears, nose, mouth, throat, and face: Denies mucositis or sore throat Respiratory: Denies cough,  dyspnea or wheezes Cardiovascular: Denies palpitation, chest discomfort or lower extremity swelling Gastrointestinal:  Denies nausea, heartburn or change in bowel habits Skin: Denies abnormal skin rashes Lymphatics: Denies new lymphadenopathy or easy bruising Neurological:Denies numbness, tingling or new weaknesses Behavioral/Psych: Mood is stable, no new changes  All other systems were reviewed with the patient and are negative.  PHYSICAL EXAMINATION: ECOG PERFORMANCE STATUS: 0 - Asymptomatic  Vitals:   02/17/16 1041  BP: 120/68  Pulse: 78  Resp: 18  Temp: 97.5 F (36.4 C)   Filed Weights   02/17/16 1041  Weight: 239 lb 14.4 oz (108.8 kg)    GENERAL:alert, no distress and comfortable SKIN: skin color, texture, turgor are normal, no rashes or significant lesions EYES: normal, Conjunctiva are pink and non-injected, sclera clear Musculoskeletal:no cyanosis of digits and no clubbing  NEURO: alert & oriented x 3 with fluent speech, no focal motor/sensory deficits  LABORATORY DATA:  I have reviewed the data as listed     Component Value Date/Time   NA 137 09/10/2015 1317   NA 141 07/29/2015 1344   K 4.1 09/10/2015 1317   CL 109 09/10/2015 1317   CO2 22 09/10/2015 1317   GLUCOSE 132 (H) 09/10/2015 1317   BUN 10 09/10/2015 1317   BUN 11 07/29/2015 1344   CREATININE 0.65 09/10/2015 1317   CALCIUM 8.9 09/10/2015 1317   PROT 6.6 09/10/2015 1317   PROT 6.8 07/29/2015 1344   ALBUMIN 3.8 09/10/2015 1317   ALBUMIN 4.3 07/29/2015 1344   AST 15 09/10/2015 1317   ALT 9 (L) 09/10/2015 1317   ALKPHOS 68 09/10/2015 1317   BILITOT 0.1 (L) 09/10/2015 1317   BILITOT <0.2 07/29/2015 1344   GFRNONAA >60 09/10/2015 1317   GFRAA >60 09/10/2015 1317    No results found for: SPEP, UPEP  Lab Results  Component Value Date   WBC 6.7 02/10/2016   NEUTROABS 4.2 02/10/2016   HGB 14.4 02/10/2016   HCT 42.9 02/10/2016   MCV 90.9 02/10/2016   PLT 217 02/10/2016      Chemistry       Component Value Date/Time   NA 137 09/10/2015 1317   NA 141 07/29/2015 1344   K 4.1 09/10/2015 1317   CL 109 09/10/2015 1317   CO2 22 09/10/2015 1317   BUN 10 09/10/2015 1317   BUN 11 07/29/2015 1344   CREATININE 0.65 09/10/2015 1317      Component Value Date/Time   CALCIUM 8.9 09/10/2015 1317   ALKPHOS 68 09/10/2015 1317   AST 15 09/10/2015 1317   ALT 9 (L) 09/10/2015 1317   BILITOT 0.1 (L) 09/10/2015 1317   BILITOT <0.2 07/29/2015 1344     ASSESSMENT & PLAN:  Iron deficiency anemia Her iron deficiency anemia has resolved. She does not need IV iron today I will recheck her blood work again in 3 months  Vitamin B12 deficiency Her previous B-12 level was low and with her history of gastric bypass surgery, she will need B-12 injection otherwise she would not have effective erythropoiesis I recommend vitamin B 12 injection weekly x 1 month and then once a month I will get my injection nurse teach her how to  self administer vitamin B-12 and I plan to recheck her blood work again in 3 months  Vitamin D deficiency She is likely to have vitamin D deficiency due to gastric bypass surgery. She will continue vitamin D replacement therapy   Orders Placed This Encounter  Procedures  . CBC & Diff and Retic    Standing Status:   Future    Standing Expiration Date:   03/23/2017  . Ferritin    Standing Status:   Future    Standing Expiration Date:   02/16/2017  . Vitamin B12    Standing Status:   Future    Standing Expiration Date:   03/23/2017  . Vitamin D 25 hydroxy    Standing Status:   Future    Standing Expiration Date:   02/16/2017    All questions were answered. The patient knows to call the clinic with any problems, questions or concerns. No barriers to learning was detected.  I spent 15 minutes counseling the patient face to face. The total time spent in the appointment was 20 minutes and more than 50% was on counseling.     Heath Lark, MD 11/7/20171:22 PM

## 2016-05-03 ENCOUNTER — Encounter: Payer: Medicaid Other | Attending: Internal Medicine | Admitting: Registered"

## 2016-05-03 DIAGNOSIS — E6609 Other obesity due to excess calories: Secondary | ICD-10-CM

## 2016-05-03 DIAGNOSIS — Z6836 Body mass index (BMI) 36.0-36.9, adult: Secondary | ICD-10-CM

## 2016-05-03 DIAGNOSIS — Z713 Dietary counseling and surveillance: Secondary | ICD-10-CM | POA: Insufficient documentation

## 2016-05-03 DIAGNOSIS — E669 Obesity, unspecified: Secondary | ICD-10-CM | POA: Diagnosis not present

## 2016-05-03 NOTE — Patient Instructions (Addendum)
Consider reading food labels for Total Carbohydrate and Unsaturated Fat of foods Consider sitting down with children at breakfast Continue your activity routine daily as tolerated Fats to include in some olive oil, unsaturated. Coconut oil is okay in moderation.  Example of good yogurt choice: Light & Fit [place] Blends (danon) Oatmeal instant with antioxidant   Resources: Website to search food nutrition facts: https://www.supertracker.[employer].dk.aspx Alternative seasonings for salt handout

## 2016-05-03 NOTE — Progress Notes (Signed)
Medical Nutrition Therapy:  Appt start time: 0945 end time:  1045.   Assessment:  Primary concerns today: Wants to make sure she is making good choices for carbs and fats. Also wants to watch salt intake. 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 She has 4 children at home and motivated to model healthy eating habits.    Wt Readings from Last 3 Encounters:  05/03/16 225 lb 6.4 oz (102.2 kg)  02/17/16 239 lb 14.4 oz (108.8 kg)  01/27/16 240 lb (108.9 kg)   Preferred Learning Style:   No preference indicated   Learning Readiness:   Change in progress   MEDICATIONS: reviewed   DIETARY INTAKE:  Usual eating pattern includes 2 meals and 1 snack per day. States she is not hungry in the morning and fixes breakfast for her kids but will keep busy cleaning house in the morning. Gets very hungry around 10 am but doesn't stop until lunch time.  Tries to eat a low carb/high protein diet. Instead of breakfast she reports making a protein drink with PB or banana and will sip on it until lunch. Currently adding coconut oil to foods.   24-hr recall:  B ( AM): none OR 1 hard-boiled egg Snk ( AM): sips on protein drink  L ( PM): chicken salad with vegetables grilled Snk ( PM): none D (5 PM): meat OR 1 cup beans, 1/2 baked potato, vegetables Snk ( PM): Nothing  Beverages: water, (proud of herself) lowered intake of diet pepsi from 2 liters to 4-6 oz with dinner.  Usual physical activity: house cleaning, has treadmill and stationery bike. Was doing 45 min moderate activity 2 times per day (until last 2 weeks). Plans to get back into routine.  Estimated energy needs: 1600 calories 180 g carbohydrates 120 g protein 44 g fat  Progress Towards Goal(s):  In progress.   Nutritional Diagnosis:  [place]-3.3 Overweight/obesity As related to history of excess caloric intake.  As evidenced by BMI of 36, patient weight loss goal.    Intervention:   Nutrition Education. Reviewed difference between saturated and unsaturated fat. Explained why patient needs to have adequate calories and maintain muscle mass. Meal replacement may be okay for breakfast, but better choice would be to sit down with children for breakfast.  Plan: Consider reading food labels for Total Carbohydrate and Unsaturated Fat of foods Consider sitting down with children at breakfast Continue your activity routine daily as tolerated Fats to include: olive oil, unsaturated. Coconut oil is okay in moderation.  Example of good yogurt choice: Light & Fit [place] Blends (danon) Oatmeal instant with antioxidant   Resources: Website to search food nutrition facts: https://www.supertracker.[employer].dk.aspx Alternative seasonings for salt handout  Teaching Method Utilized:  Visual Auditory  Handouts given during visit include:  My Plate  Salt alternatives  Barriers to learning/adherence to lifestyle change: none  Demonstrated degree of understanding via:  Teach Back   Monitoring/Evaluation:  Dietary intake, exercise, and body weight in 6 week(s).

## 2016-05-13 ENCOUNTER — Other Ambulatory Visit (HOSPITAL_BASED_OUTPATIENT_CLINIC_OR_DEPARTMENT_OTHER): Payer: Medicaid Other

## 2016-05-13 DIAGNOSIS — D509 Iron deficiency anemia, unspecified: Secondary | ICD-10-CM

## 2016-05-13 DIAGNOSIS — E559 Vitamin D deficiency, unspecified: Secondary | ICD-10-CM

## 2016-05-13 DIAGNOSIS — E538 Deficiency of other specified B group vitamins: Secondary | ICD-10-CM

## 2016-05-13 LAB — CBC & DIFF AND RETIC
BASO%: 0.3 % (ref 0.0–2.0)
Basophils Absolute: 0 10*3/uL (ref 0.0–0.1)
EOS%: 1.6 % (ref 0.0–7.0)
Eosinophils Absolute: 0.1 10*3/uL (ref 0.0–0.5)
HCT: 43.2 % (ref 34.8–46.6)
HGB: 15.2 g/dL (ref 11.6–15.9)
IMMATURE RETIC FRACT: 3.9 % (ref 1.60–10.00)
LYMPH%: 33.5 % (ref 14.0–49.7)
MCH: 30.9 pg (ref 25.1–34.0)
MCHC: 35.2 g/dL (ref 31.5–36.0)
MCV: 87.8 fL (ref 79.5–101.0)
MONO#: 0.4 10*3/uL (ref 0.1–0.9)
MONO%: 6 % (ref 0.0–14.0)
NEUT%: 58.6 % (ref 38.4–76.8)
NEUTROS ABS: 3.7 10*3/uL (ref 1.5–6.5)
Platelets: 207 10*3/uL (ref 145–400)
RBC: 4.92 10*6/uL (ref 3.70–5.45)
RDW: 13 % (ref 11.2–14.5)
RETIC %: 0.8 % (ref 0.70–2.10)
Retic Ct Abs: 39.36 10*3/uL (ref 33.70–90.70)
WBC: 6.3 10*3/uL (ref 3.9–10.3)
lymph#: 2.1 10*3/uL (ref 0.9–3.3)

## 2016-05-13 LAB — FERRITIN: Ferritin: 32 ng/ml (ref 9–269)

## 2016-05-14 ENCOUNTER — Other Ambulatory Visit: Payer: Self-pay | Admitting: Hematology and Oncology

## 2016-05-14 LAB — VITAMIN D 25 HYDROXY (VIT D DEFICIENCY, FRACTURES): Vitamin D, 25-Hydroxy: 19.2 ng/mL — ABNORMAL LOW (ref 30.0–100.0)

## 2016-05-14 LAB — VITAMIN B12: Vitamin B12: 250 pg/mL (ref 232–1245)

## 2016-05-17 ENCOUNTER — Other Ambulatory Visit: Payer: Self-pay | Admitting: Obstetrics & Gynecology

## 2016-05-17 DIAGNOSIS — Z1231 Encounter for screening mammogram for malignant neoplasm of breast: Secondary | ICD-10-CM

## 2016-05-20 ENCOUNTER — Encounter: Payer: Self-pay | Admitting: Hematology and Oncology

## 2016-05-20 ENCOUNTER — Telehealth: Payer: Self-pay | Admitting: Hematology and Oncology

## 2016-05-20 ENCOUNTER — Ambulatory Visit (HOSPITAL_BASED_OUTPATIENT_CLINIC_OR_DEPARTMENT_OTHER): Payer: Medicaid Other

## 2016-05-20 ENCOUNTER — Telehealth: Payer: Self-pay | Admitting: *Deleted

## 2016-05-20 ENCOUNTER — Ambulatory Visit (HOSPITAL_BASED_OUTPATIENT_CLINIC_OR_DEPARTMENT_OTHER): Payer: Medicaid Other | Admitting: Hematology and Oncology

## 2016-05-20 VITALS — BP 118/68 | HR 69 | Temp 98.3°F | Resp 18 | Ht 66.0 in | Wt 228.1 lb

## 2016-05-20 DIAGNOSIS — E538 Deficiency of other specified B group vitamins: Secondary | ICD-10-CM

## 2016-05-20 DIAGNOSIS — E559 Vitamin D deficiency, unspecified: Secondary | ICD-10-CM | POA: Diagnosis not present

## 2016-05-20 DIAGNOSIS — D509 Iron deficiency anemia, unspecified: Secondary | ICD-10-CM

## 2016-05-20 MED ORDER — CYANOCOBALAMIN 1000 MCG/ML IJ SOLN
INTRAMUSCULAR | Status: AC
  Filled 2016-05-20: qty 1

## 2016-05-20 MED ORDER — ERGOCALCIFEROL 1.25 MG (50000 UT) PO CAPS: 50000.0000 [IU] | ORAL_CAPSULE | ORAL | 1 refills | Status: DC

## 2016-05-20 MED ORDER — SODIUM CHLORIDE 0.9 % IV SOLN
510.0000 mg | Freq: Once | INTRAVENOUS | Status: AC
  Administered 2016-05-20: 510 mg via INTRAVENOUS
  Filled 2016-05-20: qty 17

## 2016-05-20 MED ORDER — SODIUM CHLORIDE 0.9 % IV SOLN
Freq: Once | INTRAVENOUS | Status: AC
  Administered 2016-05-20: 11:00:00 via INTRAVENOUS

## 2016-05-20 MED ORDER — CYANOCOBALAMIN 1000 MCG/ML IJ SOLN
1000.0000 ug | Freq: Once | INTRAMUSCULAR | Status: AC
  Administered 2016-05-20: 1000 ug via INTRAMUSCULAR

## 2016-05-20 NOTE — Assessment & Plan Note (Signed)
Her previous B-12 level was low and with her history of gastric bypass surgery, she will need B-12 injection otherwise she would not have effective erythropoiesis I recommend vitamin B 12 injection today and to continue weekly  I plan to recheck B-12 level again in 6 months

## 2016-05-20 NOTE — Telephone Encounter (Signed)
Message sent to infusion scheduler to be added per 05/20/16 los. Appointments scheduled per 05/20/16 los. Patient was given a copy of the AVS report and appointment schedule per 05/20/16 los.

## 2016-05-20 NOTE — Assessment & Plan Note (Signed)
Her recent serum vitamin D was low. I recommend increasing high-dose vitamin D supplement to twice a week and recheck in 3 months

## 2016-05-20 NOTE — Assessment & Plan Note (Signed)
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 We discussed some of the risks, benefits, and alternatives of intravenous iron infusions. The patient is symptomatic from anemia and the iron level is critically low. She tolerated oral iron supplement poorly and desires to achieved higher levels of iron faster for adequate hematopoesis. Some of the side-effects to be expected including risks of infusion reactions, phlebitis, headaches, nausea and fatigue.  The patient is willing to proceed. Patient education material was dispensed.  Goal is to keep ferritin level greater than 50  I will recheck her blood work again in 3 months

## 2016-05-20 NOTE — Progress Notes (Signed)
[facility] OFFICE PROGRESS NOTE  Philis Fendt, MD SUMMARY OF HEMATOLOGIC HISTORY: Carrie Douglas is here because of severe iron deficiency anemia. 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 She had gastric bypass surgery after the birth of her daughter. She has 6 children in total. The range from 42-year-old to 42 year old. 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 She has received numerous blood transfusions over the past 7 years and also intermittent IV iron replacement therapy. She moved from [place] to [place] 5 years ago and has no consistent follow-up with hematologist.  She was found to have abnormal CBC from recent blood work. CBC dated back the past few years range from as low as 6.2 to as high as 10.8 She went to the emergency department recently complaining of dizziness. She complained of chest discomfort on exertion, shortness of breath on minimal exertion, pre-syncopal episodes, and palpitations. She had not noticed any recent bleeding such as epistaxis, hematuria or hematochezia The patient denies regular over the counter NSAID ingestion. She takes ibuprofen once or twice a month only.She is not on antiplatelets agents.  She had no prior history or diagnosis of cancer. Her age appropriate screening programs are up-to-date. She has significant pica with excessive chewing of ice but eats a variety of diet. She never donated blood The patient was prescribed oral iron supplements and she takes consistently 3 times a day with food. The iron pill causes significant constipation. She received multiple mineral replacement therapies in June 2017 INTERVAL HISTORY: Carrie Douglas 41 y.o. female returns for further follow-up. She complained of fatigue. She has a very busy work schedule and only takes one day off a week. She denies bone pain. She is compliant taking vitamin B-12 injection  weekly. The patient denies any recent signs or symptoms of bleeding such as spontaneous epistaxis, hematuria or hematochezia.   I have reviewed the past medical history, past surgical history, social history and family history with the patient and they are unchanged from previous note.  ALLERGIES:  is allergic to codeine and other.  MEDICATIONS:  Current Outpatient Prescriptions  Medication Sig Dispense Refill  . calcium-vitamin D (OSCAL WITH D) 500-200 MG-UNIT per tablet Take 1 tablet by mouth daily with breakfast.    . carbamazepine (TEGRETOL) 200 MG tablet Take 1-2 tablets (200-400 mg total) by mouth 2 (two) times daily. 360 tablet 4  . diclofenac sodium (VOLTAREN) 1 % GEL Apply 2 g topically 4 (four) times daily.    . ergocalciferol (VITAMIN D2) 50000 units capsule Take 1 capsule (50,000 Units total) by mouth 2 (two) times a week. 24 capsule 1  . ferrous sulfate 325 (65 FE) MG tablet Take 1 tablet (325 mg total) by mouth 2 (two) times daily with a meal. 60 tablet 3  . Multiple Vitamins-Minerals (MULTIVITAMIN ADULT PO) Take 1 tablet by mouth daily.    . phentermine (ADIPEX-P) 37.5 MG tablet Take 37.5 mg by mouth every morning.  0  . acetaminophen (TYLENOL) 500 MG tablet Take 1,000 mg by mouth every 6 (six) hours as needed for moderate pain.    Marland Kitchen HYDROcodone-acetaminophen (NORCO/VICODIN) 5-325 MG tablet Take 2 tablets by mouth every 4 (four) hours as needed. (Patient not taking: Reported on 02/17/2016) 15 tablet 0  . ibuprofen (ADVIL,MOTRIN) 200 MG tablet Take 600 mg by mouth every 6 (six) hours as needed for moderate pain.      No current facility-administered medications for  this visit.      REVIEW OF SYSTEMS:   Constitutional: Denies fevers, chills or night sweats Eyes: Denies blurriness of vision Ears, nose, mouth, throat, and face: Denies mucositis or sore throat Respiratory: Denies cough, dyspnea or wheezes Cardiovascular: Denies palpitation, chest discomfort or lower extremity  swelling Gastrointestinal:  Denies nausea, heartburn or change in bowel habits Skin: Denies abnormal skin rashes Lymphatics: Denies new lymphadenopathy or easy bruising Neurological:Denies numbness, tingling or new weaknesses Behavioral/Psych: Mood is stable, no new changes  All other systems were reviewed with the patient and are negative.  PHYSICAL EXAMINATION: ECOG PERFORMANCE STATUS: 1 - Symptomatic but completely ambulatory  Vitals:   05/20/16 0944  BP: 118/68  Pulse: 69  Resp: 18  Temp: 98.3 F (36.8 C)   Filed Weights   05/20/16 0944  Weight: 228 lb 1.6 oz (103.5 kg)    GENERAL:alert, no distress and comfortable SKIN: skin color, texture, turgor are normal, no rashes or significant lesions EYES: normal, Conjunctiva are pink and non-injected, sclera clear Musculoskeletal:no cyanosis of digits and no clubbing  NEURO: alert & oriented x 3 with fluent speech, no focal motor/sensory deficits  LABORATORY DATA:  I have reviewed the data as listed     Component Value Date/Time   NA 137 09/10/2015 1317   NA 141 07/29/2015 1344   K 4.1 09/10/2015 1317   CL 109 09/10/2015 1317   CO2 22 09/10/2015 1317   GLUCOSE 132 (H) 09/10/2015 1317   BUN 10 09/10/2015 1317   BUN 11 07/29/2015 1344   CREATININE 0.65 09/10/2015 1317   CALCIUM 8.9 09/10/2015 1317   PROT 6.6 09/10/2015 1317   PROT 6.8 07/29/2015 1344   ALBUMIN 3.8 09/10/2015 1317   ALBUMIN 4.3 07/29/2015 1344   AST 15 09/10/2015 1317   ALT 9 (L) 09/10/2015 1317   ALKPHOS 68 09/10/2015 1317   BILITOT 0.1 (L) 09/10/2015 1317   BILITOT <0.2 07/29/2015 1344   GFRNONAA >60 09/10/2015 1317   GFRAA >60 09/10/2015 1317    No results found for: SPEP, UPEP  Lab Results  Component Value Date   WBC 6.3 05/13/2016   NEUTROABS 3.7 05/13/2016   HGB 15.2 05/13/2016   HCT 43.2 05/13/2016   MCV 87.8 05/13/2016   PLT 207 05/13/2016      Chemistry      Component Value Date/Time   NA 137 09/10/2015 1317   NA 141  07/29/2015 1344   K 4.1 09/10/2015 1317   CL 109 09/10/2015 1317   CO2 22 09/10/2015 1317   BUN 10 09/10/2015 1317   BUN 11 07/29/2015 1344   CREATININE 0.65 09/10/2015 1317      Component Value Date/Time   CALCIUM 8.9 09/10/2015 1317   ALKPHOS 68 09/10/2015 1317   AST 15 09/10/2015 1317   ALT 9 (L) 09/10/2015 1317   BILITOT 0.1 (L) 09/10/2015 1317   BILITOT <0.2 07/29/2015 1344      ASSESSMENT & PLAN:  Iron deficiency anemia Even though her anemia has resolved she remains iron deficient She complained of excessive fatigue Given her history of gastric bypass, she will not be able to observe iron adequately 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 We discussed some of the risks, benefits, and alternatives of intravenous iron infusions. The patient is symptomatic from anemia and the iron level is critically low. She tolerated oral iron supplement poorly and desires to achieved higher levels of iron faster for adequate hematopoesis. Some of the  side-effects to be expected including risks of infusion reactions, phlebitis, headaches, nausea and fatigue.  The patient is willing to proceed. Patient education material was dispensed.  Goal is to keep ferritin level greater than 50  I will recheck her blood work again in 3 months  Vitamin B12 deficiency Her previous B-12 level was low and with her history of gastric bypass surgery, she will need B-12 injection otherwise she would not have effective erythropoiesis I recommend vitamin B 12 injection today and to continue weekly  I plan to recheck B-12 level again in 6 months  Vitamin D deficiency Her recent serum vitamin D was low. I recommend increasing high-dose vitamin D supplement to twice a week and recheck in 3 months   Orders Placed This Encounter  Procedures  . Vitamin D 25 hydroxy    Standing Status:   Future    Standing Expiration Date:   05/20/2017    All questions were answered. The patient  knows to call the clinic with any problems, questions or concerns. No barriers to learning was detected.  I spent 15 minutes counseling the patient face to face. The total time spent in the appointment was 20 minutes and more than 50% was on counseling.     Heath Lark, MD 2/8/201810:29 AM

## 2016-05-20 NOTE — Telephone Encounter (Signed)
Per 2/8 LOS and staff message I have scheduled appt and gave calendar. Notified the scheduler

## 2016-05-20 NOTE — Patient Instructions (Signed)
Ferumoxytol injection What is this medicine? FERUMOXYTOL is an iron complex. Iron is used to make healthy red blood cells, which carry oxygen and nutrients throughout the body. This medicine is used to treat iron deficiency anemia in people with chronic kidney disease. COMMON BRAND NAME(S): Feraheme What should I tell my health care provider before I take this medicine? They need to know if you have any of these conditions: -anemia not caused by low iron levels -high levels of iron in the blood -magnetic resonance imaging (MRI) test scheduled -an unusual or allergic reaction to iron, other medicines, foods, dyes, or preservatives -pregnant or trying to get pregnant -breast-feeding How should I use this medicine? This medicine is for injection into a vein. It is given by a health care professional in a hospital or clinic setting. Talk to your pediatrician regarding the use of this medicine in children. Special care may be needed. What if I miss a dose? It is important not to miss your dose. Call your doctor or health care professional if you are unable to keep an appointment. What may interact with this medicine? This medicine may interact with the following medications: -other iron products What should I watch for while using this medicine? Visit your doctor or healthcare professional regularly. Tell your doctor or healthcare professional if your symptoms do not start to get better or if they get worse. You may need blood work done while you are taking this medicine. You may need to follow a special diet. Talk to your doctor. Foods that contain iron include: whole grains/cereals, dried fruits, beans, or peas, leafy green vegetables, and organ meats (liver, kidney). What side effects may I notice from receiving this medicine? Side effects that you should report to your doctor or health care professional as soon as possible: -allergic reactions like skin rash, itching or hives, swelling of the  face, lips, or tongue -breathing problems -changes in blood pressure -feeling faint or lightheaded, falls -fever or chills -flushing, sweating, or hot feelings -swelling of the ankles or feet Side effects that usually do not require medical attention (report to your doctor or health care professional if they continue or are bothersome): -diarrhea -headache -nausea, vomiting -stomach pain Where should I keep my medicine? This drug is given in a hospital or clinic and will not be stored at home.  2017 Elsevier/Gold Standard (2015-05-01 12:41:49)  

## 2016-05-28 ENCOUNTER — Ambulatory Visit
Admission: RE | Admit: 2016-05-28 | Discharge: 2016-05-28 | Disposition: A | Discharge status: Home / Self Care | Payer: Medicaid Other | Source: Ambulatory Visit | Attending: Obstetrics & Gynecology | Admitting: Obstetrics & Gynecology

## 2016-05-28 DIAGNOSIS — Z1231 Encounter for screening mammogram for malignant neoplasm of breast: Secondary | ICD-10-CM

## 2016-06-14 ENCOUNTER — Encounter: Payer: Medicaid Other | Attending: Internal Medicine | Admitting: Registered"

## 2016-06-14 DIAGNOSIS — E669 Obesity, unspecified: Secondary | ICD-10-CM | POA: Insufficient documentation

## 2016-06-14 DIAGNOSIS — E6609 Other obesity due to excess calories: Secondary | ICD-10-CM

## 2016-06-14 DIAGNOSIS — Z713 Dietary counseling and surveillance: Secondary | ICD-10-CM | POA: Diagnosis not present

## 2016-06-14 NOTE — Progress Notes (Signed)
Medical Nutrition Therapy:  Appt start time: 0845 end time:  0915.   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 Patient reports she 3-4 days a week she only sleeps about 3 hours (9am-noon).   Wt Readings from Last 3 Encounters:  06/14/16 234 lb 1.6 oz (106.2 kg)  05/20/16 228 lb 1.6 oz (103.5 kg)  05/03/16 225 lb 6.4 oz (102.2 kg)   02/17/16 239 lb 14.4 oz (108.8 kg)  01/27/16 240 lb (108.9 kg)   Preferred Learning Style:   No preference indicated   Learning Readiness:   Change in progress  MEDICATIONS: reviewed   DIETARY INTAKE:  Usual eating pattern includes 2 meals and 1 snack per day. States she is not hungry in the morning and fixes breakfast for her kids but will keep busy cleaning house in the morning. Gets very hungry around 10 am but doesn't stop until lunch time.  24-hr recall:  B ( AM): none OR 1 hard-boiled egg Snk ( AM): sips on protein drink  L ( PM): chicken salad with vegetables grilled Snk ( PM): none D (5 PM): meat OR 1 cup beans, 1/2 baked potato, vegetables Snk ( PM): Nothing  Beverages: water, (proud of herself) lowered intake of diet pepsi from 2 liters to 4-6 oz with dinner.  Usual physical activity: house cleaning, has treadmill and stationery bike. Was doing 45 min moderate activity 2 times per day (until last 2 weeks). Plans to get back into routine.  Estimated energy needs: 1600 calories 180 g carbohydrates 120 g protein 44 g fat  Progress Towards Goal(s):  In progress.   Nutritional Diagnosis:  [place]-3.3 Overweight/obesity As related to history of excess caloric intake.  As evidenced by BMI of 36, patient weight loss goal.    Intervention:  Nutrition Education.  Reviewed importance of sleep and role it can play in weight management. Reviewed alternative exercise options.  Plan: Consider geting more on Saturdays when you don't need to take son to school Consider doing arm chair exercises and use bands or cans for resistance. Consider choosing healthier snacks  Teaching Method Utilized:  Visual Auditory  Handouts given during visit include:  Snack sheet  Arm Chair exercise  Barriers to learning/adherence to lifestyle change: work schedule  Demonstrated degree of understanding via:  Teach Back   Monitoring/Evaluation:  Dietary intake, exercise, and body weight in 3 month(s).

## 2016-06-16 ENCOUNTER — Other Ambulatory Visit (HOSPITAL_COMMUNITY)
Admission: RE | Admit: 2016-06-16 | Discharge: 2016-06-16 | Disposition: A | Discharge status: Home / Self Care | Payer: Medicaid Other | Source: Ambulatory Visit | Attending: Obstetrics and Gynecology | Admitting: Obstetrics and Gynecology

## 2016-06-16 ENCOUNTER — Ambulatory Visit (INDEPENDENT_AMBULATORY_CARE_PROVIDER_SITE_OTHER): Payer: Medicaid Other | Admitting: Obstetrics and Gynecology

## 2016-06-16 ENCOUNTER — Encounter: Payer: Self-pay | Admitting: Obstetrics and Gynecology

## 2016-06-16 VITALS — BP 109/64 | HR 73 | Wt 230.2 lb

## 2016-06-16 DIAGNOSIS — Z113 Encounter for screening for infections with a predominantly sexual mode of transmission: Secondary | ICD-10-CM | POA: Diagnosis not present

## 2016-06-16 DIAGNOSIS — Z0000 Encounter for general adult medical examination without abnormal findings: Secondary | ICD-10-CM

## 2016-06-16 DIAGNOSIS — Z01419 Encounter for gynecological examination (general) (routine) without abnormal findings: Secondary | ICD-10-CM

## 2016-06-16 NOTE — Progress Notes (Signed)
Obstetrics and Gynecology Annual Patient Evaluation  Appointment Date: 06/16/2016  OBGYN Clinic: [facility]  Primary Care Provider: Philis Douglas  Referring Provider: Nolene Ebbs, MD  Chief Complaint: annual exam  History of Present Illness: Carrie Douglas is a 41 y.o. Caucasian [phone] (Patient's last menstrual period was 08/22/2014 (exact date).), seen for the above chief complaint. Her past medical history is significant for h/o LAVH for AUB.  Patient denies any OBGYN complaints or issues.    No breast s/s, fevers, chills, chest pain, SOB, nausea, vomiting, abdominal pain, dysuria, hematuria, vaginal itching, dyspareunia, diarrhea, constipation, blood in BMs, VB or spotting.   Review of Systems:  as noted in the History of Present Illness.  Past Medical History:  Past Medical History:  Diagnosis Date  . Anemia    blood transfusion 09/14/10  . Anxiety   . Arthritis    "knees" (07/02/2014)  . Depression   . 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 Iron deficiency anemia   . Rubella    5 months old  . Sleep apnea   . Vaginal delivery 1997, 1999, 2002, 2006, 2008, 2012  . Varicella    29 1/42 years old    Past Surgical History:  Past Surgical History:  Procedure Laterality Date  . CHOLECYSTECTOMY OPEN  2007  . DILATION AND CURETTAGE OF UTERUS  07/2013   "not a D&E"  . DILATION AND EVACUATION N/A 07/13/2013   Procedure: DILATATION AND EVACUATION;  Surgeon: Woodroe Mode, MD;  Location: [facility];  Service: Gynecology;  Laterality: N/A;  . LAPAROSCOPIC ASSISTED VAGINAL HYSTERECTOMY N/A 09/17/2014   Procedure: DIAGNOSTIC LAPAROSCOPIC, VAGINAL HYSTERECTOMY;  Surgeon: Osborne Oman, MD;  Location: [facility];  Service: Gynecology;  Laterality: N/A;  . LAPAROSCOPIC ASSISTED VAGINAL HYSTERECTOMY  2016   For AUB  . LAPAROSCOPIC BILATERAL SALPINGECTOMY Bilateral 10/10/2013   Procedure:  LAPAROSCOPIC BILATERAL SALPINGECTOMY;  Surgeon: Osborne Oman, MD;  Location: [facility];  Service: Gynecology;  Laterality: Bilateral;  with single site port  . ROUX-EN-Y GASTRIC BYPASS  2004  . SALPINGOOPHORECTOMY Bilateral 08/2013    Past Obstetrical History:  OB History  Gravida Para Term Preterm AB Living  6 6 6  0 0 6  SAB TAB Ectopic Multiple Live Births  0 0 0 0 6    # Outcome Date GA Lbr Len/2nd Weight Sex Delivery Anes PTL Lv  6 Term 12/26/10 [redacted]w[redacted]d 06:40 / 00:01 7 lb 1.4 oz (3.215 kg) M Vag-Spont None  LIV  5 Term 2008 [redacted]w[redacted]d 07:00 7 lb 3 oz (3.26 kg) F Vag-Spont None  LIV  4 Term 2006 [redacted]w[redacted]d 05:00 7 lb 6 oz (3.345 kg) F Vag-Spont None  LIV  3 Term 2002 [redacted]w[redacted]d 10:00 9 lb 4 oz (4.196 kg) F Vag-Spont None  LIV     Birth Comments: gestational HTN  2 Term 1999 [redacted]w[redacted]d 15:00 9 lb 13 oz (4.451 kg) M Vag-Spont None  LIV     Birth Comments: no complications  1 Term 79/0240 [redacted]w[redacted]d 12:00 7 lb 9 oz (3.43 kg) F Vag-Spont None  LIV     Birth Comments: no complications      Past Gynecological History: As per HPI.  Social History:  Social History   Social History  . Marital status: Married    Spouse name: Carrie Douglas  . Number of children: 5  . Years of education: 14   Occupational History  .  self employed Optometrist   Social History Main Topics  . Smoking status: Current Every Day Smoker    Packs/day: 0.25    Years: 5.00    Types: Cigarettes  . Smokeless tobacco: Never Used     Comment: 04/30/15 10 cigs daily  . Alcohol use No  . Drug use: No  . Sexual activity: Not Currently    Birth control/ protection: Condom, Surgical     Comment: IUD removed in 2010, period method   Other Topics Concern  . Not on file   Social History Narrative   Husband and 5 children at home   Caffeine use- coffee 2 cups daily, diet pepsi    Family History:  Family History  Problem Relation Age of Onset  . Lung cancer Father   . Hypertension Father   . Heart disease Maternal Grandmother    . Heart disease Maternal Grandfather   . Heart disease Paternal Grandmother   . Heart disease Paternal Grandfather    She denies any female cancers, bleeding or blood clotting disorders.   Health Maintenance:  Mammogram Yes.  , which was done in 2018 and negative;  Medications Carrie Douglas had no medications administered during this visit. Current Outpatient Prescriptions  Medication Sig Dispense Refill  . calcium-vitamin D (OSCAL WITH D) 500-200 MG-UNIT per tablet Take 1 tablet by mouth daily with breakfast.    . carbamazepine (TEGRETOL) 200 MG tablet Take 1-2 tablets (200-400 mg total) by mouth 2 (two) times daily. 360 tablet 4  . ergocalciferol (VITAMIN D2) 50000 units capsule Take 1 capsule (50,000 Units total) by mouth 2 (two) times a week. 24 capsule 1  . ferrous sulfate 325 (65 FE) MG tablet Take 1 tablet (325 mg total) by mouth 2 (two) times daily with a meal. 60 tablet 3  . Multiple Vitamins-Minerals (MULTIVITAMIN ADULT PO) Take 1 tablet by mouth daily.    Marland Kitchen acetaminophen (TYLENOL) 500 MG tablet Take 1,000 mg by mouth every 6 (six) hours as needed for moderate pain.    Marland Kitchen diclofenac sodium (VOLTAREN) 1 % GEL Apply 2 g topically 4 (four) times daily.    Marland Kitchen HYDROcodone-acetaminophen (NORCO/VICODIN) 5-325 MG tablet Take 2 tablets by mouth every 4 (four) hours as needed. (Patient not taking: Reported on 02/17/2016) 15 tablet 0  . ibuprofen (ADVIL,MOTRIN) 200 MG tablet Take 600 mg by mouth every 6 (six) hours as needed for moderate pain.     . phentermine (ADIPEX-P) 37.5 MG tablet Take 37.5 mg by mouth every morning.  0   No current facility-administered medications for this visit.     Allergies Codeine and Other   Physical Exam:  BP 109/64   Pulse 73   Wt 230 lb 3.2 oz (104.4 kg)   LMP 08/22/2014 (Exact Date)   BMI 37.16 kg/m  Body mass index is 37.16 kg/m. General appearance: Well nourished, well developed female in no acute distress.  Neck:  Supple, normal appearance, and  no thyromegaly  Cardiovascular: normal s1 and s2.  No murmurs, rubs or gallops. Respiratory:  Clear to auscultation bilateral. Normal respiratory effort Abdomen: positive bowel sounds and no masses, hernias; diffusely non tender to palpation, non distended Breasts: breasts appear normal, no suspicious masses, no skin or nipple changes or axillary nodes, and palpation negative. Neuro/Psych:  Normal mood and affect.  Skin:  Warm and dry.  Lymphatic:  No inguinal lymphadenopathy.   Pelvic exam: is not limited by body habitus EGBUS: within normal limits, Vagina: within normal limits and  with no blood or discharge in the vault, well healed and nttp cuff Cervix: surgically absent. Uterus:  Surgically absent Adnexa:  normal adnexa and no mass, fullness, tenderness Rectovaginal: deferred  Laboratory: none  Radiology: none  Assessment:   Plan:  1. Encounter for gynecological examination (general) (routine) without abnormal findings Pt doing well. D/w pt that since path on hyst negative and no h/o of abnormal paps, per patient, that no need for paps in the future as long as asymptomatic. Pt desires STD testing.  - Cervicovaginal ancillary only - HIV antibody (with reflex) - RPR - Hepatitis B Surface AntiGEN - Hepatitis C Antibody  RTC prn   Durene Romans MD Attending [facility])

## 2016-06-16 NOTE — Patient Instructions (Signed)
Try different oil and water lubricants.

## 2016-06-17 ENCOUNTER — Encounter (HOSPITAL_COMMUNITY): Payer: Self-pay | Admitting: Emergency Medicine

## 2016-06-17 ENCOUNTER — Emergency Department (HOSPITAL_COMMUNITY)
Admission: EM | Admit: 2016-06-17 | Discharge: 2016-06-17 | Disposition: A | Discharge status: Home / Self Care | Payer: Medicaid Other | Attending: Emergency Medicine | Admitting: Emergency Medicine

## 2016-06-17 DIAGNOSIS — F17210 Nicotine dependence, cigarettes, uncomplicated: Secondary | ICD-10-CM | POA: Diagnosis not present

## 2016-06-17 DIAGNOSIS — H9201 Otalgia, right ear: Secondary | ICD-10-CM | POA: Diagnosis present

## 2016-06-17 DIAGNOSIS — Z79899 Other long term (current) drug therapy: Secondary | ICD-10-CM | POA: Diagnosis not present

## 2016-06-17 DIAGNOSIS — G500 Trigeminal neuralgia: Secondary | ICD-10-CM

## 2016-06-17 LAB — HIV ANTIBODY (ROUTINE TESTING W REFLEX): HIV SCREEN 4TH GENERATION: NONREACTIVE

## 2016-06-17 LAB — CERVICOVAGINAL ANCILLARY ONLY
CHLAMYDIA, DNA PROBE: NEGATIVE
Neisseria Gonorrhea: NEGATIVE
Trichomonas: NEGATIVE

## 2016-06-17 LAB — RPR: RPR: NONREACTIVE

## 2016-06-17 LAB — HEPATITIS C ANTIBODY: Hep C Virus Ab: <0.1 s/co ratio (ref 0.0–0.9)

## 2016-06-17 LAB — HEPATITIS B SURFACE ANTIGEN: Hepatitis B Surface Ag: NEGATIVE

## 2016-06-17 MED ORDER — METOCLOPRAMIDE HCL 5 MG/ML IJ SOLN
10.0000 mg | Freq: Once | INTRAMUSCULAR | Status: AC
  Administered 2016-06-17: 10 mg via INTRAVENOUS
  Filled 2016-06-17: qty 2

## 2016-06-17 MED ORDER — DIPHENHYDRAMINE HCL 50 MG/ML IJ SOLN
25.0000 mg | Freq: Once | INTRAMUSCULAR | Status: AC
  Administered 2016-06-17: 25 mg via INTRAVENOUS
  Filled 2016-06-17: qty 1

## 2016-06-17 MED ORDER — DEXAMETHASONE SODIUM PHOSPHATE 10 MG/ML IJ SOLN
10.0000 mg | Freq: Once | INTRAMUSCULAR | Status: AC
  Administered 2016-06-17: 10 mg via INTRAVENOUS
  Filled 2016-06-17: qty 1

## 2016-06-17 MED ORDER — SODIUM CHLORIDE 0.9 % IV BOLUS (SEPSIS)
1000.0000 mL | Freq: Once | INTRAVENOUS | Status: AC
  Administered 2016-06-17: 1000 mL via INTRAVENOUS

## 2016-06-17 MED ORDER — KETOROLAC TROMETHAMINE 30 MG/ML IJ SOLN
30.0000 mg | Freq: Once | INTRAMUSCULAR | Status: AC
  Administered 2016-06-17: 30 mg via INTRAVENOUS
  Filled 2016-06-17: qty 1

## 2016-06-17 NOTE — ED Triage Notes (Signed)
Pt report sharp, extreme right ear pain that began today, reports hx of same, states she takes meds for it but they are not working. Pt a/o, crying in triage.

## 2016-06-17 NOTE — ED Notes (Signed)
Pt verbalized understanding discharge instructions and denies any further needs or questions at this time. VS stable, ambulatory and steady gait.   

## 2016-06-17 NOTE — ED Provider Notes (Signed)
[facility] DEPT Provider Note   CSN: [id number] Arrival date & time: 06/17/16  1745     History   Chief Complaint Chief Complaint  Patient presents with  . Otalgia    HPI Carrie Douglas is a 41 y.o. female.  Carrie Douglas is a 41 y.o. Female with a history of trigeminal neuralgia who presents to the emergency department complaining of a flare of her trigeminal neuralgia today. Patient is a long history of trigeminal neuralgia and has been seen by a neurologist who diagnosed her with atypical trigeminal neuralgia. She takes Tegretol daily for this. She reports today she had worsening pain around her right ear with an associated headache. She reports this feels like her trigeminal neuralgia, but the Tegretol did not help this. She has taken nothing else for treatment of her symptoms today. She denies any ear discharge or decreased hearing. She denies fevers, neck stiffness, numbness, tingling, weakness, decreased hearing, abdominal pain, vomiting, diarrhea, chest pain, shortness of breath, changes to her vision or sore throat.   The history is provided by the patient and medical records. No language interpreter was used.  Otalgia  Associated symptoms include headaches. Pertinent negatives include no ear discharge, no hearing loss, no rhinorrhea, no sore throat, no abdominal pain, no diarrhea, no vomiting, no neck pain, no cough and no rash.    Past Medical History:  Diagnosis Date  . Anemia    blood transfusion 09/14/10  . Anxiety   . Arthritis    "knees" (07/02/2014)  . Depression   . 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 Iron deficiency anemia   . Rubella    5 months old  . Sleep apnea   . Vaginal delivery 1997, 1999, 2002, 2006, 2008, 2012  . Varicella    69 1/41 years old    Patient Active Problem List   Diagnosis Date Noted  . Iron deficiency anemia 09/16/2015  . Vitamin B12 deficiency 09/16/2015  .  S/P gastric bypass 09/16/2015  . Vitamin D deficiency 09/16/2015  . Trigeminal neuralgia of right side of face 05/02/2015  . S/P laparoscopic assisted vaginal hysterectomy (LAVH) 09/17/2014  . Chest pain 07/02/2014  . Symptomatic anemia 07/02/2014  . Depression 07/02/2014  . Pain in the chest   . Syncope and collapse 05/31/2013  . Abdominal pain, left lower quadrant 09/06/2012  . Anemia of chronic disease 09/06/2012  . 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 Viral enteritis 09/06/2012  . Nausea vomiting and diarrhea 09/06/2012  . Frequent UTI 12/15/2010    Past Surgical History:  Procedure Laterality Date  . CHOLECYSTECTOMY OPEN  2007  . DILATION AND CURETTAGE OF UTERUS  07/2013   "not a D&E"  . DILATION AND EVACUATION N/A 07/13/2013   Procedure: DILATATION AND EVACUATION;  Surgeon: Woodroe Mode, MD;  Location: [facility];  Service: Gynecology;  Laterality: N/A;  . LAPAROSCOPIC ASSISTED VAGINAL HYSTERECTOMY N/A 09/17/2014   Procedure: DIAGNOSTIC LAPAROSCOPIC, VAGINAL HYSTERECTOMY;  Surgeon: Osborne Oman, MD;  Location: [facility];  Service: Gynecology;  Laterality: N/A;  . LAPAROSCOPIC ASSISTED VAGINAL HYSTERECTOMY  2016   For AUB  . LAPAROSCOPIC BILATERAL SALPINGECTOMY Bilateral 10/10/2013   Procedure: LAPAROSCOPIC BILATERAL SALPINGECTOMY;  Surgeon: Osborne Oman, MD;  Location: [facility];  Service: Gynecology;  Laterality: Bilateral;  with single site port  . ROUX-EN-Y GASTRIC BYPASS  2004  . SALPINGOOPHORECTOMY Bilateral 08/2013    OB History  Gravida Para Term Preterm AB Living   6 6 6  0 0 6   SAB TAB Ectopic Multiple Live Births   0 0 0 0 6       Home Medications    Prior to Admission medications   Medication Sig Start Date End Date Taking? Authorizing Provider  calcium-vitamin D (OSCAL WITH D) 500-200 MG-UNIT per tablet Take 1 tablet by mouth daily with breakfast.   Yes Historical Provider, MD  carbamazepine (TEGRETOL) 200 MG tablet Take 1-2 tablets (200-400 mg  total) by mouth 2 (two) times daily. 01/27/16  Yes Penni Bombard, MD  diclofenac sodium (VOLTAREN) 1 % GEL Apply 2 g topically daily as needed (pain).    Yes Historical Provider, MD  ergocalciferol (VITAMIN D2) 50000 units capsule Take 1 capsule (50,000 Units total) by mouth 2 (two) times a week. Patient taking differently: Take 50,000 Units by mouth 2 (two) times a week. mOn and fri 05/20/16  Yes Heath Lark, MD  ibuprofen (ADVIL,MOTRIN) 200 MG tablet Take 400 mg by mouth every 6 (six) hours as needed for moderate pain.    Yes Historical Provider, MD  Multiple Vitamins-Minerals (MULTIVITAMIN ADULT PO) Take 1 tablet by mouth daily.   Yes Historical Provider, MD  phentermine (ADIPEX-P) 37.5 MG tablet Take 37.5 mg by mouth every morning. 02/12/16  Yes Historical Provider, MD    Family History Family History  Problem Relation Age of Onset  . Lung cancer Father   . Hypertension Father   . Heart disease Maternal Grandmother   . Heart disease Maternal Grandfather   . Heart disease Paternal Grandmother   . Heart disease Paternal Grandfather     Social History Social History  Substance Use Topics  . Smoking status: Current Every Day Smoker    Packs/day: 0.25    Years: 5.00    Types: Cigarettes  . Smokeless tobacco: Never Used     Comment: 04/30/15 10 cigs daily  . Alcohol use No     Allergies   Codeine and Other   Review of Systems Review of Systems  Constitutional: Negative for chills and fever.  HENT: Positive for ear pain. Negative for congestion, dental problem, ear discharge, facial swelling, hearing loss, mouth sores, postnasal drip, rhinorrhea, sinus pain, sore throat and trouble swallowing.   Eyes: Negative for pain and visual disturbance.  Respiratory: Negative for cough and shortness of breath.   Cardiovascular: Negative for chest pain.  Gastrointestinal: Negative for abdominal pain, diarrhea, nausea and vomiting.  Genitourinary: Negative for dysuria.  Musculoskeletal:  Negative for back pain and neck pain.  Skin: Negative for rash.  Neurological: Positive for headaches. Negative for dizziness, syncope, speech difficulty, weakness, light-headedness and numbness.     Physical Exam Updated Vital Signs BP 112/71   Pulse 66   Temp 97.3 F (36.3 C) (Oral)   Resp 17   LMP 08/22/2014 (Exact Date)   SpO2 99%   Physical Exam  Constitutional: She is oriented to person, place, and time. She appears well-developed and well-nourished. No distress.  Nontoxic appearing.  HENT:  Head: Normocephalic and atraumatic.  Right Ear: External ear normal.  Left Ear: External ear normal.  Mouth/Throat: Oropharynx is clear and moist.  No TM erythema or loss of landmarks bilaterally. Patient has tenderness surrounding her right ear with some mild tenderness up to her temporal area. No temporal edema bilaterally. No external auditory canal edema or discharge. Throat is clear.  Eyes: Conjunctivae and EOM are normal. Pupils are equal, round,  and reactive to light. Right eye exhibits no discharge. Left eye exhibits no discharge.  Neck: Normal range of motion. Neck supple. No JVD present. No tracheal deviation present.  Cardiovascular: Normal rate, regular rhythm, normal heart sounds and intact distal pulses.  Exam reveals no gallop and no friction rub.   No murmur heard. Pulmonary/Chest: Effort normal and breath sounds normal. No stridor. No respiratory distress. She has no wheezes. She has no rales.  Abdominal: Soft. There is no tenderness.  Musculoskeletal: Normal range of motion. She exhibits no edema or tenderness.  Lymphadenopathy:    She has no cervical adenopathy.  Neurological: She is alert and oriented to person, place, and time. No cranial nerve deficit or sensory deficit. She exhibits normal muscle tone. Coordination normal.  Patient is alert and oriented 3. Cranial nerves are intact. Speech is clear and coherent. Finger to nose is intact bilaterally. Vision is  grossly intact. EOMs are intact. Hearing is grossly intact.  Skin: Skin is warm and dry. Capillary refill takes less than 2 seconds. No rash noted. She is not diaphoretic. No erythema. No pallor.  Psychiatric: She has a normal mood and affect. Her behavior is normal.  Nursing note and vitals reviewed.    ED Treatments / Results  Labs (all labs ordered are listed, but only abnormal results are displayed) Labs Reviewed - No data to display  EKG  EKG Interpretation None       Radiology No results found.  Procedures Procedures (including critical care time)  Medications Ordered in ED Medications  sodium chloride 0.9 % bolus 1,000 mL (0 mLs Intravenous Stopped 06/17/16 2129)  metoCLOPramide (REGLAN) injection 10 mg (10 mg Intravenous Given 06/17/16 1914)  diphenhydrAMINE (BENADRYL) injection 25 mg (25 mg Intravenous Given 06/17/16 1914)  ketorolac (TORADOL) 30 MG/ML injection 30 mg (30 mg Intravenous Given 06/17/16 1914)  dexamethasone (DECADRON) injection 10 mg (10 mg Intravenous Given 06/17/16 1914)     Initial Impression / Assessment and Plan / ED Course  I have reviewed the triage vital signs and the nursing notes.  Pertinent labs & imaging results that were available during my care of the patient were reviewed by me and considered in my medical decision making (see chart for details).    This is a 41 y.o. Female with a history of trigeminal neuralgia who presents to the emergency department complaining of a flare of her trigeminal neuralgia today. Patient is a long history of trigeminal neuralgia and has been seen by a neurologist who diagnosed her with atypical trigeminal neuralgia. She takes Tegretol daily for this. She reports today she had worsening pain around her right ear with an associated headache. She reports this feels like her trigeminal neuralgia, but the Tegretol did not help this. She has taken nothing else for treatment of her symptoms today. She denies any ear  discharge or decreased hearing. She denies fevers.  On exam the patient is afebrile nontoxic appearing. She has no focal neurological deficits on exam. She does have tenderness surrounding her right ear that extends slightly up into her head and temple. Her hearing is intact. She has no focal neurological deficits. She reports this feels just like her trigeminal neuralgia. Patient received migraine cocktail which included Toradol and on reevaluation patient reports her pain has completely resolved. She is feeling much better and ready for discharge. I encouraged her to follow-up with her neurologist. Discussed return precautions. I advised the patient to follow-up with their primary care provider this week. I advised  the patient to return to the emergency department with new or worsening symptoms or new concerns. The patient verbalized understanding and agreement with plan.    This patient was discussed with Dr. Ellender Hose who agrees with assessment and plan.   Final Clinical Impressions(s) / ED Diagnoses   Final diagnoses:  Trigeminal neuralgia of right side of face    New Prescriptions New Prescriptions   No medications on file     Waynetta Pean, PA-C 06/17/16 2241    Duffy Bruce, MD 06/18/16 1102

## 2016-07-27 ENCOUNTER — Ambulatory Visit: Payer: Medicaid Other | Admitting: Diagnostic Neuroimaging

## 2016-08-02 ENCOUNTER — Telehealth: Payer: Self-pay | Admitting: *Deleted

## 2016-08-02 NOTE — Telephone Encounter (Signed)
LMVM for pt that was calling to reschedule appt (due to power outage). Call us back to reschedule.

## 2016-08-12 ENCOUNTER — Other Ambulatory Visit (HOSPITAL_BASED_OUTPATIENT_CLINIC_OR_DEPARTMENT_OTHER): Payer: Medicaid Other

## 2016-08-12 DIAGNOSIS — E559 Vitamin D deficiency, unspecified: Secondary | ICD-10-CM

## 2016-08-12 DIAGNOSIS — D509 Iron deficiency anemia, unspecified: Secondary | ICD-10-CM | POA: Diagnosis present

## 2016-08-12 DIAGNOSIS — Z9884 Bariatric surgery status: Secondary | ICD-10-CM

## 2016-08-12 DIAGNOSIS — E538 Deficiency of other specified B group vitamins: Secondary | ICD-10-CM

## 2016-08-12 LAB — CBC & DIFF AND RETIC
BASO%: 0.2 % (ref 0.0–2.0)
BASOS ABS: 0 10*3/uL (ref 0.0–0.1)
EOS ABS: 0.1 10*3/uL (ref 0.0–0.5)
EOS%: 1.3 % (ref 0.0–7.0)
HCT: 43.6 % (ref 34.8–46.6)
HEMOGLOBIN: 14.4 g/dL (ref 11.6–15.9)
IMMATURE RETIC FRACT: 2.9 % (ref 1.60–10.00)
LYMPH#: 2.1 10*3/uL (ref 0.9–3.3)
LYMPH%: 33.2 % (ref 14.0–49.7)
MCH: 31.7 pg (ref 25.1–34.0)
MCHC: 33 g/dL (ref 31.5–36.0)
MCV: 96 fL (ref 79.5–101.0)
MONO#: 0.5 10*3/uL (ref 0.1–0.9)
MONO%: 7.2 % (ref 0.0–14.0)
NEUT#: 3.6 10*3/uL (ref 1.5–6.5)
NEUT%: 58.1 % (ref 38.4–76.8)
PLATELETS: 189 10*3/uL (ref 145–400)
RBC: 4.54 10*6/uL (ref 3.70–5.45)
RDW: 13.4 % (ref 11.2–14.5)
RETIC %: 0.95 % (ref 0.70–2.10)
RETIC CT ABS: 43.13 10*3/uL (ref 33.70–90.70)
WBC: 6.2 10*3/uL (ref 3.9–10.3)

## 2016-08-12 LAB — FERRITIN: Ferritin: 72 ng/ml (ref 9–269)

## 2016-08-13 ENCOUNTER — Telehealth: Payer: Self-pay | Admitting: *Deleted

## 2016-08-13 ENCOUNTER — Other Ambulatory Visit: Payer: Self-pay | Admitting: Hematology and Oncology

## 2016-08-13 DIAGNOSIS — E559 Vitamin D deficiency, unspecified: Secondary | ICD-10-CM

## 2016-08-13 DIAGNOSIS — E538 Deficiency of other specified B group vitamins: Secondary | ICD-10-CM

## 2016-08-13 LAB — VITAMIN D 25 HYDROXY (VIT D DEFICIENCY, FRACTURES): VIT D 25 HYDROXY: 17.9 ng/mL — AB (ref 30.0–100.0)

## 2016-08-13 MED ORDER — ERGOCALCIFEROL 1.25 MG (50000 UT) PO CAPS: ORAL_CAPSULE | ORAL | 0 refills | Status: AC

## 2016-08-13 NOTE — Telephone Encounter (Signed)
Notified of message below. Is OK coming back in 3 months.

## 2016-08-13 NOTE — Telephone Encounter (Signed)
-----   Message from Heath Lark, MD sent at 08/13/2016  9:30 AM EDT ----- Regarding: labs  She is not anemic We can cancel her appt next week. Her vitamin D level is low, I recommend she continues to take Vitamin D 50,000 units once a week and recheck in 3 months You may call it in If she agrees to cancel her Rx, let me know and I will reorder tests and place scheduling msg ----- Message ----- From: Interface, Lab In Three Zero One Sent: 08/12/2016   9:12 AM To: Heath Lark, MD

## 2016-08-19 ENCOUNTER — Ambulatory Visit: Payer: Medicaid Other | Admitting: Hematology and Oncology

## 2016-08-19 ENCOUNTER — Ambulatory Visit: Payer: Medicaid Other

## 2016-08-24 ENCOUNTER — Ambulatory Visit: Payer: Medicaid Other | Admitting: Diagnostic Neuroimaging

## 2016-09-14 ENCOUNTER — Ambulatory Visit: Payer: Medicaid Other | Admitting: Registered"

## 2016-10-09 ENCOUNTER — Encounter (HOSPITAL_COMMUNITY): Payer: Self-pay | Admitting: Emergency Medicine

## 2016-10-09 ENCOUNTER — Emergency Department (HOSPITAL_COMMUNITY)
Admission: EM | Admit: 2016-10-09 | Discharge: 2016-10-09 | Disposition: A | Discharge status: Home / Self Care | Payer: Medicaid Other | Attending: Emergency Medicine | Admitting: Emergency Medicine

## 2016-10-09 DIAGNOSIS — J020 Streptococcal pharyngitis: Secondary | ICD-10-CM | POA: Diagnosis not present

## 2016-10-09 DIAGNOSIS — Z79899 Other long term (current) drug therapy: Secondary | ICD-10-CM | POA: Insufficient documentation

## 2016-10-09 DIAGNOSIS — F17210 Nicotine dependence, cigarettes, uncomplicated: Secondary | ICD-10-CM | POA: Insufficient documentation

## 2016-10-09 DIAGNOSIS — J029 Acute pharyngitis, unspecified: Secondary | ICD-10-CM | POA: Diagnosis present

## 2016-10-09 DIAGNOSIS — D649 Anemia, unspecified: Secondary | ICD-10-CM | POA: Diagnosis not present

## 2016-10-09 LAB — RAPID STREP SCREEN (MED CTR MEBANE ONLY): Streptococcus, Group A Screen (Direct): POSITIVE — AB

## 2016-10-09 MED ORDER — DEXAMETHASONE SODIUM PHOSPHATE 10 MG/ML IJ SOLN
10.0000 mg | Freq: Once | INTRAMUSCULAR | Status: AC
  Administered 2016-10-09: 10 mg via INTRAMUSCULAR
  Filled 2016-10-09: qty 1

## 2016-10-09 MED ORDER — AMOXICILLIN 500 MG PO CAPS: 500.0000 mg | ORAL_CAPSULE | Freq: Two times a day (BID) | ORAL | 0 refills | Status: DC

## 2016-10-09 NOTE — ED Provider Notes (Signed)
[facility] DEPT Provider Note   CSN: [id number] Arrival date & time: 10/09/16  4128  By signing my name below, I, Theresia Bough, attest that this documentation has been prepared under the direction and in the presence of non-physician practitioner, Volanda Napoleon., PA-C. Electronically Signed: Theresia Bough, ED Scribe. 10/09/16. 9:43 AM  History   Chief Complaint Chief Complaint  Patient presents with  . Sore Throat   The history is provided by the patient. No language interpreter was used.   HPI Comments: Carrie Douglas is a 41 y.o. female who presents to the Emergency Department complaining of a worsening,  persistent sore throat onset 5 days ago. Pt states pain is exacerbated by swallowing but pt is able to swallow and tolerate PO and her secretions. Pt reports associated mild cough and ear discomfort. No hearing changes. Pt has tried Mucinex, chloraseptic spray and Motrin with no relief of her symptoms. No contact with similar URI-like symptoms.  Pt is allergic to codeine. Pt denies fever, nausea, vomiting, cogestion or any other complaints at this time.  Past Medical History:  Diagnosis Date  . Anemia    blood transfusion 09/14/10  . Anxiety   . Arthritis    "knees" (07/02/2014)  . Depression   . 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 Iron deficiency anemia   . Rubella    5 months old  . Sleep apnea   . Vaginal delivery 1997, 1999, 2002, 2006, 2008, 2012  . Varicella    42 1/42 years old    Patient Active Problem List   Diagnosis Date Noted  . Iron deficiency anemia 09/16/2015  . Vitamin B12 deficiency 09/16/2015  . S/P gastric bypass 09/16/2015  . Vitamin D deficiency 09/16/2015  . Trigeminal neuralgia of right side of face 05/02/2015  . S/P laparoscopic assisted vaginal hysterectomy (LAVH) 09/17/2014  . Chest pain 07/02/2014  . Symptomatic anemia 07/02/2014  . Depression 07/02/2014  .  Pain in the chest   . Syncope and collapse 05/31/2013  . Abdominal pain, left lower quadrant 09/06/2012  . Anemia of chronic disease 09/06/2012  . 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 Viral enteritis 09/06/2012  . Nausea vomiting and diarrhea 09/06/2012  . Frequent UTI 12/15/2010    Past Surgical History:  Procedure Laterality Date  . CHOLECYSTECTOMY OPEN  2007  . DILATION AND CURETTAGE OF UTERUS  07/2013   "not a D&E"  . DILATION AND EVACUATION N/A 07/13/2013   Procedure: DILATATION AND EVACUATION;  Surgeon: Woodroe Mode, MD;  Location: [facility];  Service: Gynecology;  Laterality: N/A;  . LAPAROSCOPIC ASSISTED VAGINAL HYSTERECTOMY N/A 09/17/2014   Procedure: DIAGNOSTIC LAPAROSCOPIC, VAGINAL HYSTERECTOMY;  Surgeon: Osborne Oman, MD;  Location: [facility];  Service: Gynecology;  Laterality: N/A;  . LAPAROSCOPIC ASSISTED VAGINAL HYSTERECTOMY  2016   For AUB  . LAPAROSCOPIC BILATERAL SALPINGECTOMY Bilateral 10/10/2013   Procedure: LAPAROSCOPIC BILATERAL SALPINGECTOMY;  Surgeon: Osborne Oman, MD;  Location: [facility];  Service: Gynecology;  Laterality: Bilateral;  with single site port  . ROUX-EN-Y GASTRIC BYPASS  2004  . SALPINGOOPHORECTOMY Bilateral 08/2013    OB History    Gravida Para Term Preterm AB Living   6 6 6  0 0 6   SAB TAB Ectopic Multiple Live Births   0 0 0 0 6       Home Medications    Prior to Admission medications   Medication  Sig Start Date End Date Taking? Authorizing Provider  amoxicillin (AMOXIL) 500 MG capsule Take 1 capsule (500 mg total) by mouth 2 (two) times daily. 10/09/16   Volanda Napoleon, PA-C  calcium-vitamin D (OSCAL WITH D) 500-200 MG-UNIT per tablet Take 1 tablet by mouth daily with breakfast.    [provider]  carbamazepine (TEGRETOL) 200 MG tablet Take 1-2 tablets (200-400 mg total) by mouth 2 (two) times daily. 01/27/16   Penumalli, Earlean Polka, MD  diclofenac sodium (VOLTAREN) 1 % GEL Apply 2 g topically daily as needed (pain).      [provider]  ergocalciferol (VITAMIN D2) 50000 units capsule Take 1 tablet every week. 08/13/16   Heath Lark, MD  ibuprofen (ADVIL,MOTRIN) 200 MG tablet Take 400 mg by mouth every 6 (six) hours as needed for moderate pain.     [provider]  Multiple Vitamins-Minerals (MULTIVITAMIN ADULT PO) Take 1 tablet by mouth daily.    [provider]  phentermine (ADIPEX-P) 37.5 MG tablet Take 37.5 mg by mouth every morning. 02/12/16   [provider]    Family History Family History  Problem Relation Age of Onset  . Lung cancer Father   . Hypertension Father   . Heart disease Maternal Grandmother   . Heart disease Maternal Grandfather   . Heart disease Paternal Grandmother   . Heart disease Paternal Grandfather     Social History Social History  Substance Use Topics  . Smoking status: Current Every Day Smoker    Packs/day: 0.25    Years: 5.00    Types: Cigarettes  . Smokeless tobacco: Never Used     Comment: 04/30/15 10 cigs daily  . Alcohol use No     Allergies   Codeine and Other   Review of Systems Review of Systems  Constitutional: Negative for fever.  HENT: Positive for sore throat. Negative for congestion.   Respiratory: Positive for cough.   Gastrointestinal: Negative for nausea and vomiting.  All other systems reviewed and are negative.    Physical Exam Updated Vital Signs BP 118/87   Pulse 75   Temp 98 F (36.7 C)   Resp 18   Ht 5\' 5"  (1.651 m)   Wt 106.6 kg (235 lb)   LMP 08/22/2014 (Exact Date)   SpO2 95%   BMI 39.11 kg/m   Physical Exam  Constitutional: She appears well-developed and well-nourished.  Sitting comfortably on examination table  HENT:  Head: Normocephalic and atraumatic.  Right Ear: Tympanic membrane normal.  Left Ear: Tympanic membrane normal.  Mouth/Throat: No trismus in the jaw. Posterior oropharyngeal edema and posterior oropharyngeal erythema present. No oropharyngeal exudate or tonsillar  abscesses.  No evidence of peritonsillar abscess. No trismus. No facial or neck swelling. Bilateral TMs with normal anatomic landmarks and no evidence of effusion.   Eyes: Conjunctivae and EOM are normal. Right eye exhibits no discharge. Left eye exhibits no discharge. No scleral icterus.  Pulmonary/Chest: Effort normal.  Lymphadenopathy:    She has no cervical adenopathy.  Neurological: She is alert.  Skin: Skin is warm and dry.  Psychiatric: She has a normal mood and affect. Her speech is normal and behavior is normal.  Nursing note and vitals reviewed.    ED Treatments / Results  DIAGNOSTIC STUDIES: Oxygen Saturation is 95% on RA, adequate by my interpretation.   COORDINATION OF CARE: 9:32 AM-Discussed next steps with pt including treatment with antibiotics. Pt verbalized understanding and is agreeable with the plan.   Labs (  all labs ordered are listed, but only abnormal results are displayed) Labs Reviewed  RAPID STREP SCREEN (NOT AT [facility]) - Abnormal; Notable for the following:       Result Value   Streptococcus, Group A Screen (Direct) POSITIVE (*)    All other components within normal limits    EKG  EKG Interpretation None       Radiology No results found.  Procedures Procedures (including critical care time)  Medications Ordered in ED Medications  dexamethasone (DECADRON) injection 10 mg (not administered)     Initial Impression / Assessment and Plan / ED Course  I have reviewed the triage vital signs and the nursing notes.  Pertinent labs & imaging results that were available during my care of the patient were reviewed by me and considered in my medical decision making (see chart for details).     41 yo F with 5 days of sore throat. Patient is afebrile, non-toxic appearing, sitting comfortably on examination table. Vital signs reviewed and stable. Consider pharyngitis. Presentation not concerning for peritonsillar abscess or spread of infection to deep  spaces of the throat; patent airway. Rapid strep ordered at triage. Patient is tolerating PO but is concerned because of the swelling in her tonsils and the pain with swallowing. Discussed options with patient. Will give prednisone shot in the department for symptomatic relief.   Pt rapid strep test positive. Pt is tolerating secretions.  Pt will be discharged with Amoxicillin.  Specific return precautions discussed. Recommended PCP follow up. Patient expresses understanding and agreement to plan.   Final Clinical Impressions(s) / ED Diagnoses   Final diagnoses:  Strep pharyngitis    New Prescriptions New Prescriptions   AMOXICILLIN (AMOXIL) 500 MG CAPSULE    Take 1 capsule (500 mg total) by mouth 2 (two) times daily.   I personally performed the services described in this documentation, which was scribed in my presence. The recorded information has been reviewed and is accurate.      Volanda Napoleon, PA-C 10/09/16 7829    Margette Fast, MD 10/09/16 [phone]

## 2016-10-09 NOTE — Discharge Instructions (Addendum)
Take antibiotics as directed. Please take all of your antibiotics until finished.  You can take Tylenol or Ibuprofen as directed for pain.  Make sure you are staying hydrated and drinking plenty of fluids.   Follow-up with your primary care doctor in 24-48 hours.   Return to the Emergency Department for any persistent fever, vomiting, difficulty breathing, difficulty swallowing, SOB, and CP.

## 2016-11-19 ENCOUNTER — Ambulatory Visit: Payer: Medicaid Other

## 2016-11-19 ENCOUNTER — Other Ambulatory Visit: Payer: Medicaid Other

## 2016-11-26 ENCOUNTER — Ambulatory Visit: Payer: Medicaid Other

## 2016-11-26 ENCOUNTER — Telehealth: Payer: Self-pay | Admitting: Hematology and Oncology

## 2016-11-26 ENCOUNTER — Ambulatory Visit: Payer: Medicaid Other | Admitting: Hematology and Oncology

## 2016-11-26 NOTE — Telephone Encounter (Signed)
Spoke with pt regarding rescheduling her missed lab appt. She said that she will not know her work schedule until Monday, so she will give a call back to get that scheduled.

## 2016-12-16 ENCOUNTER — Telehealth: Payer: Self-pay | Admitting: Hematology and Oncology

## 2016-12-16 ENCOUNTER — Telehealth: Payer: Self-pay | Admitting: Diagnostic Neuroimaging

## 2016-12-16 NOTE — Telephone Encounter (Signed)
Spoke to Echelon at pharmacy and she has refill available to be picked up on Saturday. (360#).  Pt states she has enough to get by, but wanted to make sure she was able to get some until appt in January.

## 2016-12-16 NOTE — Telephone Encounter (Signed)
Patient called office requesting refill for carbamazepine (TEGRETOL) 200 MG tablet.  Follow up visit scheduled for 04/27/2017 with Dr. Leta Baptist.  [place].

## 2016-12-16 NOTE — Telephone Encounter (Signed)
Called patient to reschedule missed lab appt. Patient said she will have to call us back in a few weeks once she knows her work schedule because she is currently working 7-7.

## 2017-02-07 ENCOUNTER — Other Ambulatory Visit: Payer: Self-pay | Admitting: Diagnostic Neuroimaging

## 2017-02-07 ENCOUNTER — Other Ambulatory Visit: Payer: Self-pay | Admitting: *Deleted

## 2017-02-07 NOTE — Telephone Encounter (Signed)
Last fill done today.

## 2017-04-22 ENCOUNTER — Other Ambulatory Visit: Payer: Self-pay

## 2017-04-22 ENCOUNTER — Encounter (HOSPITAL_COMMUNITY): Payer: Self-pay | Admitting: *Deleted

## 2017-04-22 DIAGNOSIS — H9209 Otalgia, unspecified ear: Secondary | ICD-10-CM | POA: Diagnosis not present

## 2017-04-22 DIAGNOSIS — Z5321 Procedure and treatment not carried out due to patient leaving prior to being seen by health care provider: Secondary | ICD-10-CM | POA: Insufficient documentation

## 2017-04-22 DIAGNOSIS — R51 Headache: Secondary | ICD-10-CM | POA: Diagnosis not present

## 2017-04-22 MED ORDER — OXYCODONE-ACETAMINOPHEN 5-325 MG PO TABS
1.0000 | ORAL_TABLET | ORAL | Status: DC | PRN
  Administered 2017-04-22: 1 via ORAL
  Filled 2017-04-22: qty 1

## 2017-04-22 NOTE — ED Triage Notes (Signed)
PT states hx of trigeminal neuralgia.  States 3 episodes this week.  The 3rd episode started this am at 0800 and she took "Tregetol", which relieved it somewhat, but when she stepped outside in the cold, the pain became much more intense and it won't stop.

## 2017-04-23 ENCOUNTER — Emergency Department (HOSPITAL_COMMUNITY)
Admission: EM | Admit: 2017-04-23 | Discharge: 2017-04-23 | Disposition: A | Discharge status: Home / Self Care | Payer: Medicaid Other | Attending: Emergency Medicine | Admitting: Emergency Medicine

## 2017-04-23 HISTORY — DX: Trigeminal neuralgia: G50.0

## 2017-04-23 NOTE — ED Notes (Signed)
Called patient to be roomed, no answer.

## 2017-04-27 ENCOUNTER — Ambulatory Visit: Payer: Self-pay | Admitting: Diagnostic Neuroimaging

## 2017-06-28 ENCOUNTER — Ambulatory Visit: Payer: Medicaid Other | Admitting: Diagnostic Neuroimaging

## 2017-06-28 ENCOUNTER — Encounter: Payer: Self-pay | Admitting: Diagnostic Neuroimaging

## 2017-06-28 VITALS — BP 124/79 | HR 71 | Ht 66.0 in | Wt 242.2 lb

## 2017-06-28 DIAGNOSIS — G500 Trigeminal neuralgia: Secondary | ICD-10-CM | POA: Diagnosis not present

## 2017-06-28 MED ORDER — CARBAMAZEPINE 200 MG PO TABS: 200.0000 mg | ORAL_TABLET | Freq: Two times a day (BID) | ORAL | 4 refills | Status: DC

## 2017-06-28 NOTE — Patient Instructions (Signed)
-   continue carbamazepine

## 2017-06-28 NOTE — Progress Notes (Signed)
[facility]  PATIENT: Carrie Douglas DOB: 1975/11/20  REFERRING CLINICIAN:  HISTORY FROM: patient  REASON FOR VISIT: follow up   HISTORICAL  CHIEF COMPLAINT:  Chief Complaint  Patient presents with  . Follow-up  . Trigeminal Neuralgia (right)    episode last sunday (had ran out of medication).  Needs refill carbamazepine (90 day)    HISTORY OF PRESENT ILLNESS:   UPDATE (06/28/17, VRP): Since last visit, doing well. Tolerating CBZ (taking 100mg  in AM and 300mg  in PM). No alleviating or aggravating factors. Had some flare up of symptoms in Jan 2019 (no trigger) and March 2019 (ran out of meds).  UPDATE 01/27/16: Since last visit, doing well. Pain levels much less. Tolerating CBZ. Only 1-2 facial pain spasms, and those were less intense than previously.   UPDATE 07/29/15: Since last visit right face pain improved. CBZ doing well. Only 2-3 spasms of pain in last 3 months.   PRIOR HPI (04/30/15): 42 year old female here for evaluation of right ear pain. At age 20 years old patient had onset of intermittent pain around her ear, sometimes stabbing and shocking, sometimes associated with right parietal pain, right eyelid drooping, photophobia and phonophobia. Symptoms can last seconds or up to 2 minutes at a time. In 2013 she was evaluated by neurology and treated with carbamazepine which improved symptoms. In 2014 she stopped carbamazepine due to resolution of symptoms. In 2017 symptoms have returned. Patient having at least 2 attacks per week. No specific triggering or aggravating factors.   REVIEW OF SYSTEMS: Full 14 system review of systems performed and negative except for: headache muscle cramps joint pain.    ALLERGIES: Allergies  Allergen Reactions  . Codeine Nausea And Vomiting    Tylenol w/codeine makes her very sick.  . Other Itching    Burning Antibiotic for urinary tract infection:  Thinks cipro    HOME MEDICATIONS: Outpatient Medications Prior to  Visit  Medication Sig Dispense Refill  . calcium-vitamin D (OSCAL WITH D) 500-200 MG-UNIT per tablet Take 1 tablet by mouth daily with breakfast.    . carbamazepine (TEGRETOL) 200 MG tablet take 1 to 2 tablets by mouth twice a day 360 tablet 0  . diclofenac sodium (VOLTAREN) 1 % GEL Apply 2 g topically daily as needed (pain).     Marland Kitchen ergocalciferol (VITAMIN D2) 50000 units capsule Take 1 tablet every week. 12 capsule 0  . ibuprofen (ADVIL,MOTRIN) 200 MG tablet Take 400 mg by mouth every 6 (six) hours as needed for moderate pain.     . Multiple Vitamins-Minerals (MULTIVITAMIN ADULT PO) Take 1 tablet by mouth daily.    Marland Kitchen amoxicillin (AMOXIL) 500 MG capsule Take 1 capsule (500 mg total) by mouth 2 (two) times daily. (Patient not taking: Reported on 06/28/2017) 20 capsule 0  . phentermine (ADIPEX-P) 37.5 MG tablet Take 37.5 mg by mouth every morning.  0   No facility-administered medications prior to visit.     PAST MEDICAL HISTORY: Past Medical History:  Diagnosis Date  . Anemia    blood transfusion 09/14/10  . Anxiety   . Arthritis    "knees" (07/02/2014)  . Depression   . 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 Iron deficiency anemia   . Rubella    5 months old  . Sleep apnea   . Trigeminal neuralgia   . Vaginal delivery 1997, 1999, 2002, 2006, 2008, 2012  . Varicella  50 1/43 years old    PAST SURGICAL HISTORY: Past Surgical History:  Procedure Laterality Date  . ABDOMINAL HYSTERECTOMY    . CHOLECYSTECTOMY OPEN  2007  . DILATION AND CURETTAGE OF UTERUS  07/2013   "not a D&E"  . DILATION AND EVACUATION N/A 07/13/2013   Procedure: DILATATION AND EVACUATION;  Surgeon: Woodroe Mode, MD;  Location: [facility];  Service: Gynecology;  Laterality: N/A;  . LAPAROSCOPIC ASSISTED VAGINAL HYSTERECTOMY N/A 09/17/2014   Procedure: DIAGNOSTIC LAPAROSCOPIC, VAGINAL HYSTERECTOMY;  Surgeon: Osborne Oman, MD;  Location: [facility];   Service: Gynecology;  Laterality: N/A;  . LAPAROSCOPIC ASSISTED VAGINAL HYSTERECTOMY  2016   For AUB  . LAPAROSCOPIC BILATERAL SALPINGECTOMY Bilateral 10/10/2013   Procedure: LAPAROSCOPIC BILATERAL SALPINGECTOMY;  Surgeon: Osborne Oman, MD;  Location: [facility];  Service: Gynecology;  Laterality: Bilateral;  with single site port  . ROUX-EN-Y GASTRIC BYPASS  2004  . SALPINGOOPHORECTOMY Bilateral 08/2013    FAMILY HISTORY: Family History  Problem Relation Age of Onset  . Lung cancer Father   . Hypertension Father   . Heart disease Maternal Grandmother   . Heart disease Maternal Grandfather   . Heart disease Paternal Grandmother   . Heart disease Paternal Grandfather     SOCIAL HISTORY:  Social History   Socioeconomic History  . Marital status: Married    Spouse name: Aristeo  . Number of children: 5  . Years of education: 9  . Highest education level: Not on file  Social Needs  . Financial resource strain: Not on file  . Food insecurity - worry: Not on file  . Food insecurity - inability: Not on file  . Transportation needs - medical: Not on file  . Transportation needs - non-medical: Not on file  Occupational History    Comment: self employed translator  Tobacco Use  . Smoking status: Current Every Day Smoker    Packs/day: 0.25    Years: 5.00    Pack years: 1.25    Types: Cigarettes  . Smokeless tobacco: Never Used  . Tobacco comment: 04/30/15 10 cigs daily  Substance and Sexual Activity  . Alcohol use: No  . Drug use: No  . Sexual activity: Not Currently    Birth control/protection: Condom, Surgical    Comment: IUD removed in 2010, period method  Other Topics Concern  . Not on file  Social History Narrative   Husband and 5 children at home   Caffeine use- coffee 2 cups daily, diet pepsi     PHYSICAL EXAM  GENERAL EXAM/CONSTITUTIONAL: Vitals:  Vitals:   06/28/17 1036  BP: 124/79  Pulse: 71  Weight: 242 lb 3.2 oz (109.9 kg)  Height: 5\' 6"  (1.676 m)    Body mass index is 39.09 kg/m. No exam data present  Patient is in no distress; well developed, nourished and groomed; neck is supple  CARDIOVASCULAR:  Examination of carotid arteries is normal; no carotid bruits  Regular rate and rhythm, no murmurs  Examination of peripheral vascular system by observation and palpation is normal  EYES:  Ophthalmoscopic exam of optic discs and posterior segments is normal; no papilledema or hemorrhages  MUSCULOSKELETAL:  Gait, strength, tone, movements noted in Neurologic exam below  NEUROLOGIC: MENTAL STATUS:  No flowsheet data found.  awake, alert, oriented to person, place and time  recent and remote memory intact  normal attention and concentration  language fluent, comprehension intact, naming intact,   fund of knowledge appropriate  CRANIAL NERVE:   2nd -  no papilledema on fundoscopic exam  2nd, 3rd, 4th, 6th - pupils equal and reactive to light, visual fields full to confrontation, extraocular muscles intact, no nystagmus  5th - facial sensation symmetric  7th - facial strength symmetric  8th - hearing intact  9th - palate elevates symmetrically, uvula midline  11th - shoulder shrug symmetric  12th - tongue protrusion midline  MOTOR:   normal bulk and tone, full strength in the BUE, BLE  SENSORY:   normal and symmetric to light touch, pinprick, temperature, vibration    COORDINATION:   finger-nose-finger, fine finger movements normal  REFLEXES:   deep tendon reflexes present and symmetric  GAIT/STATION:   narrow based gait; SLIGHTLY ANTALGIC    DIAGNOSTIC DATA (LABS, IMAGING, TESTING) - I reviewed patient records, labs, notes, testing and imaging myself where available.  Lab Results  Component Value Date   WBC 6.2 08/12/2016   HGB 14.4 08/12/2016   HCT 43.6 08/12/2016   MCV 96.0 08/12/2016   PLT 189 08/12/2016      Component Value Date/Time   NA 137 09/10/2015 1317   NA 141  07/29/2015 1344   K 4.1 09/10/2015 1317   CL 109 09/10/2015 1317   CO2 22 09/10/2015 1317   GLUCOSE 132 (H) 09/10/2015 1317   BUN 10 09/10/2015 1317   BUN 11 07/29/2015 1344   CREATININE 0.65 09/10/2015 1317   CALCIUM 8.9 09/10/2015 1317   PROT 6.6 09/10/2015 1317   PROT 6.8 07/29/2015 1344   ALBUMIN 3.8 09/10/2015 1317   ALBUMIN 4.3 07/29/2015 1344   AST 15 09/10/2015 1317   ALT 9 (L) 09/10/2015 1317   ALKPHOS 68 09/10/2015 1317   BILITOT 0.1 (L) 09/10/2015 1317   BILITOT <0.2 07/29/2015 1344   GFRNONAA >60 09/10/2015 1317   GFRAA >60 09/10/2015 1317   No results found for: CHOL, HDL, LDLCALC, LDLDIRECT, TRIG, CHOLHDL No results found for: HGBA1C Lab Results  Component Value Date   VITAMINB12 250 05/13/2016   Lab Results  Component Value Date   TSH 1.224 08/07/2014    08/18/11 MRI brain - normal  08/18/11 MRA head - normal    ASSESSMENT AND PLAN  42 y.o. year old female here with intermittent right ear and head/scalp pain shock/stab symptoms since age 19 years old. Doing well on carbamazepine.   Dx: atypical trigeminal neuralgia  1. Trigeminal neuralgia of right side of face      PLAN:  I spent 15 minutes of face to face time with patient. Greater than 50% of time was spent in counseling and coordination of care with patient. In summary we discussed:  RIGHT TRIGEMINAL NEURALGIA - continue carbamazepine 200mg  twice a day (patient taking 100mg  in AM and 300mg  in PM) - CBC, CMP every 6-12 months per PCP  Meds ordered this encounter  Medications  . carbamazepine (TEGRETOL) 200 MG tablet    Sig: Take 1 tablet (200 mg total) by mouth 2 (two) times daily.    Dispense:  180 tablet    Refill:  4   Return in about 1 year (around 06/29/2018) for with NP.     Penni Bombard, MD 2/75/1700, 17:49 AM Certified in Neurology, Neurophysiology and [facility] [address] [place], [place] [address] [phone]

## 2018-06-28 NOTE — Progress Notes (Deleted)
PATIENT: Carrie Douglas DOB: June 15, 1975  REASON FOR VISIT: follow up HISTORY FROM: patient  No chief complaint on file.    HISTORY OF PRESENT ILLNESS: Today 06/28/18 Carrie Douglas is a 43 y.o. female here today for follow up for trigeminal neuralgia of right side of face.    HISTORY: (copied from Dr penumalli's note on 06/28/2017) UPDATE (06/28/17, VRP): Since last visit, doing well. Tolerating CBZ (taking 100mg  in AM and 300mg  in PM). No alleviating or aggravating factors. Had some flare up of symptoms in Jan 2019 (no trigger) and March 2019 (ran out of meds).  UPDATE 01/27/16: Since last visit, doing well. Pain levels much less. Tolerating CBZ. Only 1-2 facial pain spasms, and those were less intense than previously.   UPDATE 07/29/15: Since last visit right face pain improved. CBZ doing well. Only 2-3 spasms of pain in last 3 months.   PRIOR HPI (04/30/15): 43 year old female here for evaluation of right ear pain. At age 60 years old patient had onset of intermittent pain around her ear, sometimes stabbing and shocking, sometimes associated with right parietal pain, right eyelid drooping, photophobia and phonophobia. Symptoms can last seconds or up to 2 minutes at a time. In 2013 she was evaluated by neurology and treated with carbamazepine which improved symptoms. In 2014 she stopped carbamazepine due to resolution of symptoms. In 2017 symptoms have returned. Patient having at least 2 attacks per week. No specific triggering or aggravating factors.  REVIEW OF SYSTEMS: Out of a complete 14 system review of symptoms, the patient complains only of the following symptoms, and all other reviewed systems are negative.  ALLERGIES: Allergies  Allergen Reactions  . Codeine Nausea And Vomiting    Tylenol w/codeine makes her very sick.  . Other Itching    Burning Antibiotic for urinary tract infection:  Thinks cipro    HOME MEDICATIONS: Outpatient Medications Prior to Visit  Medication  Sig Dispense Refill  . calcium-vitamin D (OSCAL WITH D) 500-200 MG-UNIT per tablet Take 1 tablet by mouth daily with breakfast.    . carbamazepine (TEGRETOL) 200 MG tablet Take 1 tablet (200 mg total) by mouth 2 (two) times daily. 180 tablet 4  . diclofenac sodium (VOLTAREN) 1 % GEL Apply 2 g topically daily as needed (pain).     Marland Kitchen ergocalciferol (VITAMIN D2) 50000 units capsule Take 1 tablet every week. 12 capsule 0  . ibuprofen (ADVIL,MOTRIN) 200 MG tablet Take 400 mg by mouth every 6 (six) hours as needed for moderate pain.     . Multiple Vitamins-Minerals (MULTIVITAMIN ADULT PO) Take 1 tablet by mouth daily.     No facility-administered medications prior to visit.     PAST MEDICAL HISTORY: Past Medical History:  Diagnosis Date  . Anemia    blood transfusion 09/14/10  . Anxiety   . Arthritis    "knees" (07/02/2014)  . Depression   . 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 Iron deficiency anemia   . Rubella    5 months old  . Sleep apnea   . Trigeminal neuralgia   . Vaginal delivery 1997, 1999, 2002, 2006, 2008, 2012  . Varicella    58 1/43 years old    PAST SURGICAL HISTORY: Past Surgical History:  Procedure Laterality Date  . ABDOMINAL HYSTERECTOMY    . CHOLECYSTECTOMY OPEN  2007  . DILATION AND CURETTAGE OF UTERUS  07/2013   "not a D&E"  .  DILATION AND EVACUATION N/A 07/13/2013   Procedure: DILATATION AND EVACUATION;  Surgeon: Woodroe Mode, MD;  Location: [facility];  Service: Gynecology;  Laterality: N/A;  . LAPAROSCOPIC ASSISTED VAGINAL HYSTERECTOMY N/A 09/17/2014   Procedure: DIAGNOSTIC LAPAROSCOPIC, VAGINAL HYSTERECTOMY;  Surgeon: Osborne Oman, MD;  Location: [facility];  Service: Gynecology;  Laterality: N/A;  . LAPAROSCOPIC ASSISTED VAGINAL HYSTERECTOMY  2016   For AUB  . LAPAROSCOPIC BILATERAL SALPINGECTOMY Bilateral 10/10/2013   Procedure: LAPAROSCOPIC BILATERAL SALPINGECTOMY;  Surgeon: Osborne Oman, MD;  Location: [facility];  Service: Gynecology;  Laterality: Bilateral;  with single site port  . ROUX-EN-Y GASTRIC BYPASS  2004  . SALPINGOOPHORECTOMY Bilateral 08/2013    FAMILY HISTORY: Family History  Problem Relation Age of Onset  . Lung cancer Father   . Hypertension Father   . Heart disease Maternal Grandmother   . Heart disease Maternal Grandfather   . Heart disease Paternal Grandmother   . Heart disease Paternal Grandfather     SOCIAL HISTORY: Social History   Socioeconomic History  . Marital status: Married    Spouse name: Aristeo  . Number of children: 5  . Years of education: 9  . Highest education level: Not on file  Occupational History    Comment: self employed translator  Social Needs  . Financial resource strain: Not on file  . Food insecurity:    Worry: Not on file    Inability: Not on file  . Transportation needs:    Medical: Not on file    Non-medical: Not on file  Tobacco Use  . Smoking status: Current Every Day Smoker    Packs/day: 0.25    Years: 5.00    Pack years: 1.25    Types: Cigarettes  . Smokeless tobacco: Never Used  . Tobacco comment: 04/30/15 10 cigs daily  Substance and Sexual Activity  . Alcohol use: No  . Drug use: No  . Sexual activity: Not Currently    Birth control/protection: Condom, Surgical    Comment: IUD removed in 2010, period method  Lifestyle  . Physical activity:    Days per week: Not on file    Minutes per session: Not on file  . Stress: Not on file  Relationships  . Social connections:    Talks on phone: Not on file    Gets together: Not on file    Attends religious service: Not on file    Active member of club or organization: Not on file    Attends meetings of clubs or organizations: Not on file    Relationship status: Not on file  . Intimate partner violence:    Fear of current or ex partner: Not on file    Emotionally abused: Not on file    Physically abused: Not on file    Forced sexual  activity: Not on file  Other Topics Concern  . Not on file  Social History Narrative   Husband and 5 children at home   Caffeine use- coffee 2 cups daily, diet pepsi      PHYSICAL EXAM  There were no vitals filed for this visit. There is no height or weight on file to calculate BMI.  Generalized: Well developed, in no acute distress  Cardiology: normal rate and rhythm, no murmur noted Neurological examination  Mentation: Alert oriented to time, place, history taking. Follows all commands speech and language fluent Cranial nerve II-XII: Pupils were equal round reactive to light. Extraocular movements were full, visual field were full  on confrontational test. Facial sensation and strength were normal. Uvula tongue midline. Head turning and shoulder shrug  were normal and symmetric. Motor: The motor testing reveals 5 over 5 strength of all 4 extremities. Good symmetric motor tone is noted throughout.  Sensory: Sensory testing is intact to soft touch on all 4 extremities. No evidence of extinction is noted.  Coordination: Cerebellar testing reveals good finger-nose-finger and heel-to-shin bilaterally.  Gait and station: Gait is normal. Tandem gait is normal. Romberg is negative. No drift is seen.  Reflexes: Deep tendon reflexes are symmetric and normal bilaterally.   DIAGNOSTIC DATA (LABS, IMAGING, TESTING) - I reviewed patient records, labs, notes, testing and imaging myself where available.  No flowsheet data found.   Lab Results  Component Value Date   WBC 6.2 08/12/2016   HGB 14.4 08/12/2016   HCT 43.6 08/12/2016   MCV 96.0 08/12/2016   PLT 189 08/12/2016      Component Value Date/Time   NA 137 09/10/2015 1317   NA 141 07/29/2015 1344   K 4.1 09/10/2015 1317   CL 109 09/10/2015 1317   CO2 22 09/10/2015 1317   GLUCOSE 132 (H) 09/10/2015 1317   BUN 10 09/10/2015 1317   BUN 11 07/29/2015 1344   CREATININE 0.65 09/10/2015 1317   CALCIUM 8.9 09/10/2015 1317   PROT 6.6  09/10/2015 1317   PROT 6.8 07/29/2015 1344   ALBUMIN 3.8 09/10/2015 1317   ALBUMIN 4.3 07/29/2015 1344   AST 15 09/10/2015 1317   ALT 9 (L) 09/10/2015 1317   ALKPHOS 68 09/10/2015 1317   BILITOT 0.1 (L) 09/10/2015 1317   BILITOT <0.2 07/29/2015 1344   GFRNONAA >60 09/10/2015 1317   GFRAA >60 09/10/2015 1317   No results found for: CHOL, HDL, LDLCALC, LDLDIRECT, TRIG, CHOLHDL No results found for: HGBA1C Lab Results  Component Value Date   VITAMINB12 250 05/13/2016   Lab Results  Component Value Date   TSH 1.224 08/07/2014       ASSESSMENT AND PLAN 43 y.o. year old female  has a past medical history of Anemia, Anxiety, Arthritis, Depression, History of blood transfusion ("lots"), Iron deficiency anemia, Rubella, Sleep apnea, Trigeminal neuralgia, Vaginal delivery (1997, 1999, 2002, 2006, 2008, 2012), and Varicella. here with ***    ICD-10-CM   1. Trigeminal neuralgia of right side of face G50.0        No orders of the defined types were placed in this encounter.    No orders of the defined types were placed in this encounter.     I spent 15 minutes with the patient. 50% of this time was spent counseling and educating patient on plan of care and medications.    Debbora Presto, FNP-C 06/28/2018, 3:11 PM [facility] [address], [place], [place] [address] [phone]

## 2018-06-30 ENCOUNTER — Ambulatory Visit: Payer: Medicaid Other | Admitting: Nurse Practitioner

## 2018-07-03 ENCOUNTER — Telehealth: Payer: Self-pay

## 2018-07-03 NOTE — Telephone Encounter (Signed)
Spoke with the patient and she stated that currently she does not have any health insurance. So she has declined a telephone visit. She stated that she would call our office to reschedule once she has a update on her insurance she would call and reschedule her appointment.

## 2018-07-04 ENCOUNTER — Ambulatory Visit: Payer: Medicaid Other | Admitting: Diagnostic Neuroimaging

## 2018-07-04 ENCOUNTER — Ambulatory Visit: Payer: Medicaid Other | Admitting: Family Medicine

## 2019-03-18 ENCOUNTER — Other Ambulatory Visit: Payer: Self-pay

## 2019-03-18 ENCOUNTER — Emergency Department (HOSPITAL_COMMUNITY)
Admission: EM | Admit: 2019-03-18 | Discharge: 2019-03-18 | Disposition: A | Discharge status: Home / Self Care | Payer: BLUE CROSS/BLUE SHIELD | Attending: Emergency Medicine | Admitting: Emergency Medicine

## 2019-03-18 ENCOUNTER — Encounter (HOSPITAL_COMMUNITY): Payer: Self-pay | Admitting: Emergency Medicine

## 2019-03-18 ENCOUNTER — Emergency Department (HOSPITAL_COMMUNITY): Payer: BLUE CROSS/BLUE SHIELD

## 2019-03-18 DIAGNOSIS — M778 Other enthesopathies, not elsewhere classified: Secondary | ICD-10-CM

## 2019-03-18 DIAGNOSIS — M25512 Pain in left shoulder: Secondary | ICD-10-CM | POA: Diagnosis present

## 2019-03-18 DIAGNOSIS — Z79899 Other long term (current) drug therapy: Secondary | ICD-10-CM | POA: Diagnosis not present

## 2019-03-18 DIAGNOSIS — X500XXA Overexertion from strenuous movement or load, initial encounter: Secondary | ICD-10-CM | POA: Insufficient documentation

## 2019-03-18 DIAGNOSIS — F17210 Nicotine dependence, cigarettes, uncomplicated: Secondary | ICD-10-CM | POA: Diagnosis not present

## 2019-03-18 DIAGNOSIS — M7532 Calcific tendinitis of left shoulder: Secondary | ICD-10-CM | POA: Insufficient documentation

## 2019-03-18 MED ORDER — IBUPROFEN 600 MG PO TABS: 600.0000 mg | ORAL_TABLET | Freq: Four times a day (QID) | ORAL | 0 refills | Status: AC | PRN

## 2019-03-18 NOTE — ED Triage Notes (Signed)
Patient reports worsening left shoulder joint pain /stiffness with limited ROM  onset this week , denies injury , pain increases with movement or changing positions .

## 2019-03-18 NOTE — Discharge Instructions (Signed)
Please use Tylenol or ibuprofen for pain.  You may use 600 mg ibuprofen every 6 hours or 1000 mg of Tylenol every 6 hours.  You may choose to alternate between the 2.  This would be most effective.  Not to exceed 4 g of Tylenol within 24 hours.  Not to exceed 3200 mg ibuprofen 24 hours.  

## 2019-03-18 NOTE — ED Provider Notes (Addendum)
[facility] EMERGENCY DEPARTMENT Provider Note   CSN: [id number] Arrival date & time: 03/18/19  [id number]     History   Chief Complaint Chief Complaint  Patient presents with  . Shoulder Pain    HPI Carrie Douglas is a 43 y.o. female no signifcant past medical history      HPI  Patient is 43 year old female presented today with 5 days of left shoulder pain that is worse with movement, achy, worse with the palpation moderately alleviated with Aleve.  Patient states that she has done some hefty lifting recently due to moved.  States that she is worsening pain in the left shoulder after moving heavy boxes recently.  Denies any exertional chest pain.  Denies any shortness of breath, nausea, diaphoresis.  States that she is right-handed.  Has had some difficulty with overhead movements and lifting.  Denies any surgeries on shoulder previously.  Denies any medical problems.  Denies diabetes.   Past Medical History:  Diagnosis Date  . Anemia    blood transfusion 09/14/10  . Anxiety   . Arthritis    "knees" (07/02/2014)  . Depression   . 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 Iron deficiency anemia   . Rubella    5 months old  . Sleep apnea   . Trigeminal neuralgia   . Vaginal delivery 1997, 1999, 2002, 2006, 2008, 2012  . Varicella    52 1/44 years old    Patient Active Problem List   Diagnosis Date Noted  . Iron deficiency anemia 09/16/2015  . Vitamin B12 deficiency 09/16/2015  . S/P gastric bypass 09/16/2015  . Vitamin D deficiency 09/16/2015  . Trigeminal neuralgia of right side of face 05/02/2015  . S/P laparoscopic assisted vaginal hysterectomy (LAVH) 09/17/2014  . Chest pain 07/02/2014  . Symptomatic anemia 07/02/2014  . Depression 07/02/2014  . Pain in the chest   . Syncope and collapse 05/31/2013  . Abdominal pain, left lower quadrant 09/06/2012  . Anemia of chronic disease  09/06/2012  . 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 Viral enteritis 09/06/2012  . Nausea vomiting and diarrhea 09/06/2012  . Frequent UTI 12/15/2010    Past Surgical History:  Procedure Laterality Date  . ABDOMINAL HYSTERECTOMY    . CHOLECYSTECTOMY OPEN  2007  . DILATION AND CURETTAGE OF UTERUS  07/2013   "not a D&E"  . DILATION AND EVACUATION N/A 07/13/2013   Procedure: DILATATION AND EVACUATION;  Surgeon: Woodroe Mode, MD;  Location: [facility];  Service: Gynecology;  Laterality: N/A;  . LAPAROSCOPIC ASSISTED VAGINAL HYSTERECTOMY N/A 09/17/2014   Procedure: DIAGNOSTIC LAPAROSCOPIC, VAGINAL HYSTERECTOMY;  Surgeon: Osborne Oman, MD;  Location: [facility];  Service: Gynecology;  Laterality: N/A;  . LAPAROSCOPIC ASSISTED VAGINAL HYSTERECTOMY  2016   For AUB  . LAPAROSCOPIC BILATERAL SALPINGECTOMY Bilateral 10/10/2013   Procedure: LAPAROSCOPIC BILATERAL SALPINGECTOMY;  Surgeon: Osborne Oman, MD;  Location: [facility];  Service: Gynecology;  Laterality: Bilateral;  with single site port  . ROUX-EN-Y GASTRIC BYPASS  2004  . SALPINGOOPHORECTOMY Bilateral 08/2013     OB History    Gravida  6   Para  6   Term  6   Preterm  0   AB  0   Living  6     SAB  0   TAB  0   Ectopic  0   Multiple  0   Live  Births  6            Home Medications    Prior to Admission medications   Medication Sig Start Date End Date Taking? Authorizing Provider  calcium-vitamin D (OSCAL WITH D) 500-200 MG-UNIT per tablet Take 1 tablet by mouth daily with breakfast.    [provider]  carbamazepine (TEGRETOL) 200 MG tablet Take 1 tablet (200 mg total) by mouth 2 (two) times daily. 06/28/17   Penumalli, Earlean Polka, MD  diclofenac sodium (VOLTAREN) 1 % GEL Apply 2 g topically daily as needed (pain).     [provider]  ergocalciferol (VITAMIN D2) 50000 units capsule Take 1 tablet every week. 08/13/16   Heath Lark, MD  ibuprofen (ADVIL,MOTRIN) 200 MG tablet Take 400 mg by mouth  every 6 (six) hours as needed for moderate pain.     [provider]  Multiple Vitamins-Minerals (MULTIVITAMIN ADULT PO) Take 1 tablet by mouth daily.    [provider]    Family History Family History  Problem Relation Age of Onset  . Lung cancer Father   . Hypertension Father   . Heart disease Maternal Grandmother   . Heart disease Maternal Grandfather   . Heart disease Paternal Grandmother   . Heart disease Paternal Grandfather     Social History Social History   Tobacco Use  . Smoking status: Current Every Day Smoker    Packs/day: 0.25    Years: 5.00    Pack years: 1.25    Types: Cigarettes  . Smokeless tobacco: Never Used  . Tobacco comment: 04/30/15 10 cigs daily  Substance Use Topics  . Alcohol use: No  . Drug use: No     Allergies   Codeine and Other   Review of Systems Review of Systems  Constitutional: Negative for chills and fever.  HENT: Negative for congestion.   Eyes: Negative for pain.  Respiratory: Negative for cough and shortness of breath.   Cardiovascular: Negative for chest pain and leg swelling.  Gastrointestinal: Negative for abdominal pain and vomiting.  Genitourinary: Negative for dysuria.  Musculoskeletal: Positive for arthralgias. Negative for joint swelling, myalgias, neck pain and neck stiffness.       Left shoulder pain  Skin: Negative for rash.  Neurological: Negative for dizziness and headaches.     Physical Exam Updated Vital Signs BP 115/84 (BP Location: Left Arm)   Pulse 79   Temp 98.4 F (36.9 C) (Oral)   Resp 14   Ht 5\' 6"  (1.676 m)   Wt 102.5 kg   LMP 08/22/2014 (Exact Date)   SpO2 96%   BMI 36.48 kg/m   Physical Exam Vitals signs and nursing note reviewed.  Constitutional:      General: She is not in acute distress.    Appearance: Normal appearance. She is not ill-appearing.  HENT:     Head: Normocephalic and atraumatic.  Eyes:     General: No scleral icterus.       Right eye: No  discharge.        Left eye: No discharge.     Conjunctiva/sclera: Conjunctivae normal.  Neck:     Musculoskeletal: Normal range of motion.     Comments: No midline neck or back tenderness to palpation.  No step-off deformity no bruising Cardiovascular:     Pulses: Normal pulses.     Comments: Radial pulses intact capillary refill less than 2 seconds. Pulmonary:     Effort: Pulmonary effort is normal.  Breath sounds: No stridor.  Musculoskeletal:        General: Tenderness present. No swelling or deformity.     Comments: Left shoulder with full passive range of motion.  Active range of motion limited secondary to pain able to abduct completely abduct 90 degrees with intact flexion extension.  Flexion against resistance elicits pain 4/5 strength.  Negative Yergason.  Grip strength equal bilaterally.  Flexion extension of elbow 5/5.  Tenderness to palpation over lateral deltoid and anterior shoulder inferior to the [facility] joint.  Neurological:     Mental Status: She is alert and oriented to person, place, and time. Mental status is at baseline.     Comments: Sensation intact and symmetric bilateral upper extremities.      ED Treatments / Results  Labs (all labs ordered are listed, but only abnormal results are displayed) Labs Reviewed - No data to display  EKG None  Radiology Dg Shoulder Left  Result Date: 03/18/2019 CLINICAL DATA:  Worsening left shoulder pain and limited range of motion for 1 week. No known injury. EXAM: LEFT SHOULDER - 2+ VIEW COMPARISON:  None. FINDINGS: There is no evidence of fracture or dislocation. There is no evidence of arthropathy or other focal bone abnormality. Mild soft tissue calcification in the region of the distal rotator cuff tendon noted, which can be seen with calcific tendinitis. IMPRESSION: No acute findings. Mild distal rotator cuff tendon calcification, which can be seen with calcific tendinitis. Electronically Signed   By: Marlaine Hind M.D.    On: 03/18/2019 08:00    Procedures Procedures (including critical care time)  Medications Ordered in ED Medications - No data to display   Initial Impression / Assessment and Plan / ED Course  I have reviewed the triage vital signs and the nursing notes.  Pertinent labs & imaging results that were available during my care of the patient were reviewed by me and considered in my medical decision making (see chart for details).        Patient presents today with left shoulder pain that is worse with movement after recently moving heavy boxes.  Denies any chest pain shortness of breath nausea diaphoresis or other symptoms concerning for cardiac etiology.  Has no cardiac history is not diabetic does not have hypertension or hyperlipidemia.  Low suspicion for cardiac or pulmonary cause of her symptoms.  X-ray shows calcific tendinitis.  Suspect that this is etiology of her pain.  Recommended ibuprofen and follow-up with orthopedics for evaluation as she may benefit from a subacromial steroid injection.     This patient appears reasonably screened and I doubt any other medical condition requiring further workup, evaluation, or treatment in the ED at this time prior to discharge.   Patient's vitals are WNL apart from vital sign abnormalities discussed above, patient is in NAD, and able to ambulate in the ED at their baseline. Pain has been managed or a plan has been made for home management and has no complaints prior to discharge. Patient is comfortable with above plan and is stable for discharge at this time. All questions were answered prior to disposition. Results from the ER workup discussed with the patient face to face and all questions answered to the best of my ability. The patient is safe for discharge with strict return precautions. Patient appears safe for discharge with appropriate follow-up. Conveyed my impression with the patient and they voiced understanding and are agreeable to  plan.   An After Visit Summary was printed  and given to the patient.  Portions of this note were generated with [employer]. Dictation errors may occur despite best attempts at proofreading.    Final Clinical Impressions(s) / ED Diagnoses   Final diagnoses:  Tendinitis of left shoulder    ED Discharge Orders    None       Tedd Sias, [place] 03/18/19 0830    Pati Gallo [place], [place] 03/18/19 [id number]    Wyvonnia Dusky, MD 03/18/19 2145

## 2020-01-25 ENCOUNTER — Telehealth (HOSPITAL_COMMUNITY): Payer: Self-pay | Admitting: Emergency Medicine

## 2020-01-25 ENCOUNTER — Other Ambulatory Visit: Payer: Self-pay

## 2020-01-25 ENCOUNTER — Ambulatory Visit (HOSPITAL_COMMUNITY)
Admission: EM | Admit: 2020-01-25 | Discharge: 2020-01-25 | Disposition: A | Discharge status: Home / Self Care | Payer: Medicaid Other | Attending: Emergency Medicine | Admitting: Emergency Medicine

## 2020-01-25 ENCOUNTER — Encounter (HOSPITAL_COMMUNITY): Payer: Self-pay | Admitting: Emergency Medicine

## 2020-01-25 DIAGNOSIS — R42 Dizziness and giddiness: Secondary | ICD-10-CM | POA: Diagnosis not present

## 2020-01-25 LAB — COMPREHENSIVE METABOLIC PANEL
ALT: 16 U/L (ref 0–44)
AST: 18 U/L (ref 15–41)
Albumin: 4.2 g/dL (ref 3.5–5.0)
Alkaline Phosphatase: 54 U/L (ref 38–126)
Anion gap: 10 (ref 5–15)
BUN: 13 mg/dL (ref 6–20)
CO2: 22 mmol/L (ref 22–32)
Calcium: 9.3 mg/dL (ref 8.9–10.3)
Chloride: 108 mmol/L (ref 98–111)
Creatinine, Ser: 0.73 mg/dL (ref 0.44–1.00)
GFR, Estimated: >60 mL/min (ref >60)
Glucose, Bld: 81 mg/dL (ref 70–99)
Potassium: 4.5 mmol/L (ref 3.5–5.1)
Sodium: 140 mmol/L (ref 135–145)
Total Bilirubin: 0.8 mg/dL (ref 0.3–1.2)
Total Protein: 6.8 g/dL (ref 6.5–8.1)

## 2020-01-25 LAB — CBC WITH DIFFERENTIAL/PLATELET
Abs Immature Granulocytes: 0.03 10*3/uL (ref 0.00–0.07)
Basophils Absolute: 0 10*3/uL (ref 0.0–0.1)
Basophils Relative: 0 %
Eosinophils Absolute: 0.2 10*3/uL (ref 0.0–0.5)
Eosinophils Relative: 3 %
HCT: 45 % (ref 36.0–46.0)
Hemoglobin: 14.7 g/dL (ref 12.0–15.0)
Immature Granulocytes: 0 %
Lymphocytes Relative: 38 %
Lymphs Abs: 3 10*3/uL (ref 0.7–4.0)
MCH: 30.9 pg (ref 26.0–34.0)
MCHC: 32.7 g/dL (ref 30.0–36.0)
MCV: 94.7 fL (ref 80.0–100.0)
Monocytes Absolute: 0.5 10*3/uL (ref 0.1–1.0)
Monocytes Relative: 6 %
Neutro Abs: 4.2 10*3/uL (ref 1.7–7.7)
Neutrophils Relative %: 53 %
Platelets: 183 10*3/uL (ref 150–400)
RBC: 4.75 MIL/uL (ref 3.87–5.11)
RDW: 12.6 % (ref 11.5–15.5)
WBC: 7.9 10*3/uL (ref 4.0–10.5)
nRBC: 0 % (ref 0.0–0.2)

## 2020-01-25 MED ORDER — DIPHENHYDRAMINE HCL 25 MG PO TABS: 25.0000 mg | ORAL_TABLET | Freq: Four times a day (QID) | ORAL | 0 refills | Status: AC | PRN

## 2020-01-25 MED ORDER — MECLIZINE HCL 50 MG PO TABS: 50.0000 mg | ORAL_TABLET | Freq: Three times a day (TID) | ORAL | 0 refills | Status: DC | PRN

## 2020-01-25 NOTE — ED Triage Notes (Signed)
Patient c/o "dizzy spells" x 2 weeks.  Patient stated episodes are getting worst. Patient stated yesterday patient had episode when attempting to get out of vehicle when she became diaphoretic and felt extremly dizzy.    Patient states she attempted to increase water intake w/o any relief of symptoms.

## 2020-01-25 NOTE — Discharge Instructions (Signed)
Anticipate a call from regarding labs ordered during this urgent care encounter.

## 2020-01-25 NOTE — Telephone Encounter (Signed)
Patient's labs were reassuring.  H&H was within reference range.  Patient was discharged with Benadryl and meclizine for likely benign positional vertigo.

## 2020-01-25 NOTE — ED Provider Notes (Signed)
Emergency Department Provider Note  ____________________________________________  Time seen: Approximately 7:41 PM  I have reviewed the triage vital signs and the nursing notes.   HISTORY  Chief Complaint No chief complaint on file.   Historian Patient     HPI Carrie Douglas is a 44 y.o. female with a history of anemia, presents to the emergency department with dizziness.  Patient states that she has noticed dizziness occurring from a sitting to a standing position.  She states that she was trying to stand up from a sitting position in her vehicle yesterday when she felt dizzy and out of sorts.  She states that she has been trying to increase her dehydration at home.  She denies current chest pain, chest tightness or abdominal pain.  She denies current headache.  She states that she has experienced episodes of dizziness in the past that were similar.  She denies fever and chills.  No associated rhinorrhea, nasal congestion or nonproductive cough.  She has had some tinnitus over the past several days.  No other alleviating measures have been attempted.   Past Medical History:  Diagnosis Date  . Anemia    blood transfusion 09/14/10  . Anxiety   . Arthritis    "knees" (07/02/2014)  . Depression   . 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 Iron deficiency anemia   . Rubella    5 months old  . Sleep apnea   . Trigeminal neuralgia   . Vaginal delivery 1997, 1999, 2002, 2006, 2008, 2012  . Varicella    85 1/44 years old     Immunizations up to date:  Yes.     Past Medical History:  Diagnosis Date  . Anemia    blood transfusion 09/14/10  . Anxiety   . Arthritis    "knees" (07/02/2014)  . Depression   . 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 Iron deficiency anemia   . Rubella    5 months old  . Sleep apnea   . Trigeminal neuralgia    . Vaginal delivery 1997, 1999, 2002, 2006, 2008, 2012  . Varicella    30 1/44 years old    Patient Active Problem List   Diagnosis Date Noted  . Iron deficiency anemia 09/16/2015  . Vitamin B12 deficiency 09/16/2015  . S/P gastric bypass 09/16/2015  . Vitamin D deficiency 09/16/2015  . Trigeminal neuralgia of right side of face 05/02/2015  . S/P laparoscopic assisted vaginal hysterectomy (LAVH) 09/17/2014  . Chest pain 07/02/2014  . Symptomatic anemia 07/02/2014  . Depression 07/02/2014  . Pain in the chest   . Syncope and collapse 05/31/2013  . Abdominal pain, left lower quadrant 09/06/2012  . Anemia of chronic disease 09/06/2012  . 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 Viral enteritis 09/06/2012  . Nausea vomiting and diarrhea 09/06/2012  . Frequent UTI 12/15/2010    Past Surgical History:  Procedure Laterality Date  . ABDOMINAL HYSTERECTOMY    . CHOLECYSTECTOMY OPEN  2007  . DILATION AND CURETTAGE OF UTERUS  07/2013   "not a D&E"  . DILATION AND EVACUATION N/A 07/13/2013   Procedure: DILATATION AND EVACUATION;  Surgeon: Woodroe Mode, MD;  Location: [facility];  Service: Gynecology;  Laterality: N/A;  . LAPAROSCOPIC ASSISTED VAGINAL HYSTERECTOMY N/A 09/17/2014   Procedure: DIAGNOSTIC LAPAROSCOPIC, VAGINAL HYSTERECTOMY;  Surgeon: Laray Anger  A Anyanwu, MD;  Location: [facility];  Service: Gynecology;  Laterality: N/A;  . LAPAROSCOPIC ASSISTED VAGINAL HYSTERECTOMY  2016   For AUB  . LAPAROSCOPIC BILATERAL SALPINGECTOMY Bilateral 10/10/2013   Procedure: LAPAROSCOPIC BILATERAL SALPINGECTOMY;  Surgeon: Osborne Oman, MD;  Location: [facility];  Service: Gynecology;  Laterality: Bilateral;  with single site port  . ROUX-EN-Y GASTRIC BYPASS  2004  . SALPINGOOPHORECTOMY Bilateral 08/2013    Prior to Admission medications   Medication Sig Start Date End Date Taking? Authorizing Provider  ergocalciferol (VITAMIN D2) 50000 units capsule Take 1 tablet every week. 08/13/16  Yes Gorsuch, Ernst Spell, MD   Multiple Vitamins-Minerals (MULTIVITAMIN ADULT PO) Take 1 tablet by mouth daily.   Yes [provider]  calcium-vitamin D (OSCAL WITH D) 500-200 MG-UNIT per tablet Take 1 tablet by mouth daily with breakfast.    [provider]  carbamazepine (TEGRETOL) 200 MG tablet Take 1 tablet (200 mg total) by mouth 2 (two) times daily. 06/28/17   Penumalli, Earlean Polka, MD  diclofenac sodium (VOLTAREN) 1 % GEL Apply 2 g topically daily as needed (pain).     [provider]    Allergies Codeine and Other  Family History  Problem Relation Age of Onset  . Lung cancer Father   . Hypertension Father   . Heart disease Maternal Grandmother   . Heart disease Maternal Grandfather   . Heart disease Paternal Grandmother   . Heart disease Paternal Grandfather     Social History Social History   Tobacco Use  . Smoking status: Current Every Day Smoker    Packs/day: 0.25    Years: 5.00    Pack years: 1.25    Types: Cigarettes  . Smokeless tobacco: Never Used  . Tobacco comment: 04/30/15 10 cigs daily  Vaping Use  . Vaping Use: Never used  Substance Use Topics  . Alcohol use: No  . Drug use: No     Review of Systems  Constitutional: No fever/chills Eyes:  No discharge ENT: No upper respiratory complaints. Respiratory: no cough. No SOB/ use of accessory muscles to breath Gastrointestinal:   No nausea, no vomiting.  No diarrhea.  No constipation. Musculoskeletal: Negative for musculoskeletal pain. Skin: Negative for rash, abrasions, lacerations, ecchymosis.    ____________________________________________   PHYSICAL EXAM:  VITAL SIGNS: ED Triage Vitals  Enc Vitals Group     BP 01/25/20 1647 135/77     Pulse Rate 01/25/20 1647 72     Resp 01/25/20 1647 16     Temp 01/25/20 1647 97.9 F (36.6 C)     Temp Source 01/25/20 1647 Oral     SpO2 01/25/20 1647 98 %     Weight 01/25/20 1645 225 lb 15.5 oz (102.5 kg)     Height 01/25/20 1645 5\' 6"  (1.676 m)     Head  Circumference --      Peak Flow --      Pain Score 01/25/20 1645 0     Pain Loc --      Pain Edu? --      Excl. in [place]? --      Constitutional: Alert and oriented. Well appearing and in no acute distress. Eyes: Conjunctivae are normal. PERRL. EOMI. Head: Atraumatic. ENT:      Ears: TMs are effused bilaterally.       Nose: No congestion/rhinnorhea.      Mouth/Throat: Mucous membranes are moist.  Neck: No stridor.  No cervical spine tenderness to palpation. Cardiovascular: Normal rate, regular  rhythm. Normal S1 and S2.  Good peripheral circulation. Respiratory: Normal respiratory effort without tachypnea or retractions. Lungs CTAB. Good air entry to the bases with no decreased or absent breath sounds Gastrointestinal: Bowel sounds x 4 quadrants. Soft and nontender to palpation. No guarding or rigidity. No distention. Musculoskeletal: Full range of motion to all extremities. No obvious deformities noted Neurologic:  Normal for age. No gross focal neurologic deficits are appreciated.  Patient can perform hand to nose and rapid alternating movements.  Negative Romberg. Skin:  Skin is warm, dry and intact. No rash noted. Psychiatric: Mood and affect are normal for age. Speech and behavior are normal.   ____________________________________________   LABS (all labs ordered are listed, but only abnormal results are displayed)  Labs Reviewed  CBC WITH DIFFERENTIAL/PLATELET  COMPREHENSIVE METABOLIC PANEL   ____________________________________________  EKG   ____________________________________________  RADIOLOGY   No results found.  ____________________________________________    PROCEDURES  Procedure(s) performed:     Procedures     Medications - No data to display   ____________________________________________   INITIAL IMPRESSION / ASSESSMENT AND PLAN / ED COURSE  Pertinent labs & imaging results that were available during my care of the patient were  reviewed by me and considered in my medical decision making (see chart for details).      Assessment and plan: Dizziness 44 year old female presents to the emergency department with dizziness for the past 2 weeks.  Vital signs were reassuring at triage.  On physical exam, patient was alert, active and nontoxic-appearing.  Neuro exam was without acute deficits.  EKG revealed normal sinus rhythm without ST segment elevation or apparent arrhythmia.  Differential diagnosis included benign positional vertigo, symptomatic anemia, electrolyte abnormality, arrhythmia...  CBC and CMP were reassuring.  Patient was discharged with meclizine and Benadryl for likely benign positional vertigo.  Return precautions were given to return with new or worsening symptoms.     ____________________________________________  FINAL CLINICAL IMPRESSION(S) / ED DIAGNOSES  Final diagnoses:  Dizziness      NEW MEDICATIONS STARTED DURING THIS VISIT:  ED Discharge Orders    None          This chart was dictated using voice recognition software/Dragon. Despite best efforts to proofread, errors can occur which can change the meaning. Any change was purely unintentional.     Lannie Fields, PA-C 01/25/20 2101

## 2021-08-20 ENCOUNTER — Emergency Department (HOSPITAL_COMMUNITY)
Admission: EM | Admit: 2021-08-20 | Discharge: 2021-08-20 | Disposition: A | Discharge status: Home / Self Care | Payer: Medicaid Other | Attending: Emergency Medicine | Admitting: Emergency Medicine

## 2021-08-20 ENCOUNTER — Emergency Department (HOSPITAL_COMMUNITY): Payer: Medicaid Other

## 2021-08-20 ENCOUNTER — Encounter (HOSPITAL_COMMUNITY): Payer: Self-pay | Admitting: Emergency Medicine

## 2021-08-20 ENCOUNTER — Other Ambulatory Visit: Payer: Self-pay

## 2021-08-20 DIAGNOSIS — Y990 Civilian activity done for income or pay: Secondary | ICD-10-CM | POA: Diagnosis not present

## 2021-08-20 DIAGNOSIS — S71112A Laceration without foreign body, left thigh, initial encounter: Secondary | ICD-10-CM | POA: Diagnosis not present

## 2021-08-20 DIAGNOSIS — S81812A Laceration without foreign body, left lower leg, initial encounter: Secondary | ICD-10-CM | POA: Insufficient documentation

## 2021-08-20 DIAGNOSIS — Z23 Encounter for immunization: Secondary | ICD-10-CM | POA: Insufficient documentation

## 2021-08-20 DIAGNOSIS — W268XXA Contact with other sharp object(s), not elsewhere classified, initial encounter: Secondary | ICD-10-CM | POA: Diagnosis not present

## 2021-08-20 DIAGNOSIS — S8992XA Unspecified injury of left lower leg, initial encounter: Secondary | ICD-10-CM | POA: Diagnosis present

## 2021-08-20 MED ORDER — TETANUS-DIPHTH-ACELL PERTUSSIS 5-2.5-18.5 LF-MCG/0.5 IM SUSY
0.5000 mL | PREFILLED_SYRINGE | Freq: Once | INTRAMUSCULAR | Status: AC
  Administered 2021-08-20: 0.5 mL via INTRAMUSCULAR
  Filled 2021-08-20: qty 0.5

## 2021-08-20 MED ORDER — LIDOCAINE HCL (PF) 1 % IJ SOLN
10.0000 mL | Freq: Once | INTRAMUSCULAR | Status: DC
  Filled 2021-08-20: qty 10

## 2021-08-20 NOTE — ED Notes (Signed)
Pt ambulatory to waiting room. Pt verbalized understanding of discharge instructions.   

## 2021-08-20 NOTE — Discharge Instructions (Signed)
Return if any problems.  Suture removal in 8 days  ?

## 2021-08-20 NOTE — ED Notes (Signed)
Lidocaine and suture cart at bedside.  

## 2021-08-20 NOTE — ED Triage Notes (Signed)
Pt was at work cutting open a box and grazed her left thigh.  Has it dressed.  Bleeding controlled.  Needs tetanus as well.  ?

## 2021-08-20 NOTE — ED Provider Notes (Signed)
?[place] ?Provider Note ? ? ?CSN: [id number] ?Arrival date & time: 08/20/21  0020 ? ?  ? ?History ? ?Chief Complaint  ?Patient presents with  ? Laceration  ? ? ?Carrie Douglas is a 46 y.o. female. ? ?Pt cut her leg with a box cutter. Pt complains of a laceration to her leg.  ? ?The history is provided by the patient. No language interpreter was used.  ?Laceration ?Location:  Toe ?Length:  2 ?Time since incident:  1 hour ?Pain details:  ?  Quality:  Aching ?  Severity:  Moderate ?  Timing:  Constant ?Relieved by:  Nothing ?Ineffective treatments:  None tried ? ?  ? ?Home Medications ?Prior to Admission medications   ?Medication Sig Start Date End Date Taking? Authorizing Provider  ?calcium-vitamin D (OSCAL WITH D) 500-200 MG-UNIT per tablet Take 1 tablet by mouth daily with breakfast.    [provider]  ?carbamazepine (TEGRETOL) 200 MG tablet Take 1 tablet (200 mg total) by mouth 2 (two) times daily. 06/28/17   Penumalli, Earlean Polka, MD  ?diclofenac sodium (VOLTAREN) 1 % GEL Apply 2 g topically daily as needed (pain).     [provider]  ?diphenhydrAMINE (BENADRYL) 25 MG tablet Take 1 tablet (25 mg total) by mouth every 6 (six) hours as needed. 01/25/20   Lannie Fields, PA-C  ?ergocalciferol (VITAMIN D2) 50000 units capsule Take 1 tablet every week. 08/13/16   Heath Lark, MD  ?meclizine (ANTIVERT) 50 MG tablet Take 1 tablet (50 mg total) by mouth 3 (three) times daily as needed. 01/25/20   Lannie Fields, PA-C  ?Multiple Vitamins-Minerals (MULTIVITAMIN ADULT PO) Take 1 tablet by mouth daily.    [provider]  ?   ? ?Allergies    ?Codeine and Other   ? ?Review of Systems   ?Review of Systems  ?Skin:  Positive for wound.  ?All other systems reviewed and are negative. ? ?Physical Exam ?Updated Vital Signs ?BP 126/77 (BP Location: Left Arm)   Pulse 73   Temp 97.9 ?F (36.6 ?C)   Resp 16   LMP 08/22/2014 (Exact Date)   SpO2 97%  ?Physical  Exam ?Vitals reviewed.  ?Skin: ?   Comments: 2.1 cm laceration left leg  from nv and ns intact  ?Neurological:  ?   General: No focal deficit present.  ?   Mental Status: She is alert.  ?Psychiatric:     ?   Mood and Affect: Mood normal.  ? ? ?ED Results / Procedures / Treatments   ?Labs ?(all labs ordered are listed, but only abnormal results are displayed) ?Labs Reviewed - No data to display ? ?EKG ?None ? ?Radiology ?DG Femur Min 2 Views Left ? ?Result Date: 08/20/2021 ?CLINICAL DATA:  Laceration at work, evaluate for foreign body. EXAM: LEFT FEMUR 2 VIEWS COMPARISON:  None Available. FINDINGS: There is no evidence of fracture or other focal bone lesions. Soft tissues are unremarkable. IMPRESSION: Negative. Electronically Signed   By: Jorje Guild M.D.   On: 08/20/2021 04:17   ? ?Procedures ?Marland Kitchen.Laceration Repair ? ?Date/Time: 08/20/2021 8:11 AM ?Performed by: Fransico Meadow, PA-C ?Authorized by: Fransico Meadow, PA-C  ? ?Consent:  ?  Consent obtained:  Verbal ?  Consent given by:  Patient ?  Risks, benefits, and alternatives were discussed: yes   ?  Risks discussed:  Pain ?Universal protocol:  ?  Procedure explained and questions answered to patient or proxy's satisfaction: no   ?  Immediately prior to procedure, a time out was called: yes   ?  Patient identity confirmed:  Verbally with patient ?Laceration details:  ?  Location:  Leg ?  Length (cm):  2 ?Exploration:  ?  Limited defect created (wound extended): no   ?  Wound exploration: wound explored through full range of motion   ?  Contaminated: no   ?Treatment:  ?  Area cleansed with:  Povidone-iodine ?  Visualized foreign bodies/material removed: no   ?  Debridement:  None ?  Scar revision: no   ?Skin repair:  ?  Repair method:  Sutures ?  Suture size:  5-0 ?  Suture material:  Chromic gut ?  Suture technique:  Simple interrupted ?  Number of sutures:  3 ?Repair type:  ?  Repair type:  Simple ?Post-procedure details:  ?  Dressing:  Adhesive bandage ?   Procedure completion:  Tolerated  ? ? ?Medications Ordered in ED ?Medications  ?lidocaine (PF) (XYLOCAINE) 1 % injection 10 mL (has no administration in time range)  ?Tdap (BOOSTRIX) injection 0.5 mL (0.5 mLs Intramuscular Given 08/20/21 0703)  ? ? ?ED Course/ Medical Decision Making/ A&P ?  ?                        ?Medical Decision Making ?Risk ?Prescription drug management. ? ? ? ? ? ? ? ? ? ? ?Final Clinical Impression(s) / ED Diagnoses ?Final diagnoses:  ?Laceration of left lower extremity, initial encounter  ? ? ?Rx / DC Orders ?ED Discharge Orders   ? ? None  ? ?  ?.avs ? ?  ?Fransico Meadow, [place] ?08/20/21 0813 ? ?  ?Lorelle Gibbs, DO ?08/20/21 1109 ? ?

## 2021-08-20 NOTE — ED Notes (Signed)
2 band aids placed over laceration.  ?

## 2022-10-10 ENCOUNTER — Encounter (HOSPITAL_COMMUNITY): Payer: Self-pay

## 2022-10-10 ENCOUNTER — Other Ambulatory Visit: Payer: Self-pay

## 2022-10-10 ENCOUNTER — Emergency Department (HOSPITAL_COMMUNITY)
Admission: EM | Admit: 2022-10-10 | Discharge: 2022-10-10 | Disposition: A | Discharge status: Home / Self Care | Payer: Medicaid Other | Attending: Emergency Medicine | Admitting: Emergency Medicine

## 2022-10-10 ENCOUNTER — Emergency Department (HOSPITAL_COMMUNITY): Payer: Medicaid Other

## 2022-10-10 DIAGNOSIS — R0602 Shortness of breath: Secondary | ICD-10-CM | POA: Diagnosis not present

## 2022-10-10 DIAGNOSIS — U071 COVID-19: Secondary | ICD-10-CM

## 2022-10-10 LAB — CBC
HCT: 39.5 % (ref 36.0–46.0)
Hemoglobin: 12.4 g/dL (ref 12.0–15.0)
MCH: 28.9 pg (ref 26.0–34.0)
MCHC: 31.4 g/dL (ref 30.0–36.0)
MCV: 92.1 fL (ref 80.0–100.0)
Platelets: 270 10*3/uL (ref 150–400)
RBC: 4.29 MIL/uL (ref 3.87–5.11)
RDW: 13.4 % (ref 11.5–15.5)
WBC: 7.2 10*3/uL (ref 4.0–10.5)
nRBC: 0 % (ref 0.0–0.2)

## 2022-10-10 LAB — BASIC METABOLIC PANEL
Anion gap: 8 (ref 5–15)
BUN: 12 mg/dL (ref 6–20)
CO2: 25 mmol/L (ref 22–32)
Calcium: 9.2 mg/dL (ref 8.9–10.3)
Chloride: 106 mmol/L (ref 98–111)
Creatinine, Ser: 0.78 mg/dL (ref 0.44–1.00)
GFR, Estimated: >60 mL/min (ref >60)
Glucose, Bld: 136 mg/dL — ABNORMAL HIGH (ref 70–99)
Potassium: 3.6 mmol/L (ref 3.5–5.1)
Sodium: 139 mmol/L (ref 135–145)

## 2022-10-10 LAB — SARS CORONAVIRUS 2 BY RT PCR: SARS Coronavirus 2 by RT PCR: POSITIVE — AB

## 2022-10-10 LAB — TROPONIN I (HIGH SENSITIVITY)
Troponin I (High Sensitivity): 4 ng/L (ref <18)
Troponin I (High Sensitivity): 4 ng/L (ref <18)

## 2022-10-10 MED ORDER — SODIUM CHLORIDE 0.9 % IV BOLUS
1000.0000 mL | Freq: Once | INTRAVENOUS | Status: AC
  Administered 2022-10-10: 1000 mL via INTRAVENOUS

## 2022-10-10 MED ORDER — ACETAMINOPHEN 500 MG PO TABS
1000.0000 mg | ORAL_TABLET | Freq: Once | ORAL | Status: AC
  Administered 2022-10-10: 1000 mg via ORAL
  Filled 2022-10-10: qty 2

## 2022-10-10 MED ORDER — ACETAMINOPHEN 500 MG PO TABS: 500.0000 mg | ORAL_TABLET | Freq: Four times a day (QID) | ORAL | 0 refills | Status: AC | PRN

## 2022-10-10 MED ORDER — BENZONATATE 100 MG PO CAPS: 100.0000 mg | ORAL_CAPSULE | Freq: Three times a day (TID) | ORAL | 0 refills | Status: DC

## 2022-10-10 MED ORDER — ONDANSETRON HCL 4 MG/2ML IJ SOLN
4.0000 mg | Freq: Once | INTRAMUSCULAR | Status: AC
  Administered 2022-10-10: 4 mg via INTRAVENOUS
  Filled 2022-10-10: qty 2

## 2022-10-10 NOTE — ED Provider Notes (Signed)
[place] EMERGENCY DEPARTMENT AT [facility] Provider Note   CSN: [id number] Arrival date & time: 10/10/22  1650     History  Chief Complaint  Patient presents with   Cough   Shortness of Breath   Emesis   Dizziness    Carrie Douglas is a 47 y.o. female.  The history is provided by the patient and medical records. No language interpreter was used.  Cough Associated symptoms: shortness of breath   Shortness of Breath Associated symptoms: cough and vomiting   Emesis Associated symptoms: cough   Dizziness Associated symptoms: shortness of breath and vomiting      47 year old female with history of anxiety, depression, sleep apnea, trigeminal neuralgia, rubella, presenting today with cold symptoms.  Patient report for the past week she has had cold symptoms.  Symptoms including recurrent nausea, vomiting, feeling dizzy, body aches, lightheadedness, cough productive with yellow sputum and overall not feeling well.  She does not endorse any significant shortness of breath no diarrhea no dysuria no rash.  She mentions several of her coworker has similar sickness within the past few weeks.  She felt chest congestion today prompting this ER visit.  She tries drinking plenty fluid, tea, and take over-the-counter medication without relief.  Patient does vape but denies alcohol abuse.  Home Medications Prior to Admission medications   Medication Sig Start Date End Date Taking? Authorizing Provider  calcium-vitamin D (OSCAL WITH D) 500-200 MG-UNIT per tablet Take 1 tablet by mouth daily with breakfast.    [provider]  carbamazepine (TEGRETOL) 200 MG tablet Take 1 tablet (200 mg total) by mouth 2 (two) times daily. 06/28/17   Penumalli, Glenford Bayley, MD  diclofenac sodium (VOLTAREN) 1 % GEL Apply 2 g topically daily as needed (pain).     [provider]  diphenhydrAMINE (BENADRYL) 25 MG tablet Take 1 tablet (25 mg total) by mouth every 6 (six) hours as needed.  01/25/20   Orvil Feil, PA-C  ergocalciferol (VITAMIN D2) 50000 units capsule Take 1 tablet every week. 08/13/16   Artis Delay, MD  meclizine (ANTIVERT) 50 MG tablet Take 1 tablet (50 mg total) by mouth 3 (three) times daily as needed. 01/25/20   Orvil Feil, PA-C  Multiple Vitamins-Minerals (MULTIVITAMIN ADULT PO) Take 1 tablet by mouth daily.    [provider]      Allergies    Codeine and Other    Review of Systems   Review of Systems  Respiratory:  Positive for cough and shortness of breath.   Gastrointestinal:  Positive for vomiting.  Neurological:  Positive for dizziness.  All other systems reviewed and are negative.   Physical Exam Updated Vital Signs BP (!) 149/90   Pulse 70   Temp 98.1 F (36.7 C)   Resp 18   Ht 5\' 6"  (1.676 m)   Wt 102.5 kg   LMP 08/22/2014 (Exact Date)   SpO2 99%   BMI 36.47 kg/m  Physical Exam Vitals and nursing note reviewed.  Constitutional:      General: She is not in acute distress.    Appearance: She is well-developed.  HENT:     Head: Atraumatic.  Eyes:     Conjunctiva/sclera: Conjunctivae normal.  Cardiovascular:     Rate and Rhythm: Normal rate and regular rhythm.     Pulses: Normal pulses.     Heart sounds: Normal heart sounds.  Pulmonary:     Effort: Pulmonary effort is normal.  Breath sounds: No wheezing, rhonchi or rales.  Abdominal:     Palpations: Abdomen is soft.     Tenderness: There is no abdominal tenderness.  Musculoskeletal:     Cervical back: Neck supple.     Right lower leg: No edema.     Left lower leg: No edema.  Skin:    Findings: No rash.  Neurological:     Mental Status: She is alert.  Psychiatric:        Mood and Affect: Mood normal.     ED Results / Procedures / Treatments   Labs (all labs ordered are listed, but only abnormal results are displayed) Labs Reviewed  SARS CORONAVIRUS 2 BY RT PCR - Abnormal; Notable for the following components:      Result Value   SARS  Coronavirus 2 by RT PCR POSITIVE (*)    All other components within normal limits  BASIC METABOLIC PANEL - Abnormal; Notable for the following components:   Glucose, Bld 136 (*)    All other components within normal limits  CBC  TROPONIN I (HIGH SENSITIVITY)  TROPONIN I (HIGH SENSITIVITY)    EKG None  Radiology DG Chest 2 View  Result Date: 10/10/2022 CLINICAL DATA:  Shortness of breath EXAM: CHEST - 2 VIEW COMPARISON:  Chest x-ray Sep 10, 2015 FINDINGS: The cardiomediastinal silhouette is unchanged in contour. No focal pulmonary opacity. No pleural effusion or pneumothorax. The visualized upper abdomen is unremarkable. No acute osseous abnormality. IMPRESSION: No active cardiopulmonary disease. Electronically Signed   By: Jacob Moores M.D.   On: 10/10/2022 18:12    Procedures Procedures    Medications Ordered in ED Medications  acetaminophen (TYLENOL) tablet 1,000 mg (1,000 mg Oral Given 10/10/22 1841)  sodium chloride 0.9 % bolus 1,000 mL (0 mLs Intravenous Stopped 10/10/22 1936)  ondansetron (ZOFRAN) injection 4 mg (4 mg Intravenous Given 10/10/22 1841)    ED Course/ Medical Decision Making/ A&P                             Medical Decision Making Amount and/or Complexity of Data Reviewed Labs: ordered. Radiology: ordered.  Risk OTC drugs. Prescription drug management.   BP 132/89   Pulse 61   Temp 98.1 F (36.7 C)   Resp 18   Ht 5\' 6"  (1.676 m)   Wt 102.5 kg   LMP 08/22/2014 (Exact Date)   SpO2 99%   BMI 36.47 kg/m   28:80 PM  47 year old female with history of anxiety, depression, sleep apnea, trigeminal neuralgia, rubella, presenting today with cold symptoms.  Patient report for the past week she has had cold symptoms.  Symptoms including recurrent nausea, vomiting, feeling dizzy, body aches, lightheadedness, cough productive with yellow sputum and overall not feeling well.  She does not endorse any significant shortness of breath no diarrhea no dysuria no  rash.  She mentions several of her coworker has similar sickness within the past few weeks.  She felt chest congestion today prompting this ER visit.  She tries drinking plenty fluid, tea, and take over-the-counter medication without relief.  Patient does vape but denies alcohol abuse.  On exam, well-appearing female laying in bed occasional cough but otherwise in no acute discomfort.  Heart with normal rate and rhythm, lungs clear to auscultation bilaterally abdomen is soft nontender, normal skin turgor no signs of dehydration.  Vital signs reviewed and overall reassuring no fever no hypoxia.  -Labs ordered, independently viewed and  interpreted by me.  Labs remarkable for covid positive -The patient was maintained on a cardiac monitor.  I personally viewed and interpreted the cardiac monitored which showed an underlying rhythm of: NSR -Imaging independently viewed and interpreted by me and I agree with radiologist's interpretation.  Result remarkable for CXR without acute changes -This patient presents to the ED for concern of cold sxs, this involves an extensive number of treatment options, and is a complaint that carries with it a high risk of complications and morbidity.  The differential diagnosis includes covid, flu, rsv, viral illness, pneumonia -Co morbidities that complicate the patient evaluation includes anemia, depression -Treatment includes IVF, tylenol, zofran -Reevaluation of the patient after these medicines showed that the patient improved -PCP office notes or outside notes reviewed -Escalation to admission/observation considered: patients feels much better, is comfortable with discharge, and will follow up with PCP -Prescription medication considered, patient comfortable with tessalon and tylenol -Social Determinant of Health considered which includes tobacco use         Final Clinical Impression(s) / ED Diagnoses Final diagnoses:  COVID-19 virus infection    Rx / DC  Orders ED Discharge Orders          Ordered    benzonatate (TESSALON) 100 MG capsule  Every 8 hours        10/10/22 2003    acetaminophen (TYLENOL) 500 MG tablet  Every 6 hours PRN        10/10/22 2003              Fayrene Helper, PA-C 10/10/22 2006    Lonell Grandchild, MD 10/11/22 1506

## 2022-10-10 NOTE — ED Notes (Signed)
Dc instructions and scripts reviewed with pt no questions or concerns at this time will follow up with pcp as needed. Pt declined wheelchair.

## 2022-10-10 NOTE — ED Triage Notes (Signed)
Pt c/o productive cough w/yellow mucous, vomiting and dizziness, bodyaches, lightheadedx1wk. SOB started today. Pt able to speak in complete sentences.

## 2022-10-10 NOTE — Discharge Instructions (Signed)
You have been diagnosed with COVID infection.  Fortunately your chest x-ray did not show any signs of pneumonia and your labs are otherwise reassuring.  Take Tessalon as needed for cough, take Tylenol or ibuprofen as needed for aches and pain, drink plenty of fluid, and return to work when your symptoms improved.  Since you are no longer having fever you are less likely to be contagious.

## 2022-12-10 ENCOUNTER — Other Ambulatory Visit: Payer: Self-pay

## 2022-12-10 ENCOUNTER — Encounter (HOSPITAL_COMMUNITY): Payer: Self-pay | Admitting: Emergency Medicine

## 2022-12-10 ENCOUNTER — Emergency Department (HOSPITAL_COMMUNITY)
Admission: EM | Admit: 2022-12-10 | Discharge: 2022-12-10 | Disposition: A | Discharge status: Home / Self Care | Payer: Medicaid Other | Attending: Emergency Medicine | Admitting: Emergency Medicine

## 2022-12-10 ENCOUNTER — Emergency Department (HOSPITAL_COMMUNITY): Payer: Medicaid Other

## 2022-12-10 DIAGNOSIS — M19031 Primary osteoarthritis, right wrist: Secondary | ICD-10-CM | POA: Diagnosis not present

## 2022-12-10 DIAGNOSIS — M67843 Other specified disorders of tendon, right hand: Secondary | ICD-10-CM | POA: Insufficient documentation

## 2022-12-10 DIAGNOSIS — M25531 Pain in right wrist: Secondary | ICD-10-CM

## 2022-12-10 DIAGNOSIS — M779 Enthesopathy, unspecified: Secondary | ICD-10-CM

## 2022-12-10 DIAGNOSIS — M778 Other enthesopathies, not elsewhere classified: Secondary | ICD-10-CM | POA: Diagnosis not present

## 2022-12-10 DIAGNOSIS — M67833 Other specified disorders of tendon, right wrist: Secondary | ICD-10-CM | POA: Diagnosis not present

## 2022-12-10 MED ORDER — NAPROXEN 500 MG PO TABS: 500.0000 mg | ORAL_TABLET | Freq: Two times a day (BID) | ORAL | 0 refills | Status: DC

## 2022-12-10 NOTE — ED Provider Notes (Signed)
[place] EMERGENCY DEPARTMENT AT [facility] Provider Note   CSN: [id number] Arrival date & time: 12/10/22  4098     History  Chief Complaint  Patient presents with   Arm Injury    Carrie Douglas is a 47 y.o. female.  The history is provided by the patient and medical records.  Arm Injury  47 year old female presenting to the ED with right wrist pain.  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 No numbness/weakness.  She is right hand dominant.  She did take some motrin PTA which helped a little.  No real pain if sitting still or not moving wrist/arm.   Home Medications Prior to Admission medications   Medication Sig Start Date End Date Taking? Authorizing Provider  acetaminophen (TYLENOL) 500 MG tablet Take 1 tablet (500 mg total) by mouth every 6 (six) hours as needed. 10/10/22   Fayrene Helper, PA-C  benzonatate (TESSALON) 100 MG capsule Take 1 capsule (100 mg total) by mouth every 8 (eight) hours. 10/10/22   Fayrene Helper, PA-C  calcium-vitamin D (OSCAL WITH D) 500-200 MG-UNIT per tablet Take 1 tablet by mouth daily with breakfast.    [provider]  carbamazepine (TEGRETOL) 200 MG tablet Take 1 tablet (200 mg total) by mouth 2 (two) times daily. 06/28/17   Penumalli, Glenford Bayley, MD  diclofenac sodium (VOLTAREN) 1 % GEL Apply 2 g topically daily as needed (pain).     [provider]  diphenhydrAMINE (BENADRYL) 25 MG tablet Take 1 tablet (25 mg total) by mouth every 6 (six) hours as needed. 01/25/20   Orvil Feil, PA-C  ergocalciferol (VITAMIN D2) 50000 units capsule Take 1 tablet every week. 08/13/16   Artis Delay, MD  meclizine (ANTIVERT) 50 MG tablet Take 1 tablet (50 mg total) by mouth 3 (three) times daily as needed. 01/25/20   Orvil Feil, PA-C  Multiple Vitamins-Minerals (MULTIVITAMIN ADULT PO) Take 1 tablet by mouth  daily.    [provider]      Allergies    Codeine and Other    Review of Systems   Review of Systems  Musculoskeletal:  Positive for arthralgias.  All other systems reviewed and are negative.   Physical Exam Updated Vital Signs BP 139/83 (BP Location: Left Arm)   Pulse 73   Temp 97.8 F (36.6 C) (Oral)   Resp 16   Ht 5\' 4"  (1.626 m)   Wt 104.3 kg   LMP 08/22/2014 (Exact Date)   SpO2 99%   BMI 39.48 kg/m   Physical Exam Vitals and nursing note reviewed.  Constitutional:      Appearance: She is well-developed.  HENT:     Head: Normocephalic and atraumatic.  Eyes:     Conjunctiva/sclera: Conjunctivae normal.     Pupils: Pupils are equal, round, and reactive to light.  Cardiovascular:     Rate and Rhythm: Normal rate and regular rhythm.     Heart sounds: Normal heart sounds.  Pulmonary:     Effort: Pulmonary effort is normal.     Breath sounds: Normal breath sounds.  Abdominal:     General: Bowel sounds are normal.     Palpations: Abdomen is soft.  Musculoskeletal:        General: Normal range of motion.     Cervical back: Normal range of motion.  Comments: Right wrist normal in appearance without noted swelling or deformity, there is pain noted with side-to-side deviation of the wrist as well as pronation and supination, radial pulse intact, normal sensation to the fingers  Skin:    General: Skin is warm and dry.  Neurological:     Mental Status: She is alert and oriented to person, place, and time.     ED Results / Procedures / Treatments   Labs (all labs ordered are listed, but only abnormal results are displayed) Labs Reviewed - No data to display  EKG None  Radiology DG Wrist Complete Right  Result Date: 12/10/2022 CLINICAL DATA:  Pain. Patient reports hurting wrist at work yesterday well pushing lenses at of eye glasses. Sharp pain since yesterday. EXAM: RIGHT WRIST - COMPLETE 3+ VIEW COMPARISON:  None Available. FINDINGS: There are no  signs of acute fracture or dislocation. Mild degenerative change identified at the basilar joint. Soft tissues are unremarkable. IMPRESSION: 1. No acute findings. 2. Mild basilar joint osteoarthritis. Electronically Signed   By: Signa Kell M.D.   On: 12/10/2022 05:41    Procedures Procedures    Medications Ordered in ED Medications - No data to display  ED Course/ Medical Decision Making/ A&P                                 Medical Decision Making Amount and/or Complexity of Data Reviewed Radiology: ordered and independent interpretation performed.  Risk Prescription drug management.   47 year old female here with right wrist pain.  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  She is right-hand dominant.  Right wrist without any noted swelling or deformity.  She does have pain with side-to-side deviation and pronation/supination.  Her hand is neuro vastly intact.  X-rays negative for any acute bony findings.  I suspect this is likely tendinitis.  This we will place in a wrist brace, start anti-inflammatories, and have her follow-up with hand surgery if symptoms persist.  Can return here for any new/acute changes.  Final Clinical Impression(s) / ED Diagnoses Final diagnoses:  Right wrist pain  Tendonitis    Rx / DC Orders ED Discharge Orders          Ordered    naproxen (NAPROSYN) 500 MG tablet  2 times daily        12/10/22 0626              Garlon Hatchet, PA-C 12/10/22 1610    Nicanor Alcon, April, MD 12/10/22 [phone]

## 2022-12-10 NOTE — Discharge Instructions (Addendum)
Your x-ray today did not show any fracture.  We suspect you likely have tendinitis from repetitive motion in your right hand. Wear wrist brace for now, start the naproxen.  You can take Tylenol with this if needed. You can follow-up with hand specialist if symptoms persist or begin to worsen. Return here for any new or acute changes.

## 2022-12-10 NOTE — ED Triage Notes (Signed)
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

## 2023-08-11 ENCOUNTER — Encounter (HOSPITAL_COMMUNITY): Payer: Self-pay

## 2023-08-11 ENCOUNTER — Ambulatory Visit (HOSPITAL_COMMUNITY)
Admission: EM | Admit: 2023-08-11 | Discharge: 2023-08-11 | Disposition: A | Discharge status: Home / Self Care | Attending: Family Medicine | Admitting: Family Medicine

## 2023-08-11 DIAGNOSIS — K529 Noninfective gastroenteritis and colitis, unspecified: Secondary | ICD-10-CM | POA: Diagnosis not present

## 2023-08-11 LAB — BASIC METABOLIC PANEL WITH GFR
Anion gap: 9 (ref 5–15)
BUN: 9 mg/dL (ref 6–20)
CO2: 22 mmol/L (ref 22–32)
Calcium: 8.9 mg/dL (ref 8.9–10.3)
Chloride: 107 mmol/L (ref 98–111)
Creatinine, Ser: 0.66 mg/dL (ref 0.44–1.00)
GFR, Estimated: >60 mL/min (ref >60)
Glucose, Bld: 94 mg/dL (ref 70–99)
Potassium: 4.2 mmol/L (ref 3.5–5.1)
Sodium: 138 mmol/L (ref 135–145)

## 2023-08-11 LAB — CBC
HCT: 38.6 % (ref 36.0–46.0)
Hemoglobin: 12.3 g/dL (ref 12.0–15.0)
MCH: 27.9 pg (ref 26.0–34.0)
MCHC: 31.9 g/dL (ref 30.0–36.0)
MCV: 87.5 fL (ref 80.0–100.0)
Platelets: 217 10*3/uL (ref 150–400)
RBC: 4.41 MIL/uL (ref 3.87–5.11)
RDW: 12.6 % (ref 11.5–15.5)
WBC: 6.9 10*3/uL (ref 4.0–10.5)
nRBC: 0 % (ref 0.0–0.2)

## 2023-08-11 MED ORDER — ONDANSETRON 4 MG PO TBDP: 4.0000 mg | ORAL_TABLET | Freq: Three times a day (TID) | ORAL | 0 refills | Status: AC | PRN

## 2023-08-11 MED ORDER — ONDANSETRON 4 MG PO TBDP
4.0000 mg | ORAL_TABLET | Freq: Once | ORAL | Status: AC
  Administered 2023-08-11: 4 mg via ORAL

## 2023-08-11 MED ORDER — ONDANSETRON 4 MG PO TBDP
ORAL_TABLET | ORAL | Status: AC
  Filled 2023-08-11: qty 1

## 2023-08-11 NOTE — ED Triage Notes (Signed)
 Patient here today with c/o body aches, fever, fatigue, and weakness X 1 week. Patient vomited yesterday and the day before. Patient states that these last 3 days have been the worst. She has been taking Motrin  and Alka Seltzer cold and flu with some relief. Coworker have also been sick.

## 2023-08-11 NOTE — ED Provider Notes (Signed)
 [facility]    CSN: [id number] Arrival date & time: 08/11/23  0931      History   Chief Complaint Chief Complaint  Patient presents with   Generalized Body Aches    HPI Carrie Douglas is a 48 y.o. female.   HPI Here for nausea and vomiting that began on April 29.  She threw up several times on April 29th and 30th.  Today she is nauseated but has not had any more emesis, but she also has not eaten or drank anything.  No diarrhea.  Last bowel movement was April 27.  No dysuria or hematuria.  No cough or congestion.  She did have a temperature of 100.4 this morning early.  It did come down with some ibuprofen .  She did have some aching that began on April 25.  On April 26 she noted a headache.  She is allergic to codeine  Last menstrual cycle was in 2016; she has had a hysterectomy.  She feels quite weak.  Her left ear is hurting some.  No abdominal cramping or pain. 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   Vitamin D  deficiency 09/16/2015   Trigeminal neuralgia of right side of face 05/02/2015   S/P laparoscopic assisted vaginal hysterectomy (LAVH) 09/17/2014   Chest pain 07/02/2014   Symptomatic anemia 07/02/2014   Depression 07/02/2014   Pain in the chest    Syncope and collapse 05/31/2013   Abdominal pain, left lower quadrant 09/06/2012   Anemia of chronic disease 09/06/2012   Intussusception of jejunum (HCC)  09/06/2012   Viral enteritis 09/06/2012   Nausea vomiting and diarrhea 09/06/2012   Frequent UTI 12/15/2010    Past Surgical History:  Procedure Laterality Date   ABDOMINAL HYSTERECTOMY     CHOLECYSTECTOMY OPEN  2007   DILATION AND CURETTAGE OF UTERUS  07/2013   "not a D&E"   DILATION AND EVACUATION N/A 07/13/2013   Procedure: DILATATION AND EVACUATION;  Surgeon: Tresia Fruit, MD;  Location: [facility];  Service: Gynecology;  Laterality: N/A;   LAPAROSCOPIC ASSISTED VAGINAL HYSTERECTOMY N/A 09/17/2014   Procedure: DIAGNOSTIC LAPAROSCOPIC, VAGINAL HYSTERECTOMY;  Surgeon: Julianne Octave, MD;  Location: [facility];  Service: Gynecology;  Laterality: N/A;   LAPAROSCOPIC ASSISTED VAGINAL HYSTERECTOMY  2016   For AUB   LAPAROSCOPIC BILATERAL SALPINGECTOMY Bilateral 10/10/2013   Procedure: LAPAROSCOPIC BILATERAL SALPINGECTOMY;  Surgeon: Julianne Octave, MD;  Location: [facility];  Service: Gynecology;  Laterality: Bilateral;  with single site port   ROUX-EN-Y GASTRIC BYPASS  2004   SALPINGOOPHORECTOMY Bilateral 08/2013    OB History     Gravida  6   Para  6   Term  6   Preterm  0   AB  0   Living  6  SAB  0   IAB  0   Ectopic  0   Multiple  0   Live Births  6            Home Medications    Prior to Admission medications   Medication Sig Start Date End Date Taking? Authorizing Provider  ondansetron  (ZOFRAN -ODT) 4 MG disintegrating tablet Take 1 tablet (4 mg total) by mouth every 8 (eight) hours as needed for nausea or vomiting. 08/11/23  Yes Ann Keto, MD  acetaminophen  (TYLENOL ) 500 MG tablet Take 1 tablet (500 mg total) by mouth every 6 (six) hours as needed. 10/10/22   Debbra Fairy, PA-C  calcium -vitamin D  (OSCAL WITH D) 500-200 MG-UNIT per tablet Take 1 tablet by mouth daily with breakfast.    [provider]  diphenhydrAMINE  (BENADRYL ) 25 MG tablet Take 1 tablet (25 mg total) by mouth every 6 (six) hours as needed. 01/25/20   Woods, Jaclyn M, PA-C   ergocalciferol  (VITAMIN D2) 50000 units capsule Take 1 tablet every week. 08/13/16   Almeda Jacobs, MD  Multiple Vitamins-Minerals (MULTIVITAMIN ADULT PO) Take 1 tablet by mouth daily.    [provider]    Family History Family History  Problem Relation Age of Onset   Lung cancer Father    Hypertension Father    Heart disease Maternal Grandmother    Heart disease Maternal Grandfather    Heart disease Paternal Grandmother    Heart disease Paternal Grandfather     Social History Social History   Tobacco Use   Smoking status: Former    Current packs/day: 0.25    Average packs/day: 0.3 packs/day for 5.0 years (1.3 ttl pk-yrs)    Types: Cigarettes   Smokeless tobacco: Never   Tobacco comments:    04/30/15 10 cigs daily  Vaping Use   Vaping status: Every Day   Substances: Nicotine  Substance Use Topics   Alcohol use: No   Drug use: No     Allergies   Codeine and Other   Review of Systems Review of Systems   Physical Exam Triage Vital Signs ED Triage Vitals  Encounter Vitals Group     BP 08/11/23 1114 122/81     Systolic BP Percentile --      Diastolic BP Percentile --      Pulse Rate 08/11/23 1114 (!) 59     Resp 08/11/23 1114 16     Temp 08/11/23 1114 97.9 F (36.6 C)     Temp Source 08/11/23 1114 Oral     SpO2 08/11/23 1114 96 %     Weight 08/11/23 1115 225 lb (102.1 kg)     Height 08/11/23 1115 5\' 4"  (1.626 m)     Head Circumference --      Peak Flow --      Pain Score 08/11/23 1116 8     Pain Loc --      Pain Education --      Exclude from Growth Chart --    No data found.  Updated Vital Signs BP 122/81 (BP Location: Right Arm)   Pulse (!) 59   Temp 97.9 F (36.6 C) (Oral)   Resp 16   Ht 5\' 4"  (1.626 m)   Wt 102.1 kg   LMP 08/22/2014 (Exact Date)   SpO2 96%   BMI 38.62 kg/m   Visual Acuity Right Eye Distance:   Left Eye Distance:   Bilateral Distance:    Right Eye Near:   Left Eye Near:  Bilateral Near:     Physical  Exam Vitals reviewed.  Constitutional:      General: She is not in acute distress.    Appearance: She is not toxic-appearing.  HENT:     Right Ear: Ear canal normal.     Left Ear: Ear canal normal.     Ears:     Comments: Both tympanic membranes are slightly dull.  No erythema or bulging.    Nose: Nose normal.     Mouth/Throat:     Mouth: Mucous membranes are moist.     Pharynx: No oropharyngeal exudate or posterior oropharyngeal erythema.  Eyes:     Extraocular Movements: Extraocular movements intact.     Conjunctiva/sclera: Conjunctivae normal.     Pupils: Pupils are equal, round, and reactive to light.  Cardiovascular:     Rate and Rhythm: Normal rate and regular rhythm.     Heart sounds: No murmur heard. Pulmonary:     Effort: Pulmonary effort is normal. No respiratory distress.     Breath sounds: No stridor. No wheezing, rhonchi or rales.  Abdominal:     General: Bowel sounds are normal. There is no distension.     Palpations: Abdomen is soft.     Tenderness: There is no abdominal tenderness. There is no guarding.  Musculoskeletal:     Cervical back: Neck supple.  Lymphadenopathy:     Cervical: No cervical adenopathy.  Skin:    Capillary Refill: Capillary refill takes less than 2 seconds.     Coloration: Skin is not jaundiced or pale.  Neurological:     General: No focal deficit present.     Mental Status: She is alert and oriented to person, place, and time.  Psychiatric:        Behavior: Behavior normal.      UC Treatments / Results  Labs (all labs ordered are listed, but only abnormal results are displayed) Labs Reviewed  CBC  BASIC METABOLIC PANEL WITH GFR    EKG   Radiology No results found.  Procedures Procedures (including critical care time)  Medications Ordered in UC Medications  ondansetron  (ZOFRAN -ODT) disintegrating tablet 4 mg (has no administration in time range)    Initial Impression / Assessment and Plan / UC Course  I have  reviewed the triage vital signs and the nursing notes.  Pertinent labs & imaging results that were available during my care of the patient were reviewed by me and considered in my medical decision making (see chart for details).     I do think most likely this is a viral gastroenteritis.  I cannot tell that the aching and headache were actually related to this infection.  Since she feels so weak, CBC and BMP are drawn today we will notify her if anything significantly abnormal.  Zofran  is given here and Zofran  prescription sent to the pharmacy.  We discussed clear liquids and a bland diet. Final Clinical Impressions(s) / UC Diagnoses   Final diagnoses:  Gastroenteritis     Discharge Instructions      Ondansetron  dissolved in the mouth every 8 hours as needed for nausea or vomiting. Clear liquids(water, gatorade/pedialyte, ginger ale/sprite, chicken broth/soup) and bland things(crackers/toast, rice, potato, bananas) to eat. Avoid acidic foods like lemon/lime/orange/tomato, and avoid greasy/spicy foods.  We have given you 1 dose of this medication here in the clinic  We have drawn blood to check your electrolytes and sugar and blood count.  Staff will notify you if anything significantly abnormal  ED Prescriptions     Medication Sig Dispense Auth. Provider   ondansetron  (ZOFRAN -ODT) 4 MG disintegrating tablet Take 1 tablet (4 mg total) by mouth every 8 (eight) hours as needed for nausea or vomiting. 10 tablet Ellsworth Haas Cathyrn Deas K, MD      PDMP not reviewed this encounter.   Ann Keto, MD 08/11/23 [phone]

## 2023-08-11 NOTE — Discharge Instructions (Signed)
 Ondansetron  dissolved in the mouth every 8 hours as needed for nausea or vomiting. Clear liquids(water, gatorade/pedialyte, ginger ale/sprite, chicken broth/soup) and bland things(crackers/toast, rice, potato, bananas) to eat. Avoid acidic foods like lemon/lime/orange/tomato, and avoid greasy/spicy foods.  We have given you 1 dose of this medication here in the clinic  We have drawn blood to check your electrolytes and sugar and blood count.  Staff will notify you if anything significantly abnormal

## 2024-03-12 ENCOUNTER — Emergency Department (HOSPITAL_COMMUNITY)
Admission: EM | Admit: 2024-03-12 | Discharge: 2024-03-12 | Disposition: A | Discharge status: Home / Self Care | Attending: Emergency Medicine | Admitting: Emergency Medicine

## 2024-03-12 ENCOUNTER — Encounter (HOSPITAL_COMMUNITY): Payer: Self-pay | Admitting: Radiology

## 2024-03-12 ENCOUNTER — Emergency Department (HOSPITAL_COMMUNITY)

## 2024-03-12 ENCOUNTER — Other Ambulatory Visit: Payer: Self-pay

## 2024-03-12 DIAGNOSIS — R079 Chest pain, unspecified: Secondary | ICD-10-CM | POA: Insufficient documentation

## 2024-03-12 DIAGNOSIS — M25512 Pain in left shoulder: Secondary | ICD-10-CM | POA: Diagnosis not present

## 2024-03-12 DIAGNOSIS — R0789 Other chest pain: Secondary | ICD-10-CM | POA: Diagnosis not present

## 2024-03-12 DIAGNOSIS — R0981 Nasal congestion: Secondary | ICD-10-CM | POA: Diagnosis present

## 2024-03-12 DIAGNOSIS — J069 Acute upper respiratory infection, unspecified: Secondary | ICD-10-CM | POA: Insufficient documentation

## 2024-03-12 LAB — RESP PANEL BY RT-PCR (RSV, FLU A&B, COVID)  RVPGX2
Influenza A by PCR: NEGATIVE
Influenza B by PCR: NEGATIVE
Resp Syncytial Virus by PCR: NEGATIVE
SARS Coronavirus 2 by RT PCR: NEGATIVE

## 2024-03-12 LAB — BASIC METABOLIC PANEL WITH GFR
Anion gap: 9 (ref 5–15)
BUN: 15 mg/dL (ref 6–20)
CO2: 24 mmol/L (ref 22–32)
Calcium: 9.2 mg/dL (ref 8.9–10.3)
Chloride: 109 mmol/L (ref 98–111)
Creatinine, Ser: 0.97 mg/dL (ref 0.44–1.00)
GFR, Estimated: >60 mL/min (ref >60)
Glucose, Bld: 102 mg/dL — ABNORMAL HIGH (ref 70–99)
Potassium: 4.4 mmol/L (ref 3.5–5.1)
Sodium: 142 mmol/L (ref 135–145)

## 2024-03-12 LAB — CBC
HCT: 38.3 % (ref 36.0–46.0)
Hemoglobin: 11.7 g/dL — ABNORMAL LOW (ref 12.0–15.0)
MCH: 26.2 pg (ref 26.0–34.0)
MCHC: 30.5 g/dL (ref 30.0–36.0)
MCV: 85.9 fL (ref 80.0–100.0)
Platelets: 295 K/uL (ref 150–400)
RBC: 4.46 MIL/uL (ref 3.87–5.11)
RDW: 14 % (ref 11.5–15.5)
WBC: 8.3 K/uL (ref 4.0–10.5)
nRBC: 0 % (ref 0.0–0.2)

## 2024-03-12 LAB — TROPONIN I (HIGH SENSITIVITY)
Troponin I (High Sensitivity): <2 ng/L (ref <18)
Troponin I (High Sensitivity): <2 ng/L (ref <18)

## 2024-03-12 NOTE — ED Provider Notes (Signed)
 [place] EMERGENCY DEPARTMENT AT [facility] Provider Note   CSN: [id number] Arrival date & time: 03/12/24  1703     Patient presents with: Chest Pain   Carrie Douglas is a 48 y.o. female.  History of anemia, anxiety, depression, IDA, OSA, trigeminal neuralgia presenting with left shoulder pain and chest pain for 1 week.  History per patient.  Endorses she has been feeling more lightheaded today.  Denies cardiac history.  Denies fever, chills, nausea, vomiting, shortness of breath, abdominal pain, or back pain.  The patient endorses intermittently she will cough, and she is states that sometimes she feels congested.  She endorses that the left shoulder pain has been primarily worse over the past week, and then the symptoms radiated to her chest, with associated chest pressure over the past 3 days.  She is primarily concerned because her mother had a heart attack at 66, and she does not want to have a heart attack as well.    Chest Pain      Prior to Admission medications   Medication Sig Start Date End Date Taking? Authorizing Provider  acetaminophen  (TYLENOL ) 500 MG tablet Take 1 tablet (500 mg total) by mouth every 6 (six) hours as needed. 10/10/22   Nivia Colon, PA-C  calcium -vitamin D  (OSCAL WITH D) 500-200 MG-UNIT per tablet Take 1 tablet by mouth daily with breakfast.    [provider]  diphenhydrAMINE  (BENADRYL ) 25 MG tablet Take 1 tablet (25 mg total) by mouth every 6 (six) hours as needed. 01/25/20   Woods, Jaclyn M, PA-C  ergocalciferol  (VITAMIN D2) 50000 units capsule Take 1 tablet every week. 08/13/16   Lonn Hicks, MD  Multiple Vitamins-Minerals (MULTIVITAMIN ADULT PO) Take 1 tablet by mouth daily.    [provider]  ondansetron  (ZOFRAN -ODT) 4 MG disintegrating tablet Take 1 tablet (4 mg total) by mouth every 8 (eight) hours as needed for nausea or vomiting. 08/11/23   Vonna Sharlet POUR, MD    Allergies: Codeine and Other    Review of Systems   Cardiovascular:  Positive for chest pain.    Updated Vital Signs BP 126/82 (BP Location: Left Arm)   Pulse (!) 59   Temp 98.1 F (36.7 C) (Oral)   Resp 11   Ht 5' 4 (1.626 m)   Wt 98 kg   LMP 08/22/2014 (Exact Date)   SpO2 100%   BMI 37.08 kg/m   Physical Exam Vitals and nursing note reviewed.  Constitutional:      General: She is not in acute distress.    Appearance: She is well-developed.  HENT:     Head: Normocephalic and atraumatic.  Eyes:     Extraocular Movements: Extraocular movements intact.     Conjunctiva/sclera: Conjunctivae normal.     Pupils: Pupils are equal, round, and reactive to light.  Cardiovascular:     Rate and Rhythm: Normal rate and regular rhythm.     Pulses:          Carotid pulses are 2+ on the right side and 2+ on the left side.      Radial pulses are 2+ on the right side and 2+ on the left side.       Dorsalis pedis pulses are 2+ on the right side and 2+ on the left side.       Posterior tibial pulses are 2+ on the right side and 2+ on the left side.     Heart sounds: Normal heart sounds. No murmur heard.  No systolic murmur is present.     No diastolic murmur is present.  Pulmonary:     Effort: Pulmonary effort is normal. No respiratory distress.     Breath sounds: Normal breath sounds. No decreased breath sounds, wheezing, rhonchi or rales.  Abdominal:     Palpations: Abdomen is soft.     Tenderness: There is no abdominal tenderness. There is no guarding or rebound.  Musculoskeletal:        General: No swelling. Normal range of motion.     Cervical back: Normal range of motion and neck supple.     Right lower leg: No tenderness. No edema.     Left lower leg: No tenderness. No edema.     Comments: Mild TTP over the left pectoralis muscle.  Full ROM of left shoulder.  Skin:    General: Skin is warm and dry.     Capillary Refill: Capillary refill takes less than 2 seconds.  Neurological:     Mental Status: She is alert.   Psychiatric:        Mood and Affect: Mood normal.     (all labs ordered are listed, but only abnormal results are displayed) Labs Reviewed  BASIC METABOLIC PANEL WITH GFR - Abnormal; Notable for the following components:      Result Value   Glucose, Bld 102 (*)    All other components within normal limits  CBC - Abnormal; Notable for the following components:   Hemoglobin 11.7 (*)    All other components within normal limits  RESP PANEL BY RT-PCR (RSV, FLU A&B, COVID)  [id number]  TROPONIN I (HIGH SENSITIVITY)  TROPONIN I (HIGH SENSITIVITY)    EKG: EKG Interpretation Date/Time:  Monday March 12 2024 17:17:04 EST Ventricular Rate:  76 PR Interval:  158 QRS Duration:  78 QT Interval:  384 QTC Calculation: 432 R Axis:   43  Text Interpretation: Normal sinus rhythm Normal ECG When compared with ECG of 10-Oct-2022 16:44, PREVIOUS ECG IS PRESENT Confirmed by Tonia Chew [phone]) on 03/12/2024 7:01:39 PM  Radiology: [facility] Chest 2 View Result Date: 03/12/2024 EXAM: 2 VIEW(S) XRAY OF THE CHEST 03/12/2024 05:47:00 PM COMPARISON: Comparison is 10/10/2022. CLINICAL HISTORY: cp FINDINGS: LUNGS AND PLEURA: No focal pulmonary opacity. No pleural effusion. No pneumothorax. HEART AND MEDIASTINUM: No acute abnormality of the cardiac and mediastinal silhouettes. BONES AND SOFT TISSUES: Clothing artifact over the inferior right hemithorax. No acute osseous abnormality. IMPRESSION: 1. No acute cardiopulmonary process. Electronically signed by: Rockey Kilts MD 03/12/2024 06:34 PM EST RP Workstation: [id number]     Procedures   Medications Ordered in the ED - No data to display  Clinical Course as of 03/13/24 0036  Mon Mar 12, 2024  2221 Perc negative, low suspicion for dvt/pe [BS]    Clinical Course User Index [BS] Arlee Katz, MD                                 Medical Decision Making  Based on patient presentation, history, evaluation, high suspicion for chest pain likely in the  setting of pectoralis muscle strain without evidence of tear.  Patient overall with left shoulder pain, progressing to chest pain over 1 week.  Lab workup, imaging, and overall evaluation reassuring, low suspicion for ACS versus pneumonia versus pneumonitis versus pneumothorax versus rib fracture versus PE versus electrolyte versus AKI versus anemia.  Patient also with congestion, likely has a URI as well  which may be progressing from sensation of chest congestion, recommended to take Mucinex, as well as decongestions to help with chest pressure relief.  Overall, with reassuring workup today, and close follow-up with PCP, patient is overall stable for discharge.     Final diagnoses:  Chest pain, unspecified type  Upper respiratory tract infection, unspecified type    ED Discharge Orders     None          Arlee Katz, MD 03/13/24 GARRISON    Tonia Chew, MD 03/17/24 [phone]

## 2024-03-12 NOTE — ED Triage Notes (Signed)
 She endorses that for the past week she has had left shoulder pain and chest pain. She endorses that she has not lifted anything heavy. She states that today she has been a bit lightheaded. She has no cardiac history.

## 2024-03-12 NOTE — Discharge Instructions (Signed)
 I would recommend follow-up with your PCP, they can perform further workup to evaluate for appropriate cardiac health.  Your workup today is is overall reassuring.

## 2024-03-12 NOTE — ED Provider Triage Note (Signed)
 Emergency Medicine Provider Triage Evaluation Note  Carrie Douglas , a 48 y.o. female  was evaluated in triage.  Pt complains of chest pain. Recurrent chest pain, lighthead, nausea ongoing x 1 week.  Increasing stress.  No sob, no fever, productive cough, abd pain, back pain.  Does smoke, no significant cardiac hx  Review of Systems  Positive: As above Negative: As above  Physical Exam  BP (!) 146/83 (BP Location: Right Arm)   Pulse 68   Temp 98.2 F (36.8 C)   Resp 17   Ht 5' 4 (1.626 m)   Wt 98 kg   LMP 08/22/2014 (Exact Date)   SpO2 100%   BMI 37.08 kg/m  Gen:   Awake, no distress   Resp:  Normal effort  MSK:   Moves extremities without difficulty  Other:    Medical Decision Making  Medically screening exam initiated at 5:29 PM.  Appropriate orders placed.  Carrie Douglas was informed that the remainder of the evaluation will be completed by another provider, this initial triage assessment does not replace that evaluation, and the importance of remaining in the ED until their evaluation is complete.     Nivia Colon, PA-C 03/12/24 1729
